# Patient Record
Sex: Female | Born: 1960
Health system: Southern US, Community
[De-identification: ages and names within clinical notes are randomized; demographics above are authoritative.]

## PROBLEM LIST (undated history)

## (undated) DIAGNOSIS — M545 Low back pain, unspecified: Secondary | ICD-10-CM

## (undated) DIAGNOSIS — I10 Essential (primary) hypertension: Secondary | ICD-10-CM

## (undated) DIAGNOSIS — G709 Myoneural disorder, unspecified: Secondary | ICD-10-CM

## (undated) DIAGNOSIS — E669 Obesity, unspecified: Secondary | ICD-10-CM

## (undated) DIAGNOSIS — D649 Anemia, unspecified: Secondary | ICD-10-CM

## (undated) DIAGNOSIS — D219 Benign neoplasm of connective and other soft tissue, unspecified: Secondary | ICD-10-CM

## (undated) DIAGNOSIS — J45909 Unspecified asthma, uncomplicated: Secondary | ICD-10-CM

## (undated) DIAGNOSIS — F419 Anxiety disorder, unspecified: Secondary | ICD-10-CM

## (undated) DIAGNOSIS — G8929 Other chronic pain: Secondary | ICD-10-CM

## (undated) DIAGNOSIS — M1990 Unspecified osteoarthritis, unspecified site: Secondary | ICD-10-CM

## (undated) DIAGNOSIS — E119 Type 2 diabetes mellitus without complications: Secondary | ICD-10-CM

## (undated) HISTORY — DX: Anemia, unspecified: D64.9

## (undated) HISTORY — DX: Low back pain: M54.5

## (undated) HISTORY — DX: Benign neoplasm of connective and other soft tissue, unspecified: D21.9

## (undated) HISTORY — PX: PILONIDAL CYST EXCISION: SHX744

## (undated) HISTORY — PX: WISDOM TOOTH EXTRACTION: SHX21

## (undated) HISTORY — DX: Essential (primary) hypertension: I10

## (undated) HISTORY — DX: Anxiety disorder, unspecified: F41.9

## (undated) HISTORY — DX: Low back pain, unspecified: M54.50

## (undated) HISTORY — DX: Myoneural disorder, unspecified: G70.9

## (undated) HISTORY — DX: Obesity, unspecified: E66.9

## (undated) HISTORY — DX: Unspecified asthma, uncomplicated: J45.909

## (undated) HISTORY — DX: Other chronic pain: G89.29

---

## 1998-10-16 ENCOUNTER — Emergency Department (HOSPITAL_COMMUNITY): Admission: EM | Admit: 1998-10-16 | Discharge: 1998-10-16 | Payer: Self-pay | Admitting: Emergency Medicine

## 2008-07-10 ENCOUNTER — Emergency Department (HOSPITAL_BASED_OUTPATIENT_CLINIC_OR_DEPARTMENT_OTHER): Admission: EM | Admit: 2008-07-10 | Discharge: 2008-07-10 | Payer: Self-pay | Admitting: Emergency Medicine

## 2009-08-20 ENCOUNTER — Ambulatory Visit: Payer: Self-pay | Admitting: Family

## 2009-08-20 DIAGNOSIS — D649 Anemia, unspecified: Secondary | ICD-10-CM | POA: Insufficient documentation

## 2009-08-20 DIAGNOSIS — M65849 Other synovitis and tenosynovitis, unspecified hand: Secondary | ICD-10-CM

## 2009-08-20 DIAGNOSIS — M1710 Unilateral primary osteoarthritis, unspecified knee: Secondary | ICD-10-CM

## 2009-08-20 DIAGNOSIS — N938 Other specified abnormal uterine and vaginal bleeding: Secondary | ICD-10-CM | POA: Insufficient documentation

## 2009-08-20 DIAGNOSIS — M65839 Other synovitis and tenosynovitis, unspecified forearm: Secondary | ICD-10-CM | POA: Insufficient documentation

## 2009-08-20 DIAGNOSIS — N949 Unspecified condition associated with female genital organs and menstrual cycle: Secondary | ICD-10-CM

## 2009-08-20 DIAGNOSIS — E785 Hyperlipidemia, unspecified: Secondary | ICD-10-CM | POA: Insufficient documentation

## 2009-08-20 DIAGNOSIS — IMO0002 Reserved for concepts with insufficient information to code with codable children: Secondary | ICD-10-CM | POA: Insufficient documentation

## 2009-08-20 DIAGNOSIS — J309 Allergic rhinitis, unspecified: Secondary | ICD-10-CM | POA: Insufficient documentation

## 2009-08-20 DIAGNOSIS — J45909 Unspecified asthma, uncomplicated: Secondary | ICD-10-CM | POA: Insufficient documentation

## 2009-08-20 DIAGNOSIS — I10 Essential (primary) hypertension: Secondary | ICD-10-CM | POA: Insufficient documentation

## 2009-08-20 DIAGNOSIS — Z6841 Body Mass Index (BMI) 40.0 and over, adult: Secondary | ICD-10-CM

## 2009-08-27 ENCOUNTER — Ambulatory Visit: Payer: Self-pay | Admitting: Family

## 2009-08-27 ENCOUNTER — Ambulatory Visit (HOSPITAL_BASED_OUTPATIENT_CLINIC_OR_DEPARTMENT_OTHER): Admission: RE | Admit: 2009-08-27 | Discharge: 2009-08-27 | Payer: Self-pay | Admitting: Internal Medicine

## 2009-08-27 ENCOUNTER — Encounter: Payer: Self-pay | Admitting: Internal Medicine

## 2009-08-27 ENCOUNTER — Ambulatory Visit: Payer: Self-pay | Admitting: Diagnostic Radiology

## 2009-08-27 ENCOUNTER — Telehealth: Payer: Self-pay | Admitting: Family

## 2009-08-27 DIAGNOSIS — M79609 Pain in unspecified limb: Secondary | ICD-10-CM | POA: Insufficient documentation

## 2009-08-27 LAB — CONVERTED CEMR LAB
BUN: 13 mg/dL (ref 6–23)
Basophils Absolute: 0 10*3/uL (ref 0.0–0.1)
Basophils Relative: 0 % (ref 0–1)
Calcium: 9 mg/dL (ref 8.4–10.5)
Cholesterol: 210 mg/dL — ABNORMAL HIGH (ref 0–200)
Creatinine, Ser: 0.89 mg/dL (ref 0.40–1.20)
Eosinophils Absolute: 0.1 10*3/uL (ref 0.0–0.7)
Eosinophils Relative: 1 % (ref 0–5)
HCT: 33.1 % — ABNORMAL LOW (ref 36.0–46.0)
HDL: 68 mg/dL (ref >39)
LDL Cholesterol: 123 mg/dL — ABNORMAL HIGH (ref 0–99)
Lymphs Abs: 2.4 10*3/uL (ref 0.7–4.0)
MCV: 68.7 fL — ABNORMAL LOW (ref 78.0–100.0)
Neutrophils Relative %: 47 % (ref 43–77)
Platelets: 284 10*3/uL (ref 150–400)
RDW: 18.4 % — ABNORMAL HIGH (ref 11.5–15.5)
Triglycerides: 96 mg/dL (ref <150)
WBC: 6 10*3/uL (ref 4.0–10.5)

## 2009-08-28 ENCOUNTER — Telehealth: Payer: Self-pay | Admitting: Family

## 2009-08-28 ENCOUNTER — Encounter: Payer: Self-pay | Admitting: Family

## 2009-08-28 DIAGNOSIS — E1149 Type 2 diabetes mellitus with other diabetic neurological complication: Secondary | ICD-10-CM | POA: Insufficient documentation

## 2009-09-22 ENCOUNTER — Telehealth (INDEPENDENT_AMBULATORY_CARE_PROVIDER_SITE_OTHER): Payer: Self-pay | Admitting: *Deleted

## 2012-04-17 ENCOUNTER — Ambulatory Visit (INDEPENDENT_AMBULATORY_CARE_PROVIDER_SITE_OTHER): Payer: Self-pay | Admitting: Internal Medicine

## 2012-04-17 ENCOUNTER — Encounter: Payer: Self-pay | Admitting: Internal Medicine

## 2012-04-17 VITALS — BP 180/80 | HR 96 | Temp 98.8°F | Ht 66.5 in | Wt 276.0 lb

## 2012-04-17 DIAGNOSIS — M25561 Pain in right knee: Secondary | ICD-10-CM | POA: Insufficient documentation

## 2012-04-17 DIAGNOSIS — D649 Anemia, unspecified: Secondary | ICD-10-CM

## 2012-04-17 DIAGNOSIS — M25569 Pain in unspecified knee: Secondary | ICD-10-CM

## 2012-04-17 DIAGNOSIS — M79609 Pain in unspecified limb: Secondary | ICD-10-CM

## 2012-04-17 DIAGNOSIS — R7309 Other abnormal glucose: Secondary | ICD-10-CM

## 2012-04-17 DIAGNOSIS — I10 Essential (primary) hypertension: Secondary | ICD-10-CM

## 2012-04-17 DIAGNOSIS — M79672 Pain in left foot: Secondary | ICD-10-CM

## 2012-04-17 LAB — CBC WITH DIFFERENTIAL/PLATELET
Basophils Absolute: 0 10*3/uL (ref 0.0–0.1)
Basophils Relative: 0 % (ref 0.0–3.0)
Eosinophils Absolute: 0 10*3/uL (ref 0.0–0.7)
Lymphocytes Relative: 40.1 % (ref 12.0–46.0)
MCHC: 31.2 g/dL (ref 30.0–36.0)
Monocytes Relative: 11.6 % (ref 3.0–12.0)
Neutrophils Relative %: 47.7 % (ref 43.0–77.0)
RBC: 4.76 Mil/uL (ref 3.87–5.11)

## 2012-04-17 LAB — BASIC METABOLIC PANEL
BUN: 13 mg/dL (ref 6–23)
CO2: 27 mEq/L (ref 19–32)
Calcium: 9.1 mg/dL (ref 8.4–10.5)
Chloride: 107 mEq/L (ref 96–112)
Creatinine, Ser: 1 mg/dL (ref 0.4–1.2)
Glucose, Bld: 137 mg/dL — ABNORMAL HIGH (ref 70–99)

## 2012-04-17 LAB — LIPID PANEL
Total CHOL/HDL Ratio: 4
Triglycerides: 114 mg/dL (ref 0.0–149.0)

## 2012-04-17 LAB — HEMOGLOBIN A1C: Hgb A1c MFr Bld: 6.6 % — ABNORMAL HIGH (ref 4.6–6.5)

## 2012-04-17 LAB — TSH: TSH: 0.8 u[IU]/mL (ref 0.35–5.50)

## 2012-04-17 MED ORDER — LISINOPRIL 20 MG PO TABS: 20.0000 mg | ORAL_TABLET | Freq: Every day | ORAL | Status: DC

## 2012-04-17 MED ORDER — AMLODIPINE BESYLATE 5 MG PO TABS: 5.0000 mg | ORAL_TABLET | Freq: Every day | ORAL | Status: DC

## 2012-04-17 NOTE — Assessment & Plan Note (Addendum)
Screen for diabetes.  Patient counseled on diet and exercise.

## 2012-04-17 NOTE — Progress Notes (Signed)
Subjective:    Patient ID: Jordan Ramirez, female    DOB: [birth year], 51 y.o.   MRN: [id number]  HPI  51 year old African American female to establish. Patient was last seen by primary care physician while she was living in [place]. She has history of hypertension. She was able to lose approximately 50-60 pounds in the past and blood pressure issues resolved. Over the last 5 years she has moved to [facility] area to care for her elderly parents who have multiple medical issues. Patient has not been taking care of herself and she regained weight.  Patient also reports history of borderline diabetes and past.  She complains of chronic intermittent right knee pain. No previous injury or trauma. Her right knee pain improved with weight loss in the past. She has pain with a long-standing or other weightbearing activity. She has noticed medial left heel pain over last several months.  Review of Systems   Constitutional: Negative for activity change, appetite change. Positive for weight gain Eyes: Negative for visual disturbance.  Respiratory: Negative for cough, chest tightness and shortness of breath.   Cardiovascular: Negative for chest pain.  she seldom exercises Genitourinary: Negative for difficulty urinating.  Neurological: History of tension headaches  Gastrointestinal: Negative for abdominal pain, heartburn melena or hematochezia Psych: Negative for depression or anxiety Endo:  No polyuria or polydypsia     Past Medical History  Diagnosis Date  . Hypertension   . Obesity   . Borderline diabetes mellitus   . Chronic low back pain     History   Social History  . Marital Status: Single    Spouse Name: N/A    Number of Children: N/A  . Years of Education: N/A   Occupational History  . 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 Smoking status: Never Smoker   . Smokeless tobacco: Not on file    . Alcohol Use: No  . Drug Use: No  . Sexually Active: Not on file   Other Topics Concern  . Not on file   Social History Narrative  . No narrative on file    Past Surgical History  Procedure Date  . Wisdom tooth extraction   . Pilonidal cyst excision     Family History  Problem Relation Age of Onset  . Coronary artery disease Mother   . Diabetes type II Mother   . Depression Mother   . Hypertension Mother   . Kidney failure Father     No Known Allergies  Current Outpatient Prescriptions on File Prior to Visit  Medication Sig Dispense Refill  . amLODipine (NORVASC) 5 MG tablet Take 1 tablet (5 mg total) by mouth daily.  30 tablet  3  . lisinopril (PRINIVIL,ZESTRIL) 20 MG tablet Take 1 tablet (20 mg total) by mouth daily.  30 tablet  3    BP 180/80  Pulse 96  Temp 98.8 F (37.1 C) (Oral)  Ht 5' 6.5" (1.689 m)  Wt 276 lb (125.193 kg)  BMI 43.88 kg/m2      Objective:   Physical Exam  Constitutional: She is oriented to person, place, and time.       Pleasant, obese 51 y/o AA female  HENT:  Head: Normocephalic and atraumatic.  Right Ear: External ear normal.  Left Ear: External ear normal.  Mouth/Throat: Oropharynx is clear and moist.  Eyes: Conjunctivae and EOM are normal. Pupils are equal, round, and  reactive to light. No scleral icterus.  Neck: Neck supple.       No carotid bruit acanthosis nigricans   Cardiovascular: Normal rate, regular rhythm, normal heart sounds and intact distal pulses.   Pulmonary/Chest: Effort normal and breath sounds normal. She has no wheezes.  Abdominal: Soft. Bowel sounds are normal. She exhibits no mass. There is no tenderness.  Musculoskeletal: She exhibits no edema.       Mild crepitus with right knee flexion and extension, tenderness of medial aspect of left heel  Lymphadenopathy:    She has no cervical adenopathy.  Neurological: She is alert and oriented to person, place, and time. No cranial nerve deficit.  Skin: Skin  is warm and dry.  Psychiatric: She has a normal mood and affect. Her behavior is normal.          Assessment & Plan:

## 2012-04-17 NOTE — Assessment & Plan Note (Signed)
Heel pain consistent with plantar fasciitis. We reviewed stretching exercises and wearing supportive shoes.

## 2012-04-17 NOTE — Assessment & Plan Note (Addendum)
51 year old [place] American female with severe uncontrolled hypertension. She has associated obesity and history of borderline diabetes. Start lisinopril 20 mg amlodipine 5 mg once daily.  Obtain BMET and FLP.  She understands importance of weight loss.

## 2012-04-17 NOTE — Assessment & Plan Note (Signed)
Pain likely from DJD.  Use ES Tylenol for now.  She has been use aleve for last 3 weeks.

## 2012-04-19 ENCOUNTER — Telehealth: Payer: Self-pay | Admitting: *Deleted

## 2012-04-19 NOTE — Telephone Encounter (Signed)
LMOM with contact name & number for patient call back regarding lab results & further MD instructions/SLS New Rx[s] are pending verification of preferred pharmacy.

## 2012-04-19 NOTE — Telephone Encounter (Signed)
Message copied by Regis Bill on Wed Apr 19, 2012 10:59 AM ------      Message from: Meda Coffee      Created: Tue Apr 18, 2012  1:10 PM       Call pt - blood test shows pt is diabetic.  A1c is 6.6.  I suggest pt start metformin 500 mg one po bid.  # 60  RF x 2.  (pt can start with 1/2 tab bid for 2-3 days to avoid diarrhea side effect).  Plz call in metformin to her pharmacy.  I also suggest pt start cholesterol medication.  LDL should be less than 100.  (Pt's LDL was 118.5 )  Call in pravastatin 40 mg one po qpm  # 30 RF x 2.   I will discuss other lab results at her next followup visit.

## 2012-04-21 ENCOUNTER — Telehealth: Payer: Self-pay | Admitting: Internal Medicine

## 2012-04-21 MED ORDER — METFORMIN HCL 500 MG PO TABS: ORAL_TABLET | ORAL | Status: DC

## 2012-04-21 MED ORDER — PRAVASTATIN SODIUM 40 MG PO TABS: 40.0000 mg | ORAL_TABLET | Freq: Every day | ORAL | Status: DC

## 2012-04-21 NOTE — Telephone Encounter (Signed)
Pt requesting results of labs.

## 2012-04-21 NOTE — Telephone Encounter (Signed)
Pt aware of lab results, scheduled 1 mth f/u and sent rx's electronically to [facility]

## 2012-05-09 ENCOUNTER — Telehealth: Payer: Self-pay | Admitting: *Deleted

## 2012-05-09 NOTE — Progress Notes (Signed)
Left message on machine Call for lab results

## 2012-05-09 NOTE — Telephone Encounter (Signed)
Error

## 2012-05-09 NOTE — Progress Notes (Signed)
Jordan Ramirez had spoken with pt when pt called and asked for lab results

## 2012-05-29 ENCOUNTER — Ambulatory Visit: Payer: Self-pay | Admitting: Internal Medicine

## 2012-05-29 DIAGNOSIS — Z0289 Encounter for other administrative examinations: Secondary | ICD-10-CM

## 2012-07-29 ENCOUNTER — Encounter (HOSPITAL_BASED_OUTPATIENT_CLINIC_OR_DEPARTMENT_OTHER): Payer: Self-pay | Admitting: *Deleted

## 2012-07-29 ENCOUNTER — Emergency Department (HOSPITAL_BASED_OUTPATIENT_CLINIC_OR_DEPARTMENT_OTHER)
Admission: EM | Admit: 2012-07-29 | Discharge: 2012-07-29 | Disposition: A | Discharge status: Home / Self Care | Payer: Self-pay | Attending: Emergency Medicine | Admitting: Emergency Medicine

## 2012-07-29 DIAGNOSIS — S61409A Unspecified open wound of unspecified hand, initial encounter: Secondary | ICD-10-CM | POA: Insufficient documentation

## 2012-07-29 DIAGNOSIS — W268XXA Contact with other sharp object(s), not elsewhere classified, initial encounter: Secondary | ICD-10-CM | POA: Insufficient documentation

## 2012-07-29 DIAGNOSIS — I10 Essential (primary) hypertension: Secondary | ICD-10-CM | POA: Insufficient documentation

## 2012-07-29 DIAGNOSIS — R7309 Other abnormal glucose: Secondary | ICD-10-CM | POA: Insufficient documentation

## 2012-07-29 DIAGNOSIS — S61419A Laceration without foreign body of unspecified hand, initial encounter: Secondary | ICD-10-CM

## 2012-07-29 MED ORDER — LIDOCAINE HCL 2 % IJ SOLN
INTRAMUSCULAR | Status: AC
  Administered 2012-07-29: 14:00:00
  Filled 2012-07-29: qty 20

## 2012-07-29 NOTE — ED Provider Notes (Addendum)
History     CSN: [id number]  Arrival date & time 07/29/12  1235   First MD Initiated Contact with Patient 07/29/12 1254      Chief Complaint  Patient presents with  . Extremity Laceration     HPI Patient has laceration to palm of left hand.  Denies any sensory or motor deficits of the hand or fingers.  Bleeding is controlled.  She cut the hand on a broken glass in while cleaning a glass in her same.  Patient states her tetanus is less than 5 years. Past Medical History  Diagnosis Date  . Hypertension   . Obesity   . Borderline diabetes mellitus   . Chronic low back pain     Past Surgical History  Procedure Date  . Wisdom tooth extraction   . Pilonidal cyst excision     Family History  Problem Relation Age of Onset  . Coronary artery disease Mother   . Diabetes type II Mother   . Depression Mother   . Hypertension Mother   . Kidney failure Father     History  Substance Use Topics  . Smoking status: Never Smoker   . Smokeless tobacco: Not on file  . Alcohol Use: No    OB History    Grav Para Term Preterm Abortions TAB SAB Ect Mult Living                  Review of Systems  All other systems reviewed and are negative.    Allergies  Review of patient's allergies indicates no known allergies.  Home Medications   Current Outpatient Rx  Name Route Sig Dispense Refill  . AMLODIPINE BESYLATE 5 MG PO TABS Oral Take 1 tablet (5 mg total) by mouth daily. 30 tablet 3  . LISINOPRIL 20 MG PO TABS Oral Take 1 tablet (20 mg total) by mouth daily. 30 tablet 3  . METFORMIN HCL 500 MG PO TABS  1/2 tab bid x3 days then 1 tablet po bid 180 tablet 3  . PRAVASTATIN SODIUM 40 MG PO TABS Oral Take 1 tablet (40 mg total) by mouth daily. 90 tablet 3    BP 177/89  Pulse 80  Temp 98.4 F (36.9 C) (Oral)  Resp 20  Ht 5\' 7"  (1.702 m)  Wt 255 lb (115.667 kg)  BMI 39.94 kg/m2  SpO2 99%  Physical Exam  Nursing note and vitals reviewed. Constitutional: She is oriented  to person, place, and time. She appears well-developed. No distress.  HENT:  Head: Normocephalic and atraumatic.  Eyes: Pupils are equal, round, and reactive to light.  Neck: Normal range of motion.  Cardiovascular: Normal rate and intact distal pulses.   Pulmonary/Chest: No respiratory distress.  Abdominal: Normal appearance. She exhibits no distension.  Musculoskeletal:       Left hand: She exhibits laceration (4 cm linear laceration.  No sensation deficit.  No tendon deficit.). normal sensation noted.       Hands:      4 cm linear laceration.  Neurological: She is alert and oriented to person, place, and time. No cranial nerve deficit.  Skin: Skin is warm and dry. No rash noted.  Psychiatric: She has a normal mood and affect. Her behavior is normal.    ED Course  LACERATION REPAIR Date/Time: 07/29/2012 1:24 PM Performed by: Nelia Shi Authorized by: Nelia Shi Consent: Verbal consent obtained. Written consent not obtained. Risks and benefits: risks, benefits and alternatives were discussed Consent given by:  patient Patient understanding: patient states understanding of the procedure being performed Time out: Immediately prior to procedure a "time out" was called to verify the correct patient, procedure, equipment, support staff and site/side marked as required. Body area: trunk Laceration length: 4 cm Foreign bodies: glass Anesthesia: local infiltration Local anesthetic: lidocaine 2% without epinephrine Anesthetic total: 5 ml Irrigation solution: saline Irrigation method: syringe Amount of cleaning: standard Debridement: none Degree of undermining: none Skin closure: 5-0 nylon Number of sutures: 9 Technique: simple Approximation: close Approximation difficulty: simple Dressing: 4x4 sterile gauze, antibiotic ointment and gauze roll Patient tolerance: Patient tolerated the procedure well with no immediate complications.   (including critical care  time)  Labs Reviewed - No data to display No results found.   1. Hand laceration       MDM         Nelia Shi, MD 07/29/12 1327  Nelia Shi, MD 07/29/12 1329

## 2012-07-29 NOTE — ED Notes (Signed)
Pt presents to ED today with lac to right hand from broken drinking glass.

## 2012-08-04 ENCOUNTER — Emergency Department (HOSPITAL_BASED_OUTPATIENT_CLINIC_OR_DEPARTMENT_OTHER)
Admission: EM | Admit: 2012-08-04 | Discharge: 2012-08-04 | Disposition: A | Discharge status: Home / Self Care | Payer: Self-pay | Attending: Emergency Medicine | Admitting: Emergency Medicine

## 2012-08-04 ENCOUNTER — Encounter (HOSPITAL_BASED_OUTPATIENT_CLINIC_OR_DEPARTMENT_OTHER): Payer: Self-pay | Admitting: *Deleted

## 2012-08-04 DIAGNOSIS — Z87891 Personal history of nicotine dependence: Secondary | ICD-10-CM | POA: Insufficient documentation

## 2012-08-04 DIAGNOSIS — I10 Essential (primary) hypertension: Secondary | ICD-10-CM | POA: Insufficient documentation

## 2012-08-04 DIAGNOSIS — E669 Obesity, unspecified: Secondary | ICD-10-CM | POA: Insufficient documentation

## 2012-08-04 DIAGNOSIS — M129 Arthropathy, unspecified: Secondary | ICD-10-CM | POA: Insufficient documentation

## 2012-08-04 DIAGNOSIS — Z4802 Encounter for removal of sutures: Secondary | ICD-10-CM | POA: Insufficient documentation

## 2012-08-04 DIAGNOSIS — G8929 Other chronic pain: Secondary | ICD-10-CM | POA: Insufficient documentation

## 2012-08-04 HISTORY — DX: Unspecified osteoarthritis, unspecified site: M19.90

## 2012-08-04 NOTE — ED Notes (Signed)
Suture removal from left hand from 07/29/12.

## 2012-08-04 NOTE — ED Provider Notes (Signed)
History     CSN: [id number]  Arrival date & time 08/04/12  1207   First MD Initiated Contact with Patient 08/04/12 1218      Chief Complaint  Patient presents with  . Suture / Staple Removal    (Consider location/radiation/quality/duration/timing/severity/associated sxs/prior treatment) HPI Comments: 51 year old female presents the emergency department for removal of sutures from her left hand or placed on October 12 after slicing her hand on the glass nausea/conditions. She had 9 sutures placed, and states she has not had any issues since. Denies discharge, redness or swelling, fever or chills.  Patient is a 51 y.o. female presenting with suture removal. The history is provided by the patient.  Suture / Staple Removal     Past Medical History  Diagnosis Date  . Hypertension   . Obesity   . Borderline diabetes mellitus   . Chronic low back pain   . Arthritis     Past Surgical History  Procedure Date  . Wisdom tooth extraction   . Pilonidal cyst excision     Family History  Problem Relation Age of Onset  . Coronary artery disease Mother   . Diabetes type II Mother   . Depression Mother   . Hypertension Mother   . Kidney failure Father     History  Substance Use Topics  . Smoking status: Former Games developer  . Smokeless tobacco: Not on file  . Alcohol Use: No    OB History    Grav Para Term Preterm Abortions TAB SAB Ect Mult Living                  Review of Systems  Constitutional: Negative for fever and chills.  Musculoskeletal: Negative for arthralgias.  Skin: Negative for color change.  Neurological: Negative for numbness.    Allergies  Review of patient's allergies indicates no known allergies.  Home Medications   Current Outpatient Rx  Name Route Sig Dispense Refill  . AMLODIPINE BESYLATE 5 MG PO TABS Oral Take 1 tablet (5 mg total) by mouth daily. 30 tablet 3  . LISINOPRIL 20 MG PO TABS Oral Take 1 tablet (20 mg total) by mouth daily. 30  tablet 3  . METFORMIN HCL 500 MG PO TABS  1/2 tab bid x3 days then 1 tablet po bid 180 tablet 3  . PRAVASTATIN SODIUM 40 MG PO TABS Oral Take 1 tablet (40 mg total) by mouth daily. 90 tablet 3    BP 169/95  Pulse 91  Temp 98.6 F (37 C) (Oral)  Resp 20  SpO2 99%  LMP 07/05/2012  Physical Exam  Constitutional: She is oriented to person, place, and time. She appears well-developed and well-nourished. No distress.  HENT:  Head: Normocephalic and atraumatic.  Eyes: Conjunctivae normal and EOM are normal.  Neck: Normal range of motion.  Cardiovascular: Normal rate, regular rhythm and normal heart sounds.        Capillary refill < 3 seconds  Pulmonary/Chest: Effort normal and breath sounds normal.  Musculoskeletal: Normal range of motion. She exhibits no edema.  Neurological: She is alert and oriented to person, place, and time. No sensory deficit.  Skin: Skin is warm and dry. Laceration (1 inch healing laceration over thenar eminance of left hand. no surrounding erythema or edema. no discharge. ) noted.  Psychiatric: She has a normal mood and affect. Her behavior is normal.    ED Course  Procedures (including critical care time)  Labs Reviewed - No data to display No results  found.   1. Visit for suture removal       MDM  51 year old female presenting for suture removal. 9 sutures removed without any problem. Steri-Strips placed over the healing laceration. Advised her to watch for any discharge, redness or swelling.        Trevor Mace, PA-C 08/04/12 1248

## 2012-08-04 NOTE — ED Provider Notes (Signed)
Medical screening examination/treatment/procedure(s) were performed by non-physician practitioner and as supervising physician I was immediately available for consultation/collaboration.  John-Adam Bradan Congrove, M.D.      John-Adam Nahjae Hoeg, MD 08/04/12 1654 

## 2012-10-09 NOTE — Telephone Encounter (Signed)
Patient is Dr. Artist Pais patient; taken care of 07.05.13/SLS

## 2012-12-26 ENCOUNTER — Other Ambulatory Visit: Payer: Self-pay | Admitting: Internal Medicine

## 2013-04-22 ENCOUNTER — Other Ambulatory Visit: Payer: Self-pay | Admitting: Internal Medicine

## 2014-07-29 ENCOUNTER — Emergency Department (HOSPITAL_BASED_OUTPATIENT_CLINIC_OR_DEPARTMENT_OTHER)
Admission: EM | Admit: 2014-07-29 | Discharge: 2014-07-29 | Disposition: A | Discharge status: Home / Self Care | Payer: No Typology Code available for payment source | Attending: Emergency Medicine | Admitting: Emergency Medicine

## 2014-07-29 ENCOUNTER — Encounter (HOSPITAL_BASED_OUTPATIENT_CLINIC_OR_DEPARTMENT_OTHER): Payer: Self-pay | Admitting: Emergency Medicine

## 2014-07-29 ENCOUNTER — Emergency Department (HOSPITAL_BASED_OUTPATIENT_CLINIC_OR_DEPARTMENT_OTHER): Payer: No Typology Code available for payment source

## 2014-07-29 DIAGNOSIS — S8991XA Unspecified injury of right lower leg, initial encounter: Secondary | ICD-10-CM | POA: Diagnosis not present

## 2014-07-29 DIAGNOSIS — S46811A Strain of other muscles, fascia and tendons at shoulder and upper arm level, right arm, initial encounter: Secondary | ICD-10-CM | POA: Diagnosis not present

## 2014-07-29 DIAGNOSIS — M1990 Unspecified osteoarthritis, unspecified site: Secondary | ICD-10-CM | POA: Insufficient documentation

## 2014-07-29 DIAGNOSIS — E669 Obesity, unspecified: Secondary | ICD-10-CM | POA: Diagnosis not present

## 2014-07-29 DIAGNOSIS — S199XXA Unspecified injury of neck, initial encounter: Secondary | ICD-10-CM | POA: Insufficient documentation

## 2014-07-29 DIAGNOSIS — M25561 Pain in right knee: Secondary | ICD-10-CM

## 2014-07-29 DIAGNOSIS — M25562 Pain in left knee: Secondary | ICD-10-CM

## 2014-07-29 DIAGNOSIS — S4991XA Unspecified injury of right shoulder and upper arm, initial encounter: Secondary | ICD-10-CM | POA: Diagnosis present

## 2014-07-29 DIAGNOSIS — Y9389 Activity, other specified: Secondary | ICD-10-CM | POA: Insufficient documentation

## 2014-07-29 DIAGNOSIS — Z87891 Personal history of nicotine dependence: Secondary | ICD-10-CM | POA: Insufficient documentation

## 2014-07-29 DIAGNOSIS — S299XXA Unspecified injury of thorax, initial encounter: Secondary | ICD-10-CM | POA: Diagnosis not present

## 2014-07-29 DIAGNOSIS — S8992XA Unspecified injury of left lower leg, initial encounter: Secondary | ICD-10-CM | POA: Diagnosis not present

## 2014-07-29 DIAGNOSIS — I10 Essential (primary) hypertension: Secondary | ICD-10-CM | POA: Insufficient documentation

## 2014-07-29 DIAGNOSIS — G8929 Other chronic pain: Secondary | ICD-10-CM | POA: Diagnosis not present

## 2014-07-29 DIAGNOSIS — E119 Type 2 diabetes mellitus without complications: Secondary | ICD-10-CM | POA: Insufficient documentation

## 2014-07-29 DIAGNOSIS — Y92410 Unspecified street and highway as the place of occurrence of the external cause: Secondary | ICD-10-CM | POA: Diagnosis not present

## 2014-07-29 HISTORY — DX: Type 2 diabetes mellitus without complications: E11.9

## 2014-07-29 MED ORDER — CYCLOBENZAPRINE HCL 10 MG PO TABS: 10.0000 mg | ORAL_TABLET | Freq: Three times a day (TID) | ORAL | Status: DC | PRN

## 2014-07-29 MED ORDER — IBUPROFEN 800 MG PO TABS: 800.0000 mg | ORAL_TABLET | Freq: Three times a day (TID) | ORAL | Status: DC
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
## 2014-07-29 NOTE — ED Provider Notes (Signed)
Medical screening examination/treatment/procedure(s) were performed by non-physician practitioner and as supervising physician I was immediately available for consultation/collaboration.   Ernestina Patches, MD 07/29/14 1536

## 2014-08-15 ENCOUNTER — Encounter: Payer: Self-pay | Admitting: Family Medicine

## 2014-08-15 NOTE — Progress Notes (Signed)
Error   This encounter was created in error - please disregard. 

## 2014-08-26 ENCOUNTER — Encounter: Payer: Self-pay | Admitting: Family Medicine

## 2014-08-26 ENCOUNTER — Ambulatory Visit (INDEPENDENT_AMBULATORY_CARE_PROVIDER_SITE_OTHER): Payer: Self-pay | Admitting: Family Medicine

## 2014-08-26 VITALS — BP 168/92 | HR 88 | Temp 98.3°F | Ht 67.0 in | Wt 270.8 lb

## 2014-08-26 DIAGNOSIS — M545 Low back pain: Secondary | ICD-10-CM

## 2014-08-26 DIAGNOSIS — M25561 Pain in right knee: Secondary | ICD-10-CM

## 2014-08-26 DIAGNOSIS — I10 Essential (primary) hypertension: Secondary | ICD-10-CM

## 2014-08-26 DIAGNOSIS — G8929 Other chronic pain: Secondary | ICD-10-CM

## 2014-08-26 DIAGNOSIS — E119 Type 2 diabetes mellitus without complications: Secondary | ICD-10-CM

## 2014-08-26 DIAGNOSIS — E785 Hyperlipidemia, unspecified: Secondary | ICD-10-CM

## 2014-08-26 MED ORDER — LISINOPRIL 20 MG PO TABS: 20.0000 mg | ORAL_TABLET | Freq: Every day | ORAL | Status: DC

## 2014-08-26 MED ORDER — METFORMIN HCL 500 MG PO TABS: ORAL_TABLET | ORAL | Status: DC

## 2014-08-26 MED ORDER — PRAVASTATIN SODIUM 40 MG PO TABS: 40.0000 mg | ORAL_TABLET | Freq: Every day | ORAL | Status: DC

## 2014-08-26 NOTE — Progress Notes (Signed)
Pre visit review using our clinic review tool, if applicable. No additional management support is needed unless otherwise documented below in the visit note. 
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
## 2014-08-26 NOTE — Patient Instructions (Signed)
-  We placed a referral for you as discussed to the orthopedic office. It usually takes about 1-2 weeks to process and schedule this referral. If you have not heard from [place] regarding this appointment in 2 weeks please contact our office.  -please restart all of your medications and take every day  BEFORE YOU LEAVE: -schedule a follow up appointment with Dr. Shawna Orleans in the next 3 months

## 2014-08-27 ENCOUNTER — Telehealth: Payer: Self-pay | Admitting: Internal Medicine

## 2014-08-27 NOTE — Telephone Encounter (Signed)
emmi emailed °

## 2014-09-11 ENCOUNTER — Emergency Department (HOSPITAL_BASED_OUTPATIENT_CLINIC_OR_DEPARTMENT_OTHER)
Admission: EM | Admit: 2014-09-11 | Discharge: 2014-09-11 | Disposition: A | Discharge status: Home / Self Care | Payer: No Typology Code available for payment source | Attending: Emergency Medicine | Admitting: Emergency Medicine

## 2014-09-11 ENCOUNTER — Encounter (HOSPITAL_BASED_OUTPATIENT_CLINIC_OR_DEPARTMENT_OTHER): Payer: Self-pay

## 2014-09-11 DIAGNOSIS — G8929 Other chronic pain: Secondary | ICD-10-CM | POA: Insufficient documentation

## 2014-09-11 DIAGNOSIS — Z79899 Other long term (current) drug therapy: Secondary | ICD-10-CM | POA: Insufficient documentation

## 2014-09-11 DIAGNOSIS — I10 Essential (primary) hypertension: Secondary | ICD-10-CM | POA: Insufficient documentation

## 2014-09-11 DIAGNOSIS — M170 Bilateral primary osteoarthritis of knee: Secondary | ICD-10-CM

## 2014-09-11 DIAGNOSIS — M25562 Pain in left knee: Secondary | ICD-10-CM | POA: Insufficient documentation

## 2014-09-11 DIAGNOSIS — M1711 Unilateral primary osteoarthritis, right knee: Secondary | ICD-10-CM | POA: Insufficient documentation

## 2014-09-11 DIAGNOSIS — Z87891 Personal history of nicotine dependence: Secondary | ICD-10-CM | POA: Insufficient documentation

## 2014-09-11 DIAGNOSIS — E669 Obesity, unspecified: Secondary | ICD-10-CM | POA: Insufficient documentation

## 2014-09-11 DIAGNOSIS — M1712 Unilateral primary osteoarthritis, left knee: Secondary | ICD-10-CM | POA: Insufficient documentation

## 2014-09-11 DIAGNOSIS — E119 Type 2 diabetes mellitus without complications: Secondary | ICD-10-CM | POA: Insufficient documentation

## 2014-09-11 MED ORDER — HYDROCODONE-ACETAMINOPHEN 5-325 MG PO TABS: 1.0000 | ORAL_TABLET | ORAL | Status: DC | PRN

## 2014-09-11 MED ORDER — NAPROXEN 500 MG PO TABS: 500.0000 mg | ORAL_TABLET | Freq: Two times a day (BID) | ORAL | Status: DC

## 2014-09-11 NOTE — ED Notes (Signed)
MD at bedside. 
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
## 2014-09-11 NOTE — Discharge Instructions (Signed)
Arthritis, Nonspecific °Arthritis is inflammation of a joint. This usually means pain, redness, warmth or swelling are present. One or more joints may be involved. There are a number of types of arthritis. Your caregiver may not be able to tell what type of arthritis you have right away. °CAUSES  °The most common cause of arthritis is the wear and tear on the joint (osteoarthritis). This causes damage to the cartilage, which can break down over time. The knees, hips, back and neck are most often affected by this type of arthritis. °Other types of arthritis and common causes of joint pain include: °· Sprains and other injuries near the joint. Sometimes minor sprains and injuries cause pain and swelling that develop hours later. °· Rheumatoid arthritis. This affects hands, feet and knees. It usually affects both sides of your body at the same time. It is often associated with chronic ailments, fever, weight loss and general weakness. °· Crystal arthritis. Gout and pseudo gout can cause occasional acute severe pain, redness and swelling in the foot, ankle, or knee. °· Infectious arthritis. Bacteria can get into a joint through a break in overlying skin. This can cause infection of the joint. Bacteria and viruses can also spread through the blood and affect your joints. °· Drug, infectious and allergy reactions. Sometimes joints can become mildly painful and slightly swollen with these types of illnesses. °SYMPTOMS  °· Pain is the main symptom. °· Your joint or joints can also be red, swollen and warm or hot to the touch. °· You may have a fever with certain types of arthritis, or even feel overall ill. °· The joint with arthritis will hurt with movement. Stiffness is present with some types of arthritis. °DIAGNOSIS  °Your caregiver will suspect arthritis based on your description of your symptoms and on your exam. Testing may be needed to find the type of arthritis: °· Blood and sometimes urine tests. °· X-ray tests  and sometimes CT or MRI scans. °· Removal of fluid from the joint (arthrocentesis) is done to check for bacteria, crystals or other causes. Your caregiver (or a specialist) will numb the area over the joint with a local anesthetic, and use a needle to remove joint fluid for examination. This procedure is only minimally uncomfortable. °· Even with these tests, your caregiver may not be able to tell what kind of arthritis you have. Consultation with a specialist (rheumatologist) may be helpful. °TREATMENT  °Your caregiver will discuss with you treatment specific to your type of arthritis. If the specific type cannot be determined, then the following general recommendations may apply. °Treatment of severe joint pain includes: °· Rest. °· Elevation. °· Anti-inflammatory medication (for example, ibuprofen) may be prescribed. Avoiding activities that cause increased pain. °· Only take over-the-counter or prescription medicines for pain and discomfort as recommended by your caregiver. °· Cold packs over an inflamed joint may be used for 10 to 15 minutes every hour. Hot packs sometimes feel better, but do not use overnight. Do not use hot packs if you are diabetic without your caregiver's permission. °· A cortisone shot into arthritic joints may help reduce pain and swelling. °· Any acute arthritis that gets worse over the next 1 to 2 days needs to be looked at to be sure there is no joint infection. °Long-term arthritis treatment involves modifying activities and lifestyle to reduce joint stress jarring. This can include weight loss. Also, exercise is needed to nourish the joint cartilage and remove waste. This helps keep the muscles   around the joint strong. °HOME CARE INSTRUCTIONS  °· Do not take aspirin to relieve pain if gout is suspected. This elevates uric acid levels. °· Only take over-the-counter or prescription medicines for pain, discomfort or fever as directed by your caregiver. °· Rest the joint as much as  possible. °· If your joint is swollen, keep it elevated. °· Use crutches if the painful joint is in your leg. °· Drinking plenty of fluids may help for certain types of arthritis. °· Follow your caregiver's dietary instructions. °· Try low-impact exercise such as: °¨ Swimming. °¨ Water aerobics. °¨ Biking. °¨ Walking. °· Morning stiffness is often relieved by a warm shower. °· Put your joints through regular range-of-motion. °SEEK MEDICAL CARE IF:  °· You do not feel better in 24 hours or are getting worse. °· You have side effects to medications, or are not getting better with treatment. °SEEK IMMEDIATE MEDICAL CARE IF:  °· You have a fever. °· You develop severe joint pain, swelling or redness. °· Many joints are involved and become painful and swollen. °· There is severe back pain and/or leg weakness. °· You have loss of bowel or bladder control. °Document Released: 11/11/2004 Document Revised: 12/27/2011 Document Reviewed: 11/27/2008 °ExitCare® Patient Information ©2015 [employer]. This information is not intended to replace advice given to you by your health care provider. Make sure you discuss any questions you have with your health care provider. ° °

## 2014-09-11 NOTE — ED Notes (Signed)
Pt reports continued bilateral knee pain since October.
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
## 2014-09-30 ENCOUNTER — Encounter: Payer: Self-pay | Admitting: Internal Medicine

## 2014-09-30 ENCOUNTER — Ambulatory Visit (INDEPENDENT_AMBULATORY_CARE_PROVIDER_SITE_OTHER): Payer: Self-pay | Admitting: Internal Medicine

## 2014-09-30 VITALS — BP 154/102 | HR 84 | Temp 99.1°F | Ht 67.0 in | Wt 273.0 lb

## 2014-09-30 DIAGNOSIS — E119 Type 2 diabetes mellitus without complications: Secondary | ICD-10-CM

## 2014-09-30 DIAGNOSIS — M1712 Unilateral primary osteoarthritis, left knee: Secondary | ICD-10-CM

## 2014-09-30 DIAGNOSIS — I10 Essential (primary) hypertension: Secondary | ICD-10-CM

## 2014-09-30 DIAGNOSIS — E785 Hyperlipidemia, unspecified: Secondary | ICD-10-CM

## 2014-09-30 DIAGNOSIS — Z0000 Encounter for general adult medical examination without abnormal findings: Secondary | ICD-10-CM

## 2014-09-30 DIAGNOSIS — E1169 Type 2 diabetes mellitus with other specified complication: Secondary | ICD-10-CM

## 2014-09-30 DIAGNOSIS — M25562 Pain in left knee: Secondary | ICD-10-CM

## 2014-09-30 DIAGNOSIS — M25561 Pain in right knee: Secondary | ICD-10-CM

## 2014-09-30 DIAGNOSIS — E669 Obesity, unspecified: Secondary | ICD-10-CM

## 2014-09-30 MED ORDER — NAPROXEN 500 MG PO TABS: 500.0000 mg | ORAL_TABLET | Freq: Two times a day (BID) | ORAL | Status: DC

## 2014-09-30 MED ORDER — LISINOPRIL 20 MG PO TABS: 20.0000 mg | ORAL_TABLET | Freq: Every day | ORAL | Status: DC

## 2014-09-30 MED ORDER — PRAVASTATIN SODIUM 40 MG PO TABS: 40.0000 mg | ORAL_TABLET | Freq: Every day | ORAL | Status: DC

## 2014-09-30 MED ORDER — HYDROCODONE-ACETAMINOPHEN 5-325 MG PO TABS: 1.0000 | ORAL_TABLET | ORAL | Status: DC | PRN

## 2014-09-30 MED ORDER — AMLODIPINE BESYLATE 5 MG PO TABS: 5.0000 mg | ORAL_TABLET | Freq: Every day | ORAL | Status: DC

## 2014-09-30 MED ORDER — METFORMIN HCL ER 500 MG PO TB24: 500.0000 mg | ORAL_TABLET | Freq: Two times a day (BID) | ORAL | Status: DC

## 2014-09-30 NOTE — Progress Notes (Signed)
Pre visit review using our clinic review tool, if applicable. No additional management support is needed unless otherwise documented below in the visit note. 
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
## 2014-09-30 NOTE — Assessment & Plan Note (Addendum)
Patient having difficulty tolerating metformin 500 mg twice daily secondary to diarrhea. Switch to metformin ER 500 mg twice daily.  Monitor A1c.

## 2014-09-30 NOTE — Assessment & Plan Note (Signed)
Patient's blood pressure is suboptimally controlled. Continue same dose of lisinopril. Restart amlodipine 5 mg. Monitor electrolytes and kidney function. BP: (!) 154/102 mmHg
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
## 2014-10-01 ENCOUNTER — Telehealth: Payer: Self-pay | Admitting: Internal Medicine

## 2014-10-01 LAB — HEMOGLOBIN A1C: Hgb A1c MFr Bld: 6.9 % — ABNORMAL HIGH (ref 4.6–6.5)

## 2014-10-01 LAB — HEPATIC FUNCTION PANEL
ALBUMIN: 3.8 g/dL (ref 3.5–5.2)
ALT: 15 U/L (ref 0–35)
AST: 19 U/L (ref 0–37)
Alkaline Phosphatase: 71 U/L (ref 39–117)
BILIRUBIN TOTAL: 0.4 mg/dL (ref 0.2–1.2)
Bilirubin, Direct: 0 mg/dL (ref 0.0–0.3)
TOTAL PROTEIN: 7.4 g/dL (ref 6.0–8.3)

## 2014-10-01 LAB — TSH: TSH: 1.17 u[IU]/mL (ref 0.35–4.50)

## 2014-10-01 LAB — LIPID PANEL
CHOLESTEROL: 160 mg/dL (ref 0–200)
HDL: 55.5 mg/dL (ref >39.00)
LDL Cholesterol: 85 mg/dL (ref 0–99)
NonHDL: 104.5
TRIGLYCERIDES: 99 mg/dL (ref 0.0–149.0)
Total CHOL/HDL Ratio: 3
VLDL: 19.8 mg/dL (ref 0.0–40.0)

## 2014-10-01 LAB — BASIC METABOLIC PANEL
BUN: 16 mg/dL (ref 6–23)
CALCIUM: 9.1 mg/dL (ref 8.4–10.5)
CO2: 25 meq/L (ref 19–32)
CREATININE: 0.8 mg/dL (ref 0.4–1.2)
Chloride: 107 mEq/L (ref 96–112)
GFR: 90.64 mL/min (ref >60.00)
Glucose, Bld: 103 mg/dL — ABNORMAL HIGH (ref 70–99)
Potassium: 3.7 mEq/L (ref 3.5–5.1)
Sodium: 138 mEq/L (ref 135–145)

## 2014-10-01 LAB — HIV ANTIBODY (ROUTINE TESTING W REFLEX): HIV: NONREACTIVE

## 2014-10-01 NOTE — Telephone Encounter (Signed)
emmi emailed °

## 2014-10-07 ENCOUNTER — Telehealth: Payer: Self-pay | Admitting: Internal Medicine

## 2014-10-07 NOTE — Telephone Encounter (Signed)
Pt would like blood work results °

## 2014-10-07 NOTE — Telephone Encounter (Signed)
I released blood test results to MyChart

## 2014-10-08 ENCOUNTER — Telehealth: Payer: Self-pay | Admitting: Internal Medicine

## 2014-10-08 NOTE — Telephone Encounter (Signed)
Pt will check her mychart account for labs.  However, she is requesting an antibiotic.  No fever, productive cough yellow phelgm,  She has tried delsym, alka seltzer, Corcidine hbp otc, vitamin c, echinacea supplement.  Nothing has helped.  Please advise

## 2014-10-08 NOTE — Telephone Encounter (Signed)
Pt called said she has a very bad cold and is requesting an antibiotic and cough syrup. Jenny Reichmann she also said she is waiting on a call about her lab work..    Pharmacy [facility]

## 2014-10-09 NOTE — Telephone Encounter (Signed)
I am sorry but that was not discussed at our last OV.  I suggest she come in for OV within another provider

## 2014-10-15 NOTE — Telephone Encounter (Signed)
Left message for pt to call back  °

## 2014-10-15 NOTE — Telephone Encounter (Signed)
Pt is feeling some better.  She will wait till next week and see how she feels since Dr Shawna Orleans is not here this week.  If no better, she will call back.  Nothing further is needed

## 2014-10-25 ENCOUNTER — Ambulatory Visit: Payer: Self-pay | Admitting: Family Medicine

## 2014-11-05 ENCOUNTER — Ambulatory Visit: Payer: Self-pay | Admitting: Family Medicine

## 2014-11-13 ENCOUNTER — Ambulatory Visit: Payer: Self-pay | Admitting: Family Medicine

## 2015-03-05 ENCOUNTER — Telehealth: Payer: Self-pay

## 2015-03-05 DIAGNOSIS — Z1239 Encounter for other screening for malignant neoplasm of breast: Secondary | ICD-10-CM

## 2015-03-05 NOTE — Telephone Encounter (Signed)
Mammogram ordered. Pt will schedule

## 2015-10-01 ENCOUNTER — Other Ambulatory Visit: Payer: Self-pay | Admitting: Internal Medicine

## 2015-10-02 ENCOUNTER — Other Ambulatory Visit: Payer: Self-pay | Admitting: Family Medicine

## 2015-10-02 MED ORDER — METFORMIN HCL ER 500 MG PO TB24: 500.0000 mg | ORAL_TABLET | Freq: Two times a day (BID) | ORAL | Status: DC

## 2016-01-10 ENCOUNTER — Other Ambulatory Visit: Payer: Self-pay | Admitting: Internal Medicine

## 2016-04-27 ENCOUNTER — Other Ambulatory Visit: Payer: Self-pay | Admitting: Internal Medicine

## 2016-05-10 ENCOUNTER — Other Ambulatory Visit: Payer: Self-pay | Admitting: Internal Medicine

## 2016-05-11 ENCOUNTER — Other Ambulatory Visit: Payer: Self-pay

## 2016-05-11 MED ORDER — LISINOPRIL 20 MG PO TABS: 20.0000 mg | ORAL_TABLET | Freq: Every day | ORAL | 0 refills | Status: DC

## 2016-05-11 MED ORDER — METFORMIN HCL ER 500 MG PO TB24: 500.0000 mg | ORAL_TABLET | Freq: Two times a day (BID) | ORAL | 0 refills | Status: DC

## 2016-05-11 NOTE — Telephone Encounter (Signed)
Patient has not been since since 09/30/14. Patient has an appt with you on Friday for Knee Pain. No appt has been scheduled to establish with new PCP. Patient needs refills on Norvasc, Metformin, and Lisinopril. Ok to refill these medications?

## 2016-05-11 NOTE — Telephone Encounter (Signed)
Ok to refill for one month  

## 2016-05-14 ENCOUNTER — Encounter: Payer: Self-pay | Admitting: Adult Health

## 2016-05-14 ENCOUNTER — Ambulatory Visit (INDEPENDENT_AMBULATORY_CARE_PROVIDER_SITE_OTHER): Payer: Self-pay | Admitting: Adult Health

## 2016-05-14 VITALS — BP 138/80 | Temp 98.2°F | Ht 67.0 in | Wt 273.8 lb

## 2016-05-14 DIAGNOSIS — G8929 Other chronic pain: Secondary | ICD-10-CM

## 2016-05-14 DIAGNOSIS — E119 Type 2 diabetes mellitus without complications: Secondary | ICD-10-CM

## 2016-05-14 DIAGNOSIS — M25569 Pain in unspecified knee: Secondary | ICD-10-CM

## 2016-05-14 DIAGNOSIS — E1169 Type 2 diabetes mellitus with other specified complication: Secondary | ICD-10-CM

## 2016-05-14 DIAGNOSIS — E669 Obesity, unspecified: Secondary | ICD-10-CM

## 2016-05-14 LAB — POCT GLYCOSYLATED HEMOGLOBIN (HGB A1C): Hemoglobin A1C: 6.7

## 2016-05-14 MED ORDER — OXYCODONE-ACETAMINOPHEN 10-325 MG PO TABS: 1.0000 | ORAL_TABLET | Freq: Four times a day (QID) | ORAL | 0 refills | Status: DC | PRN

## 2016-05-14 MED ORDER — MELOXICAM 15 MG PO TABS: 15.0000 mg | ORAL_TABLET | Freq: Every day | ORAL | 3 refills | Status: DC

## 2016-05-14 NOTE — Progress Notes (Signed)
Subjective:    Patient ID: Jordan Ramirez, female    DOB: 12/22/1960, 55 y.o.   MRN: [id number]  HPI  55 year old female who presents to the office today for chronic pain in bilateral knees, more so in the right knee. She reports worsening pain since being seen by a trainer and was working out on a stationary bike. The pain is worse with weight bearing and certain movements. Today she has no pain.   She would like to have further evaluation done.   She would also like to know what her A1c is  LEFT KNEE - COMPLETE 4+ VIEW  COMPARISON:  None.  FINDINGS: Small suprapatellar joint effusion. Tricompartmental osteophytosis. Severe patellofemoral joint involvement. Moderate to severe medial compartment involvement. Patella intact. No acute fracture or dislocation identified.  LEFT KNEE - COMPLETE 4+ VIEW  COMPARISON:  None.  FINDINGS: Small suprapatellar joint effusion. Tricompartmental osteophytosis. Severe patellofemoral joint involvement. Moderate to severe medial compartment involvement. Patella intact. No acute fracture or dislocation identified.  Review of Systems  Constitutional: Negative.   Respiratory: Negative.   Cardiovascular: Negative.   Gastrointestinal: Negative.   Genitourinary: Negative.   Musculoskeletal: Negative.   Neurological: Negative.   All other systems reviewed and are negative.  Past Medical History:  Diagnosis Date  . Arthritis   . Chronic low back pain   . Diabetes mellitus without complication ([place])   . Hypertension   . Obesity     Social History   Social History  . Marital status: Single    Spouse name: N/A  . Number of children: N/A  . Years of education: N/A   Occupational History  . 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 Smoking status: Former Research scientist (life sciences)  . Smokeless tobacco: Not on file  . Alcohol use No  . Drug use: No  . Sexual activity: Not  on file   Other Topics Concern  . Not on file   Social History Narrative  . No narrative on file    Past Surgical History:  Procedure Laterality Date  . PILONIDAL CYST EXCISION    . WISDOM TOOTH EXTRACTION      Family History  Problem Relation Age of Onset  . Coronary artery disease Mother   . Diabetes type II Mother   . Depression Mother   . Hypertension Mother   . Kidney failure Father     No Known Allergies  Current Outpatient Prescriptions on File Prior to Visit  Medication Sig Dispense Refill  . amLODipine (NORVASC) 5 MG tablet TAKE ONE TABLET BY MOUTH ONE TIME DAILY 30 tablet 0  . lisinopril (PRINIVIL,ZESTRIL) 20 MG tablet Take 1 tablet (20 mg total) by mouth daily. **EXTABLISH WITH NEW PCP FOR FURTHER REFILLS** 30 tablet 0  . metFORMIN (GLUCOPHAGE-XR) 500 MG 24 hr tablet Take 1 tablet (500 mg total) by mouth 2 (two) times daily with a meal. **MUST ESTABLISH WITH NEW PCP FOR FURTHER REFILLS** 60 tablet 0   No current facility-administered medications on file prior to visit.     BP 138/80   Temp 98.2 F (36.8 C) (Oral)   Ht 5\' 7"  (1.702 m)   Wt 273 lb 12.8 oz (124.2 kg)   BMI 42.88 kg/m       Objective:   Physical Exam  Constitutional: She is oriented to person, place, and time. She appears well-developed and well-nourished. No distress.  Cardiovascular: Normal  rate, regular rhythm, normal heart sounds and intact distal pulses.  Exam reveals no gallop and no friction rub.   No murmur heard. Pulmonary/Chest: Effort normal and breath sounds normal. No respiratory distress. She has no wheezes. She has no rales. She exhibits no tenderness.  Musculoskeletal: Normal range of motion. She exhibits no edema or tenderness.  Neurological: She is alert and oriented to person, place, and time.  Skin: Skin is warm and dry. No rash noted. She is not diaphoretic. No erythema. No pallor.  Psychiatric: She has a normal mood and affect. Her behavior is normal. Judgment and  thought content normal.  Nursing note and vitals reviewed.     Assessment & Plan:  1. Knee pain, chronic, unspecified laterality - AMB referral to orthopedics - meloxicam (MOBIC) 15 MG tablet; Take 1 tablet (15 mg total) by mouth daily.  Dispense: 30 tablet; Refill: 3 - oxyCODONE-acetaminophen (PERCOCET) 10-325 MG tablet; Take 1 tablet by mouth every 6 (six) hours as needed for pain.  Dispense: 30 tablet; Refill: 0 - Continue with low contact exercises as tolerated.  - Follow up as needed  2. Diabetes mellitus type 2 in obese ([facility]) - POC HgB A1c- 6.7     Dorothyann Peng, NP

## 2016-05-14 NOTE — Patient Instructions (Addendum)
It was grear seeing you today   Your A1c today is 6.7  I have sent in a prescription for Mobic 15mg  daily  Use the Percocet for breakthrough pain   Someone from orthopedics will call you to schedule an appointment.   Follow up with me this fall for your physical exam

## 2016-06-10 ENCOUNTER — Other Ambulatory Visit: Payer: Self-pay | Admitting: Adult Health

## 2016-06-10 NOTE — Telephone Encounter (Signed)
Rx refill sent to pharmacy. 

## 2016-10-20 ENCOUNTER — Encounter: Payer: Self-pay | Admitting: Adult Health

## 2016-10-20 ENCOUNTER — Telehealth: Payer: Self-pay | Admitting: Internal Medicine

## 2016-10-20 ENCOUNTER — Ambulatory Visit (INDEPENDENT_AMBULATORY_CARE_PROVIDER_SITE_OTHER): Payer: BLUE CROSS/BLUE SHIELD | Admitting: Adult Health

## 2016-10-20 VITALS — BP 172/82 | Ht 67.0 in | Wt 281.1 lb

## 2016-10-20 DIAGNOSIS — M25561 Pain in right knee: Secondary | ICD-10-CM | POA: Diagnosis not present

## 2016-10-20 DIAGNOSIS — I10 Essential (primary) hypertension: Secondary | ICD-10-CM

## 2016-10-20 MED ORDER — AMLODIPINE BESYLATE 5 MG PO TABS: 5.0000 mg | ORAL_TABLET | Freq: Every day | ORAL | 0 refills | Status: DC

## 2016-10-20 MED ORDER — LISINOPRIL 20 MG PO TABS: 20.0000 mg | ORAL_TABLET | Freq: Every day | ORAL | 0 refills | Status: DC

## 2016-10-20 NOTE — Telephone Encounter (Signed)
Error

## 2016-10-20 NOTE — Telephone Encounter (Signed)
° °  Pt saw Tommi Rumps today and asked for a note for her personal trainer. [place]  She said she discussed this info with him today at her appt.  Pt is also asking for a doctor note to give to the city stating she and her Mom have knee issues and are not able to take  the garbage cans to the street. She is asking if the notes can be emailed to her at the below  email address    [email or web address]

## 2016-10-20 NOTE — Progress Notes (Signed)
Subjective:    Patient ID: Jordan Ramirez, female    DOB: 26-Sep-1961, 56 y.o.   MRN: [id number]  HPI  56 year old female who  has a past medical history of Arthritis; Chronic low back pain; Diabetes mellitus without complication ([place]); Hypertension; and Obesity. She presents to the office today after being seen in [facility] on 10/03/2016 s/p fall in bathtub injuring her right knee. X rays of lumbar spine, right knee and hip/pelvis were negative for fracture. She continues to have pain and swelling. Pain is located on the medial aspect of right knee and does not radiate. She feels as though it is swollen. Pain is with certain movements and walking.   She has not been using any over the counter medications.    She also needs a refill on her antihypertensive medications as she has been out.     Review of Systems  Constitutional: Positive for activity change.  Respiratory: Negative.   Cardiovascular: Negative.   Musculoskeletal: Positive for arthralgias, gait problem and joint swelling.  All other systems reviewed and are negative.  Past Medical History:  Diagnosis Date  . Arthritis   . Chronic low back pain   . Diabetes mellitus without complication ([place])   . Hypertension   . Obesity     Social History   Social History  . Marital status: Single    Spouse name: N/A  . Number of children: N/A  . Years of education: N/A   Occupational History  . 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 Smoking status: Former Research scientist (life sciences)  . Smokeless tobacco: Not on file  . Alcohol use No  . Drug use: No  . Sexual activity: Not on file   Other Topics Concern  . Not on file   Social History Narrative  . No narrative on file    Past Surgical History:  Procedure Laterality Date  . PILONIDAL CYST EXCISION    . WISDOM TOOTH EXTRACTION      Family History  Problem Relation Age of Onset  . Coronary artery disease  Mother   . Diabetes type II Mother   . Depression Mother   . Hypertension Mother   . Kidney failure Father     No Known Allergies  Current Outpatient Prescriptions on File Prior to Visit  Medication Sig Dispense Refill  . meloxicam (MOBIC) 15 MG tablet Take 1 tablet (15 mg total) by mouth daily. 30 tablet 3  . metFORMIN (GLUCOPHAGE-XR) 500 MG 24 hr tablet Take 1 tablet (500 mg total) by mouth 2 (two) times daily with a meal. **MUST ESTABLISH WITH NEW PCP FOR FURTHER REFILLS** 60 tablet 0  . oxyCODONE-acetaminophen (PERCOCET) 10-325 MG tablet Take 1 tablet by mouth every 6 (six) hours as needed for pain. 30 tablet 0   No current facility-administered medications on file prior to visit.     BP (!) 172/82   Ht 5\' 7"  (1.702 m)   Wt 281 lb 1.6 oz (127.5 kg)   BMI 44.03 kg/m       Objective:   Physical Exam  Constitutional: She is oriented to person, place, and time. She appears well-developed and well-nourished. No distress.  Cardiovascular: Normal rate, regular rhythm, normal heart sounds and intact distal pulses.  Exam reveals no friction rub.   No murmur heard. Pulmonary/Chest: Effort normal and breath sounds normal. No respiratory distress. She has no wheezes. She has  no rales. She exhibits no tenderness.  Musculoskeletal:       Right knee: She exhibits decreased range of motion and swelling. She exhibits no effusion, no deformity, no erythema, no bony tenderness, normal meniscus and no MCL laxity. Tenderness found. Medial joint line and MCL tenderness noted. No LCL and no patellar tendon tenderness noted.  Pain to medial aspect of right knee with knee to chest movement. No pain with straight leg raise, or internal/external rotation.  - Lachman test - negative   Neurological: She is alert and oriented to person, place, and time.  Skin: Skin is warm and dry. No rash noted. She is not diaphoretic. No erythema. No pallor.  Psychiatric: She has a normal mood and affect. Her behavior  is normal. Judgment normal.  Nursing note and vitals reviewed.     Assessment & Plan:  1. Acute pain of right knee - Advised Motrin, rest and ice  - Ambulatory referral to Sports Medicine  2. Essential hypertension - lisinopril (PRINIVIL,ZESTRIL) 20 MG tablet; Take 1 tablet (20 mg total) by mouth daily.  Dispense: 90 tablet; Refill: 0 - amLODipine (NORVASC) 5 MG tablet; Take 1 tablet (5 mg total) by mouth daily.  Dispense: 90 tablet; Refill: 0 - Follow up for establish Care visit and CPE - Monitor blood pressure at home - Return precautions given  Dorothyann Peng, NP

## 2016-10-21 ENCOUNTER — Encounter: Payer: Self-pay | Admitting: Adult Health

## 2016-10-21 NOTE — Telephone Encounter (Signed)
I contacted patient She states that she would be fine without the letter for the city, but her mother, Jordan Ramirez needs one - she states that she is 56 years old and cannot take the trash to the road.

## 2016-10-21 NOTE — Telephone Encounter (Signed)
I have her note for the personal trainer. I can send it or she can pick it up. I am unable to e-mail it.   The letter to the city, I am going to have her wait until evaluated further because that note will need to have a duration of time associated with it.

## 2016-10-21 NOTE — Telephone Encounter (Signed)
Jordan Ramirez pt returned your call °

## 2016-10-21 NOTE — Telephone Encounter (Signed)
Please advise 

## 2016-10-21 NOTE — Telephone Encounter (Signed)
I left a message for patient stating that letter for personal trainer was ready to be picked up, or I could send it in the mail if patient would like - will await return phone call.   I also stated that letter for the city would need to be further evaluated & if she had any questions, she could give [place] a call. Thanks!

## 2016-10-26 DIAGNOSIS — N859 Noninflammatory disorder of uterus, unspecified: Secondary | ICD-10-CM | POA: Diagnosis not present

## 2016-10-26 DIAGNOSIS — N921 Excessive and frequent menstruation with irregular cycle: Secondary | ICD-10-CM | POA: Diagnosis not present

## 2016-10-26 DIAGNOSIS — D251 Intramural leiomyoma of uterus: Secondary | ICD-10-CM | POA: Diagnosis not present

## 2016-10-26 DIAGNOSIS — Z3202 Encounter for pregnancy test, result negative: Secondary | ICD-10-CM | POA: Diagnosis not present

## 2016-10-26 DIAGNOSIS — N951 Menopausal and female climacteric states: Secondary | ICD-10-CM | POA: Diagnosis not present

## 2016-10-27 ENCOUNTER — Encounter: Payer: Self-pay | Admitting: Sports Medicine

## 2016-10-27 ENCOUNTER — Ambulatory Visit (INDEPENDENT_AMBULATORY_CARE_PROVIDER_SITE_OTHER): Payer: BLUE CROSS/BLUE SHIELD | Admitting: Sports Medicine

## 2016-10-27 VITALS — BP 138/98 | Ht 67.0 in | Wt 281.0 lb

## 2016-10-27 DIAGNOSIS — M170 Bilateral primary osteoarthritis of knee: Secondary | ICD-10-CM

## 2016-10-28 NOTE — Progress Notes (Signed)
   Subjective:    Patient ID: Jordan Ramirez, female    DOB: 1960/11/25, 56 y.o.   MRN: [id number]  HPI chief complaint: Bilateral knee pain  56 year old female comes in today complaining of bilateral knee pain. She has a known history of osteoarthritis in both knees. She recently suffered a fall which aggravated her condition. Over the years she has been treated with meloxicam, physical therapy, and aquatic therapy. Both the aquatic therapy and the physical therapy were helpful. The meloxicam is only partially beneficial. She has been working with a Physiological scientist who has her performing chair squats and a number of open chain quadriceps and hamstring strengthening exercises. She is avoiding most closed chain exercises. Her knee pain is diffuse in both knees. She gets intermittent swelling in the right knee. She denies any prior surgery. In addition to working with her personal trainer she is also  wearing a compression sleeve which she finds helpful. She understands that her weight is playing a big role in her knee pain. In fact, she has had success losing weight in the past and is currently once again trying to lose weight.  Past medical history reviewed Medications reviewed Allergies reviewed    Review of Systems As above     Objective:   Physical Exam  Obese. Acute distress. Awake alert and oriented 3. Vital signs reviewed  Examination of both knees shows range of motion from 0-120. No effusion. Mild tenderness to palpation along the medial joint lines but not markedly. No tenderness along the lateral joint lines. 3+ patellofemoral crepitus. Good joint stability. Neurovascularly intact distally.  X-rays of both knees including AP, lateral, and sunrise views shows advanced degenerative changes in both the medial and patellofemoral joints. Nothing acute.      Assessment & Plan:  Bilateral knee pain secondary to end-stage DJD Obesity  Patient understands that definitive treatment  is a total knee arthroplasty and she also understands the role that weight is playing in her knee pain. She is actively trying to lose weight and she understands that she will need to get her BMI below 40 before arthroplasty is even considered. Since she has had good success with physical therapy in the past, she would like to try some more PT. I will refer her to [facility] PT. We talked about changing her meloxicam but she is doesn't want to do this. I recommended that she add Tylenol arthritis as needed. I've encouraged her to continue working with her personal trainer and she will follow-up with me in 4 weeks.

## 2016-11-22 ENCOUNTER — Other Ambulatory Visit: Payer: Self-pay | Admitting: Adult Health

## 2016-11-22 DIAGNOSIS — G8929 Other chronic pain: Secondary | ICD-10-CM

## 2016-11-22 DIAGNOSIS — M25569 Pain in unspecified knee: Principal | ICD-10-CM

## 2016-11-23 NOTE — Telephone Encounter (Signed)
Ok to refill for 90 days + 1 

## 2016-11-23 NOTE — Telephone Encounter (Signed)
Okay to refill? 

## 2016-11-25 ENCOUNTER — Ambulatory Visit: Payer: BLUE CROSS/BLUE SHIELD | Admitting: Sports Medicine

## 2016-12-02 ENCOUNTER — Encounter: Payer: Self-pay | Admitting: Sports Medicine

## 2016-12-02 ENCOUNTER — Ambulatory Visit (INDEPENDENT_AMBULATORY_CARE_PROVIDER_SITE_OTHER): Payer: BLUE CROSS/BLUE SHIELD | Admitting: Sports Medicine

## 2016-12-02 VITALS — BP 160/95 | Ht 67.0 in | Wt 250.0 lb

## 2016-12-02 DIAGNOSIS — M1711 Unilateral primary osteoarthritis, right knee: Secondary | ICD-10-CM | POA: Diagnosis not present

## 2016-12-02 MED ORDER — METHYLPREDNISOLONE ACETATE 40 MG/ML IJ SUSP
40.0000 mg | Freq: Once | INTRAMUSCULAR | Status: AC
  Administered 2016-12-02: 40 mg via INTRA_ARTICULAR

## 2016-12-02 NOTE — Progress Notes (Signed)
   Subjective:    Patient ID: Jordan Ramirez, female    DOB: Jun 29, 1961, 56 y.o.   MRN: [id number]  HPI   Patient comes in today to discuss treatment of her right knee osteoarthritis. She has a documented history of end-stage DJD. She is trying desperately to lose weight. She understands that definitive treatment is a total knee arthroplasty. Her meloxicam does help but she is wanting to be more aggressive with treatment.    Review of Systems As above    Objective:   Physical Exam  Well-developed, well-nourished. No acute distress. Obese.  Right knee: Range of motion 0-120. No effusion. Mild tenderness to palpation along the medial and lateral joint lines. Negative McMurray's. Neurovascularly intact distally. Walking with a limp.      Assessment & Plan:   Right knee pain secondary to end-stage DJD  Definitive treatment is a total knee arthroplasty and even though her BMI is over 40 she appears to have a fairly normal appearing leg and knee. Nonetheless, she is not ready to pursue surgery. I recommended that we try a cortisone injection. This is done using an anterior medial approach. She tolerates this without difficulty. Hopefully this will allow her to continue her efforts to lose weight. If she finds benefit from today's injection that I can repeat it several months down the road. Follow-up as needed.  Consent obtained and verified. Time-out conducted. Noted no overlying erythema, induration, or other signs of local infection. Skin prepped in a sterile fashion. Topical analgesic spray: Ethyl chloride. Joint: right knee Needle: 25g 1.5 inch Completed without difficulty. Meds: 3cc 1% xylocaine, 1cc (40mg ) depomedrol  Advised to call if fevers/chills, erythema, induration, drainage, or persistent bleeding.

## 2016-12-06 DIAGNOSIS — M1711 Unilateral primary osteoarthritis, right knee: Secondary | ICD-10-CM | POA: Diagnosis not present

## 2016-12-06 DIAGNOSIS — M1712 Unilateral primary osteoarthritis, left knee: Secondary | ICD-10-CM | POA: Diagnosis not present

## 2016-12-08 DIAGNOSIS — M1712 Unilateral primary osteoarthritis, left knee: Secondary | ICD-10-CM | POA: Diagnosis not present

## 2016-12-08 DIAGNOSIS — M1711 Unilateral primary osteoarthritis, right knee: Secondary | ICD-10-CM | POA: Diagnosis not present

## 2016-12-15 DIAGNOSIS — M1712 Unilateral primary osteoarthritis, left knee: Secondary | ICD-10-CM | POA: Diagnosis not present

## 2016-12-15 DIAGNOSIS — M1711 Unilateral primary osteoarthritis, right knee: Secondary | ICD-10-CM | POA: Diagnosis not present

## 2016-12-17 DIAGNOSIS — M1711 Unilateral primary osteoarthritis, right knee: Secondary | ICD-10-CM | POA: Diagnosis not present

## 2016-12-17 DIAGNOSIS — M1712 Unilateral primary osteoarthritis, left knee: Secondary | ICD-10-CM | POA: Diagnosis not present

## 2016-12-22 DIAGNOSIS — M1711 Unilateral primary osteoarthritis, right knee: Secondary | ICD-10-CM | POA: Diagnosis not present

## 2016-12-22 DIAGNOSIS — M1712 Unilateral primary osteoarthritis, left knee: Secondary | ICD-10-CM | POA: Diagnosis not present

## 2016-12-29 DIAGNOSIS — M1711 Unilateral primary osteoarthritis, right knee: Secondary | ICD-10-CM | POA: Diagnosis not present

## 2016-12-29 DIAGNOSIS — M1712 Unilateral primary osteoarthritis, left knee: Secondary | ICD-10-CM | POA: Diagnosis not present

## 2017-01-01 DIAGNOSIS — M1711 Unilateral primary osteoarthritis, right knee: Secondary | ICD-10-CM | POA: Diagnosis not present

## 2017-01-01 DIAGNOSIS — M1712 Unilateral primary osteoarthritis, left knee: Secondary | ICD-10-CM | POA: Diagnosis not present

## 2017-01-03 DIAGNOSIS — M1711 Unilateral primary osteoarthritis, right knee: Secondary | ICD-10-CM | POA: Diagnosis not present

## 2017-01-03 DIAGNOSIS — M1712 Unilateral primary osteoarthritis, left knee: Secondary | ICD-10-CM | POA: Diagnosis not present

## 2017-01-15 DIAGNOSIS — M1711 Unilateral primary osteoarthritis, right knee: Secondary | ICD-10-CM | POA: Diagnosis not present

## 2017-01-15 DIAGNOSIS — M1712 Unilateral primary osteoarthritis, left knee: Secondary | ICD-10-CM | POA: Diagnosis not present

## 2017-01-18 DIAGNOSIS — M1712 Unilateral primary osteoarthritis, left knee: Secondary | ICD-10-CM | POA: Diagnosis not present

## 2017-01-18 DIAGNOSIS — M1711 Unilateral primary osteoarthritis, right knee: Secondary | ICD-10-CM | POA: Diagnosis not present

## 2017-01-20 DIAGNOSIS — M1711 Unilateral primary osteoarthritis, right knee: Secondary | ICD-10-CM | POA: Diagnosis not present

## 2017-01-20 DIAGNOSIS — M1712 Unilateral primary osteoarthritis, left knee: Secondary | ICD-10-CM | POA: Diagnosis not present

## 2017-01-21 ENCOUNTER — Other Ambulatory Visit: Payer: Self-pay | Admitting: Adult Health

## 2017-01-21 DIAGNOSIS — I10 Essential (primary) hypertension: Secondary | ICD-10-CM

## 2017-01-25 DIAGNOSIS — M1712 Unilateral primary osteoarthritis, left knee: Secondary | ICD-10-CM | POA: Diagnosis not present

## 2017-01-25 DIAGNOSIS — M1711 Unilateral primary osteoarthritis, right knee: Secondary | ICD-10-CM | POA: Diagnosis not present

## 2017-01-31 DIAGNOSIS — M1711 Unilateral primary osteoarthritis, right knee: Secondary | ICD-10-CM | POA: Diagnosis not present

## 2017-01-31 DIAGNOSIS — M1712 Unilateral primary osteoarthritis, left knee: Secondary | ICD-10-CM | POA: Diagnosis not present

## 2017-02-16 DIAGNOSIS — M1712 Unilateral primary osteoarthritis, left knee: Secondary | ICD-10-CM | POA: Diagnosis not present

## 2017-02-16 DIAGNOSIS — M1711 Unilateral primary osteoarthritis, right knee: Secondary | ICD-10-CM | POA: Diagnosis not present

## 2017-03-08 DIAGNOSIS — M1711 Unilateral primary osteoarthritis, right knee: Secondary | ICD-10-CM | POA: Diagnosis not present

## 2017-03-08 DIAGNOSIS — M1712 Unilateral primary osteoarthritis, left knee: Secondary | ICD-10-CM | POA: Diagnosis not present

## 2017-03-17 DIAGNOSIS — M1712 Unilateral primary osteoarthritis, left knee: Secondary | ICD-10-CM | POA: Diagnosis not present

## 2017-03-17 DIAGNOSIS — M1711 Unilateral primary osteoarthritis, right knee: Secondary | ICD-10-CM | POA: Diagnosis not present

## 2017-03-23 DIAGNOSIS — M1712 Unilateral primary osteoarthritis, left knee: Secondary | ICD-10-CM | POA: Diagnosis not present

## 2017-03-23 DIAGNOSIS — M1711 Unilateral primary osteoarthritis, right knee: Secondary | ICD-10-CM | POA: Diagnosis not present

## 2017-03-30 DIAGNOSIS — M1711 Unilateral primary osteoarthritis, right knee: Secondary | ICD-10-CM | POA: Diagnosis not present

## 2017-03-30 DIAGNOSIS — M1712 Unilateral primary osteoarthritis, left knee: Secondary | ICD-10-CM | POA: Diagnosis not present

## 2017-04-18 ENCOUNTER — Encounter: Payer: Self-pay | Admitting: Family Medicine

## 2017-04-18 ENCOUNTER — Ambulatory Visit (INDEPENDENT_AMBULATORY_CARE_PROVIDER_SITE_OTHER): Payer: BLUE CROSS/BLUE SHIELD | Admitting: Family Medicine

## 2017-04-18 VITALS — BP 130/90 | HR 80 | Resp 12 | Ht 67.0 in | Wt 272.4 lb

## 2017-04-18 DIAGNOSIS — R600 Localized edema: Secondary | ICD-10-CM

## 2017-04-18 DIAGNOSIS — M25572 Pain in left ankle and joints of left foot: Secondary | ICD-10-CM

## 2017-04-18 MED ORDER — MELOXICAM 15 MG PO TABS: 15.0000 mg | ORAL_TABLET | Freq: Every day | ORAL | 0 refills | Status: DC

## 2017-04-18 NOTE — Progress Notes (Signed)
HPI:   ACUTE VISIT:  Chief Complaint  Patient presents with  . Ankle Pain    [name] is a 56 y.o. female, who is here today complaining of 2 months of intermittent left ankle shooting pain.  She completed knee PT and she thinks exercises she was doing for her knee may have caused or aggravate ankle pain. Pain is on posterior and medial aspect of ankle, she is concerned about a "bump" on posterior aspect.  Ankle Pain   The incident occurred more than 1 week ago. Incident location: no injury. There was no injury mechanism. The pain is present in the left ankle. The pain is at a severity of 4/10. The pain is moderate. The pain has been intermittent since onset. Pertinent negatives include no inability to bear weight, loss of motion, loss of sensation, muscle weakness, numbness or tingling. She reports no foreign bodies present. Exacerbated by: walking. She has tried rest for the symptoms. The treatment provided moderate relief.   She has not tried OTC analgesics.  Also c/o bilateral ankle edema, no erythema. She has not noted decreased in urinary output or gross hematuria. HTN currently on Amlodipine 5 mg and Lisinopril 20 mg daily.   Review of Systems  Constitutional: Negative for appetite change, chills and fever.  HENT: Negative for facial swelling and mouth sores.   Respiratory: Negative for chest tightness and shortness of breath.   Cardiovascular: Positive for leg swelling. Negative for palpitations.  Genitourinary: Negative for decreased urine volume and hematuria.  Musculoskeletal: Positive for arthralgias and joint swelling. Negative for back pain and gait problem.  Skin: Negative for pallor and rash.  Neurological: Negative for tingling, weakness and numbness.      Current Outpatient Prescriptions on File Prior to Visit  Medication Sig Dispense Refill  . amLODipine (NORVASC) 5 MG tablet TAKE 1 TABLET (5 MG TOTAL) BY MOUTH DAILY. 90 tablet 0  .  lisinopril (PRINIVIL,ZESTRIL) 20 MG tablet Take 1 tablet (20 mg total) by mouth daily. 90 tablet 0  . metFORMIN (GLUCOPHAGE-XR) 500 MG 24 hr tablet Take 1 tablet (500 mg total) by mouth 2 (two) times daily with a meal. **MUST ESTABLISH WITH NEW PCP FOR FURTHER REFILLS** 60 tablet 0   No current facility-administered medications on file prior to visit.      Past Medical History:  Diagnosis Date  . Arthritis   . Chronic low back pain   . Diabetes mellitus without complication ([place])   . Hypertension   . Obesity    No Known Allergies  Social History   Social History  . Marital status: Single    Spouse name: N/A  . Number of children: N/A  . Years of education: N/A   Occupational History  . 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 Smoking status: Never Smoker  . Smokeless tobacco: Never Used  . Alcohol use No  . Drug use: No  . Sexual activity: Not Asked   Other Topics Concern  . None   Social History Narrative  . None    Vitals:   04/18/17 0948  BP: 130/90  Pulse: 80  Resp: 12   Body mass index is 42.66 kg/m.   Physical Exam  Nursing note and vitals reviewed. Constitutional: She is oriented to person, place, and time. She appears well-developed. She does not appear ill. No distress.  HENT:  Head: Atraumatic.  Mouth/Throat:  Oropharynx is clear and moist. Mucous membranes are dry.  Eyes: Conjunctivae are normal.  Cardiovascular: Normal rate and regular rhythm.   Pulses:      Dorsalis pedis pulses are 2+ on the right side, and 2+ on the left side.  Varicose veins mild, bilateral.  Respiratory: Effort normal and breath sounds normal. No respiratory distress.  GI: Soft. She exhibits no mass. There is no tenderness.  Musculoskeletal: She exhibits edema (Trace pitting edema ,LE bilateral.). She exhibits no tenderness.       Left ankle: She exhibits swelling. She exhibits normal range of  motion, no ecchymosis and no deformity.  No pain with palpation of ankle ligaments and no pain with ROM. Thompson test negative. No limitation ROM. Achilles tendon: Right more prominent that left. Normal gait.   Neurological: She is alert and oriented to person, place, and time. She has normal strength. Coordination normal.  Skin: Skin is warm. No rash noted. No erythema.  Psychiatric: Her speech is normal. Her mood appears anxious.  Well groomed, good eye contact.     ASSESSMENT AND PLAN:  [name] was seen today for ankle pain.  Diagnoses and all orders for this visit:  Acute left ankle pain  Possible causes discussed. Because no Hx of direct injury and otherwise negative examination today I do not think imaging is necessary today. She has taken Mobic in the past, some side effects discussed. ROM exercises may help. Wt loss recommended. She can follow with podiatrists or with Dr Micheline Chapman as needed.   -     meloxicam (MOBIC) 15 MG tablet; Take 1 tablet (15 mg total) by mouth daily.  Bilateral lower extremity edema  ? Vein disease. Other possible causes: Obesity and med side effects.  LE elevation or compression stocking may help. Low salt diet. Skin care. F/U as needed.   -[name] was advised to seek immediate medical attention if symptoms sudden worsening or to follow if symptoms persist.       Kysa Calais G. [place], MD  [facility]. [facility] office.

## 2017-04-18 NOTE — Patient Instructions (Signed)
A few things to remember from today's visit:   Acute left ankle pain - Plan: meloxicam (MOBIC) 15 MG tablet  Bilateral lower extremity edema  Monitor blood pressure at home. Follow with Dr Micheline Chapman if needed or with podiatrists. Range of motion exercises may help as well as wt loss. Compression stocking for legs.   Please be sure medication list is accurate. If a new problem present, please set up appointment sooner than planned today.

## 2017-05-02 NOTE — Progress Notes (Deleted)
HPI:   [name] is a 56 y.o. female, who is here today to establish care, I saw her for acute visit on 04/18/17. .  Former PCP: Dr Jordan Ramirez Last preventive routine visit: 2-3 years ago. She follows with gyn regularly.   Chronic medical problems: Chronic pain (generalized OA), HTN, DM II  Diabetes Mellitus II:    Currently on Metformin XL 500 mg 2 tabs daily. . Last eye exam: *** Checking BS's : *** Hypoglycemia:  *** is tolerating medications well. *** denies abdominal pain, nausea, vomiting, polydipsia, polyuria, or polyphagia. ***numbness, tingling, or burning.     Lab Results  Component Value Date   CREATININE 0.8 09/30/2014   BUN 16 09/30/2014   NA 138 09/30/2014   K 3.7 09/30/2014   CL 107 09/30/2014   CO2 25 09/30/2014    Lab Results  Component Value Date   HGBA1C 6.7 05/14/2016    HTN:  Currently on Lisinopril 20 mg and Amlodipine 5 mg daily. Home BP readings: *** Denies severe/frequent headache, visual changes, chest pain, dyspnea, palpitation, claudication, focal weakness, or worsening edema.    Concerns today: ***     Review of Systems    Current Outpatient Prescriptions on File Prior to Visit  Medication Sig Dispense Refill  . amLODipine (NORVASC) 5 MG tablet TAKE 1 TABLET (5 MG TOTAL) BY MOUTH DAILY. 90 tablet 0  . lisinopril (PRINIVIL,ZESTRIL) 20 MG tablet Take 1 tablet (20 mg total) by mouth daily. 90 tablet 0  . meloxicam (MOBIC) 15 MG tablet Take 1 tablet (15 mg total) by mouth daily. 30 tablet 0  . metFORMIN (GLUCOPHAGE-XR) 500 MG 24 hr tablet Take 1 tablet (500 mg total) by mouth 2 (two) times daily with a meal. **MUST ESTABLISH WITH NEW PCP FOR FURTHER REFILLS** 60 tablet 0   No current facility-administered medications on file prior to visit.      Past Medical History:  Diagnosis Date  . Arthritis   . Chronic low back pain   . Diabetes mellitus without complication ([place])   . Hypertension   . Obesity    No  Known Allergies  Family History  Problem Relation Age of Onset  . Coronary artery disease Mother   . Diabetes type II Mother   . Depression Mother   . Hypertension Mother   . Kidney failure Father     Social History   Social History  . Marital status: Single    Spouse name: N/A  . Number of children: N/A  . Years of education: N/A   Occupational History  . 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 Smoking status: Never Smoker  . Smokeless tobacco: Never Used  . Alcohol use No  . Drug use: No  . Sexual activity: Not on file   Other Topics Concern  . Not on file   Social History Narrative  . No narrative on file    There were no vitals filed for this visit.  There is no height or weight on file to calculate BMI.      Physical Exam    ASSESSMENT AND PLAN:     There are no diagnoses linked to this encounter.              Jordan Ramirez G. Martinique, MD  [facility]. [place] office.

## 2017-05-03 ENCOUNTER — Ambulatory Visit: Payer: BLUE CROSS/BLUE SHIELD | Admitting: Family Medicine

## 2017-05-03 DIAGNOSIS — Z0289 Encounter for other administrative examinations: Secondary | ICD-10-CM

## 2017-06-12 NOTE — Progress Notes (Signed)
HPI:   [name] is a 56 y.o. female, who is here today to follow on some chronic medical problems. She is former Dr Jordan Ramirez pt. I saw her recently for acute visit, 04/18/17.  She has Hx of DM II,HTN,HLD among some and has not followed in over a year.   Diabetes Mellitus II:   Dx 04/2012 Currently on Metformin XR 500 mg 2 tabs daily. Last eye exam: "Many years", 2007. Checking BS's : Not checking. Hypoglycemia:Denies.  She is tolerating medications well. She denies abdominal pain, nausea, vomiting, polydipsia, polyuria, or polyphagia. "Some" numbness left foot.   Lab Results  Component Value Date   HGBA1C 6.7 05/14/2016    Hypertension:   Dx 1997. Currently on Lisinopril 20 mg daily and Amlodipine 5 mg daily..   She doesn't check her BP periodically.  She is taking medications as instructed, no side effects reported.  She has not noted unusual headache, visual changes, exertional chest pain, dyspnea,  focal weakness, or edema.   Lab Results  Component Value Date   CREATININE 0.8 09/30/2014   BUN 16 09/30/2014   NA 138 09/30/2014   K 3.7 09/30/2014   CL 107 09/30/2014   CO2 25 09/30/2014     Hyperlipidemia:  Currently on non pharmacologic treatment. Following a low fat diet: Not consistently.   Lab Results  Component Value Date   CHOL 160 09/30/2014   HDL 55.50 09/30/2014   LDLCALC 85 09/30/2014   LDLDIRECT 118.5 04/17/2012   TRIG 99.0 09/30/2014   CHOLHDL 3 09/30/2014   "Emotional eater", she has not been consistent with a healthy diet and does not exercise regularly. She is snacking on Oreo cookies, one package in 2-3 days.  She completed PT a month ago for knee OA. She has not been consistent with regular exercise.  Caregiver of her mother, who is 51 years old.  Hx of iron deficiency anemia: She is still having menses, irregular for the past 6 months. Moderately heavy vaginal bleeding in 04/2017, also in 02/2017 In 12/2016 she  followed with gyn. Reporting lab work-up done and negative.    Review of Systems  Constitutional: Positive for fatigue. Negative for activity change, appetite change, fever and unexpected weight change.  HENT: Negative for mouth sores, nosebleeds, sore throat and trouble swallowing.   Eyes: Negative for redness and visual disturbance.  Respiratory: Negative for cough, shortness of breath and wheezing.   Cardiovascular: Negative for chest pain, palpitations and leg swelling.  Gastrointestinal: Negative for abdominal pain, nausea and vomiting.       Negative for changes in bowel habits.  Endocrine: Negative for cold intolerance, heat intolerance, polydipsia, polyphagia and polyuria.  Genitourinary: Positive for menstrual problem. Negative for decreased urine volume, dysuria and hematuria.  Musculoskeletal: Positive for arthralgias. Negative for gait problem and myalgias.  Skin: Negative for rash and wound.  Neurological: Positive for numbness. Negative for syncope, weakness and headaches.  Psychiatric/Behavioral: Negative for confusion. The patient is nervous/anxious.       Current Outpatient Prescriptions on File Prior to Visit  Medication Sig Dispense Refill  . meloxicam (MOBIC) 15 MG tablet Take 1 tablet (15 mg total) by mouth daily. 30 tablet 0   No current facility-administered medications on file prior to visit.      Past Medical History:  Diagnosis Date  . Arthritis   . Chronic low back pain   . Diabetes mellitus without complication ([place])   . Hypertension   . Obesity  No Known Allergies  Social History   Social History  . Marital status: Single    Spouse name: N/A  . Number of children: N/A  . Years of education: N/A   Occupational History  . 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 Smoking status: Never Smoker  . Smokeless tobacco: Never Used  . Alcohol use No  . Drug use: No    . Sexual activity: Not Asked   Other Topics Concern  . None   Social History Narrative  . None    Vitals:   06/13/17 1115 06/13/17 1153  BP: 132/80   Pulse: (!) 107 96  Resp: 12   SpO2: 98%    Body mass index is 43.54 kg/m.   Physical Exam  Nursing note and vitals reviewed. Constitutional: She is oriented to person, place, and time. She appears well-developed. No distress.  HENT:  Head: Normocephalic and atraumatic.  Mouth/Throat: Oropharynx is clear and moist and mucous membranes are normal.  Eyes: Pupils are equal, round, and reactive to light. Conjunctivae and EOM are normal.  Neck: No tracheal deviation present. No thyroid mass and no thyromegaly present.  Cardiovascular: Normal rate and regular rhythm.   No murmur heard. Pulses:      Dorsalis pedis pulses are 2+ on the right side, and 2+ on the left side.  Respiratory: Effort normal and breath sounds normal. No respiratory distress.  GI: Soft. She exhibits no mass. There is no hepatomegaly. There is no tenderness.  Musculoskeletal: She exhibits no edema.  Lymphadenopathy:    She has no cervical adenopathy.  Neurological: She is alert and oriented to person, place, and time. She has normal strength. Coordination and gait normal.  Skin: Skin is warm. No erythema.  Psychiatric: Her mood appears anxious.  Well groomed, good eye contact.   Diabetic Foot Exam - Simple   Simple Foot Form Diabetic Foot exam was performed with the following findings:  Yes 06/13/2017 11:57 AM  Visual Inspection No deformities, no ulcerations, no other skin breakdown bilaterally:  Yes Sensation Testing Intact to touch and monofilament testing bilaterally:  Yes Pulse Check Posterior Tibialis and Dorsalis pulse intact bilaterally:  Yes Comments     ASSESSMENT AND PLAN:   [name] was seen today for establish care.  Diagnoses and all orders for this visit:  Lab Results  Component Value Date   HGBA1C 6.8 (H) 06/13/2017      Chemistry      Component Value Date/Time   NA 142 06/13/2017 1214   K 4.5 06/13/2017 1214   CL 108 06/13/2017 1214   CO2 30 06/13/2017 1214   BUN 14 06/13/2017 1214   CREATININE 0.88 06/13/2017 1214      Component Value Date/Time   CALCIUM 9.4 06/13/2017 1214   ALKPHOS 88 06/13/2017 1214   AST 15 06/13/2017 1214   ALT 15 06/13/2017 1214   BILITOT 0.3 06/13/2017 1214     Lab Results  Component Value Date   MICROALBUR 5.7 (H) 06/13/2017   Lab Results  Component Value Date   WBC 4.3 06/13/2017   HGB 13.0 06/13/2017   HCT 40.2 06/13/2017   MCV 82.6 06/13/2017   PLT 196.0 06/13/2017   Lab Results  Component Value Date   CHOL 203 (H) 06/13/2017   HDL 53.90 06/13/2017   LDLCALC 127 (H) 06/13/2017   LDLDIRECT 118.5 04/17/2012   TRIG 110.0 06/13/2017  CHOLHDL 4 06/13/2017    Diabetes mellitus type 2 with neurological manifestations ([place])  HgA1C pending. No changes in current management, will adjust Metformin accordingly. We discussed other medications in case needed: Victoza or Trulicity,which may also help with wt loss. Regular exercise and healthy diet with avoidance of added sugar food intake is an important part of treatment and recommended. Annual eye exam, periodic dental and foot care recommended. F/U in 5-6 months   -     Comprehensive metabolic panel -     Hemoglobin A1c -     Fructosamine -     Microalbumin / creatinine urine ratio -     metFORMIN (GLUCOPHAGE-XR) 500 MG 24 hr tablet; Take 1 tablet (500 mg total) by mouth 2 (two) times daily with a meal. -     Blood Glucose Monitoring Suppl (ONETOUCH VERIO) w/Device KIT; 1 kit by Does not apply route once. Use as directed. -     glucose blood (ONETOUCH VERIO) test strip; Use to test blood sugars once daily. -     ONE TOUCH LANCETS MISC; Use to test blood sugars once daily.  Essential hypertension  Adequately controlled. No changes in current management. DASH diet recommended. Eye exam recommended  annually. F/U in 4-5 months, before if needed.  -     Comprehensive metabolic panel -     lisinopril (PRINIVIL,ZESTRIL) 20 MG tablet; Take 1 tablet (20 mg total) by mouth daily. -     amLODipine (NORVASC) 5 MG tablet; Take 1 tablet (5 mg total) by mouth daily.  Other iron deficiency anemia  Continue following with her gyn for DUB. Further recommendations will be given according to lab results.  -     CBC  Hyperlipidemia, unspecified hyperlipidemia type  She is not on statin. Low fat diet recommended for now. Further recommendations will be given according to lab results.  The 10-year ASCVD risk score Mikey Bussing DC Brooke Bonito., et al., 2013) is: 14.7%   Values used to calculate the score:     Age: 56 years     Sex: Female     Is Non-Hispanic African American: Yes     Diabetic: Yes     Tobacco smoker: No     Systolic Blood Pressure: 008 mmHg     Is BP treated: Yes     HDL Cholesterol: 53.9 mg/dL     Total Cholesterol: 203 mg/dL   -     Comprehensive metabolic panel -     Lipid panel  Class 3 severe obesity due to excess calories with serious comorbidity and body mass index (BMI) of 45.0 to 49.9 in adult [facility])  We discussed benefits of wt loss as well as adverse effects of obesity. Consistency with healthy diet and physical activity recommended. Daily brisk walking for 15-30 min as tolerated.      -[name] was advised to return sooner than planned today if new concerns arise.       Byrd Rushlow G. Martinique, MD  [facility]. [place] office.

## 2017-06-13 ENCOUNTER — Ambulatory Visit (INDEPENDENT_AMBULATORY_CARE_PROVIDER_SITE_OTHER): Payer: BLUE CROSS/BLUE SHIELD | Admitting: Family Medicine

## 2017-06-13 ENCOUNTER — Encounter: Payer: Self-pay | Admitting: Family Medicine

## 2017-06-13 VITALS — BP 132/80 | HR 96 | Resp 12 | Ht 67.0 in | Wt 278.0 lb

## 2017-06-13 DIAGNOSIS — E1149 Type 2 diabetes mellitus with other diabetic neurological complication: Secondary | ICD-10-CM | POA: Diagnosis not present

## 2017-06-13 DIAGNOSIS — D508 Other iron deficiency anemias: Secondary | ICD-10-CM

## 2017-06-13 DIAGNOSIS — I10 Essential (primary) hypertension: Secondary | ICD-10-CM | POA: Diagnosis not present

## 2017-06-13 DIAGNOSIS — E785 Hyperlipidemia, unspecified: Secondary | ICD-10-CM

## 2017-06-13 DIAGNOSIS — Z6841 Body Mass Index (BMI) 40.0 and over, adult: Secondary | ICD-10-CM | POA: Diagnosis not present

## 2017-06-13 LAB — COMPREHENSIVE METABOLIC PANEL
ALBUMIN: 3.8 g/dL (ref 3.5–5.2)
ALT: 15 U/L (ref 0–35)
AST: 15 U/L (ref 0–37)
Alkaline Phosphatase: 88 U/L (ref 39–117)
BILIRUBIN TOTAL: 0.3 mg/dL (ref 0.2–1.2)
BUN: 14 mg/dL (ref 6–23)
CALCIUM: 9.4 mg/dL (ref 8.4–10.5)
CO2: 30 mEq/L (ref 19–32)
CREATININE: 0.88 mg/dL (ref 0.40–1.20)
Chloride: 108 mEq/L (ref 96–112)
GFR: 85.06 mL/min (ref >60.00)
Glucose, Bld: 111 mg/dL — ABNORMAL HIGH (ref 70–99)
Potassium: 4.5 mEq/L (ref 3.5–5.1)
SODIUM: 142 meq/L (ref 135–145)
TOTAL PROTEIN: 7.1 g/dL (ref 6.0–8.3)

## 2017-06-13 LAB — LIPID PANEL
CHOLESTEROL: 203 mg/dL — AB (ref 0–200)
HDL: 53.9 mg/dL (ref >39.00)
LDL Cholesterol: 127 mg/dL — ABNORMAL HIGH (ref 0–99)
NonHDL: 148.89
TRIGLYCERIDES: 110 mg/dL (ref 0.0–149.0)
Total CHOL/HDL Ratio: 4
VLDL: 22 mg/dL (ref 0.0–40.0)

## 2017-06-13 LAB — CBC
HEMATOCRIT: 40.2 % (ref 36.0–46.0)
HEMOGLOBIN: 13 g/dL (ref 12.0–15.0)
MCHC: 32.4 g/dL (ref 30.0–36.0)
MCV: 82.6 fl (ref 78.0–100.0)
Platelets: 196 10*3/uL (ref 150.0–400.0)
RBC: 4.87 Mil/uL (ref 3.87–5.11)
RDW: 17 % — ABNORMAL HIGH (ref 11.5–15.5)
WBC: 4.3 10*3/uL (ref 4.0–10.5)

## 2017-06-13 LAB — HEMOGLOBIN A1C: Hgb A1c MFr Bld: 6.8 % — ABNORMAL HIGH (ref 4.6–6.5)

## 2017-06-13 LAB — MICROALBUMIN / CREATININE URINE RATIO
Creatinine,U: 342.7 mg/dL
MICROALB UR: 5.7 mg/dL — AB (ref 0.0–1.9)
Microalb Creat Ratio: 1.7 mg/g (ref 0.0–30.0)

## 2017-06-13 MED ORDER — ONETOUCH LANCETS MISC: 5 refills | Status: DC

## 2017-06-13 MED ORDER — AMLODIPINE BESYLATE 5 MG PO TABS: 5.0000 mg | ORAL_TABLET | Freq: Every day | ORAL | 2 refills | Status: DC

## 2017-06-13 MED ORDER — ONETOUCH VERIO W/DEVICE KIT: 1.0000 | PACK | Freq: Once | 0 refills | Status: AC

## 2017-06-13 MED ORDER — GLUCOSE BLOOD VI STRP: ORAL_STRIP | 5 refills | Status: DC

## 2017-06-13 MED ORDER — LISINOPRIL 20 MG PO TABS: 20.0000 mg | ORAL_TABLET | Freq: Every day | ORAL | 2 refills | Status: DC

## 2017-06-13 MED ORDER — METFORMIN HCL ER 500 MG PO TB24: 500.0000 mg | ORAL_TABLET | Freq: Two times a day (BID) | ORAL | 2 refills | Status: DC

## 2017-06-13 NOTE — Patient Instructions (Signed)
A few things to remember from today's visit:   Diabetes mellitus type 2 with neurological manifestations ([place]) - Plan: Comprehensive metabolic panel, Hemoglobin A1c, Fructosamine, Microalbumin / creatinine urine ratio  Essential hypertension - Plan: Comprehensive metabolic panel  Other iron deficiency anemia  Hyperlipidemia, unspecified hyperlipidemia type - Plan: Comprehensive metabolic panel, Lipid panel  HgA1C goal < 7.0. Avoid sugar added food:regular soft drinks, energy drinks, and sports drinks. candy. cakes. cookies. pies and cobblers. sweet rolls, pastries, and donuts. fruit drinks, such as fruitades and fruit punch. dairy desserts, such as ice cream  Mediterranean diet has showed benefits for sugar control.  How much and what type of carbohydrate foods are important for managing diabetes. The balance between how much insulin is in your body and the carbohydrate you eat makes a difference in your blood glucose levels.  Fasting blood sugar ideally 130 or less, 2 hours after meals less than 180.   Regular exercise also will help with controlling disease, daily brisk walking as tolerated for 15-30 min definitively will help.  Consider pneumonia shot, you can have it with the flu shot either September or October.  Avoid skipping meals, blood sugar might drop and cause serious problems. Remember checking feet periodically, good dental hygiene, and annual eye exam.     Please be sure medication list is accurate. If a new problem present, please set up appointment sooner than planned today.

## 2017-06-15 LAB — FRUCTOSAMINE: Fructosamine: 251 umol/L (ref 190–270)

## 2017-06-16 ENCOUNTER — Encounter: Payer: Self-pay | Admitting: Family Medicine

## 2017-07-07 ENCOUNTER — Encounter: Payer: Self-pay | Admitting: Family Medicine

## 2017-07-29 ENCOUNTER — Other Ambulatory Visit: Payer: Self-pay

## 2017-07-29 DIAGNOSIS — I10 Essential (primary) hypertension: Secondary | ICD-10-CM

## 2017-07-29 DIAGNOSIS — M25572 Pain in left ankle and joints of left foot: Secondary | ICD-10-CM

## 2017-07-29 DIAGNOSIS — E1149 Type 2 diabetes mellitus with other diabetic neurological complication: Secondary | ICD-10-CM

## 2017-07-29 MED ORDER — AMLODIPINE BESYLATE 5 MG PO TABS: 5.0000 mg | ORAL_TABLET | Freq: Every day | ORAL | 2 refills | Status: DC

## 2017-07-29 MED ORDER — METFORMIN HCL ER 500 MG PO TB24: 500.0000 mg | ORAL_TABLET | Freq: Two times a day (BID) | ORAL | 2 refills | Status: DC

## 2017-07-29 MED ORDER — LISINOPRIL 20 MG PO TABS: 20.0000 mg | ORAL_TABLET | Freq: Every day | ORAL | 2 refills | Status: DC

## 2017-08-03 ENCOUNTER — Other Ambulatory Visit: Payer: Self-pay

## 2017-08-03 DIAGNOSIS — M25572 Pain in left ankle and joints of left foot: Secondary | ICD-10-CM

## 2017-08-03 MED ORDER — MELOXICAM 15 MG PO TABS: 15.0000 mg | ORAL_TABLET | Freq: Every day | ORAL | 0 refills | Status: DC

## 2017-08-10 ENCOUNTER — Other Ambulatory Visit: Payer: Self-pay | Admitting: Family Medicine

## 2017-08-10 DIAGNOSIS — Z1231 Encounter for screening mammogram for malignant neoplasm of breast: Secondary | ICD-10-CM

## 2017-09-01 ENCOUNTER — Ambulatory Visit: Payer: BLUE CROSS/BLUE SHIELD

## 2017-09-29 ENCOUNTER — Ambulatory Visit
Admission: RE | Admit: 2017-09-29 | Discharge: 2017-09-29 | Disposition: A | Discharge status: Home / Self Care | Payer: BLUE CROSS/BLUE SHIELD | Source: Ambulatory Visit | Attending: Family Medicine | Admitting: Family Medicine

## 2017-09-29 DIAGNOSIS — Z1231 Encounter for screening mammogram for malignant neoplasm of breast: Secondary | ICD-10-CM

## 2017-10-03 ENCOUNTER — Ambulatory Visit: Payer: BLUE CROSS/BLUE SHIELD | Admitting: Family Medicine

## 2017-11-29 ENCOUNTER — Ambulatory Visit: Payer: BLUE CROSS/BLUE SHIELD | Admitting: Family Medicine

## 2017-12-15 NOTE — Progress Notes (Deleted)
HPI:   [name] is a 57 y.o. female, who is here today for *** months follow up.   *** was last seen on ***  Since *** last OV she has ***  Diabetes Mellitus II:    Currently on Metformin XR 500 mg bid. Problem has been stable. Checking BS's : *** Hypoglycemia:  *** is tolerating medications well. *** denies abdominal pain, nausea, vomiting, polydipsia, polyuria, or polyphagia. ***numbness, tingling, or burning.     Lab Results  Component Value Date   HGBA1C 6.8 (H) 06/13/2017   Lab Results  Component Value Date   MICROALBUR 5.7 (H) 06/13/2017   Hypertension:    Currently on Amlodipine 5 mg daily and Lisinopril 20 mg.    ***taking medications as instructed, no side effects reported.  ***has not noted unusual headache, visual changes, exertional chest pain, dyspnea,  focal weakness, or edema.   Lab Results  Component Value Date   CREATININE 0.88 06/13/2017   BUN 14 06/13/2017   NA 142 06/13/2017   K 4.5 06/13/2017   CL 108 06/13/2017   CO2 30 06/13/2017     {4+ HPI elements (or status of 3 or more chronic diseases)} ***   Hyperlipidemia:  Currently on non pharmacologic treatment. Statin med was recommended *** Following a low fat diet: ***.  *** has not noted side effects with medication.  Lab Results  Component Value Date   CHOL 203 (H) 06/13/2017   HDL 53.90 06/13/2017   LDLCALC 127 (H) 06/13/2017   LDLDIRECT 118.5 04/17/2012   TRIG 110.0 06/13/2017   CHOLHDL 4 06/13/2017        Review of Systems  [review of 2 to 9 systems] ***  Current Outpatient Medications on File Prior to Visit  Medication Sig Dispense Refill  . amLODipine (NORVASC) 5 MG tablet Take 1 tablet (5 mg total) by mouth daily. 90 tablet 2  . glucose blood (ONETOUCH VERIO) test strip Use to test blood sugars once daily. 100 each 5  . lisinopril (PRINIVIL,ZESTRIL) 20 MG tablet Take 1 tablet (20 mg total) by mouth daily. 90 tablet 2  . meloxicam  (MOBIC) 15 MG tablet Take 1 tablet (15 mg total) by mouth daily. 90 tablet 0  . metFORMIN (GLUCOPHAGE-XR) 500 MG 24 hr tablet Take 1 tablet (500 mg total) by mouth 2 (two) times daily with a meal. 180 tablet 2  . ONE TOUCH LANCETS MISC Use to test blood sugars once daily. 100 each 5   No current facility-administered medications on file prior to visit.      Past Medical History:  Diagnosis Date  . Arthritis   . Chronic low back pain   . Diabetes mellitus without complication ([place])   . Hypertension   . Obesity    No Known Allergies  Social History   Socioeconomic History  . Marital status: Single    Spouse name: Not on file  . Number of children: Not on file  . Years of education: Not on file  . Highest education level: Not on file  Social Needs  . Financial resource strain: Not on file  . Food insecurity - worry: Not on file  . Food insecurity - inability: Not on file  . Transportation needs - medical: Not on file  . Transportation needs - non-medical: Not on file  Occupational History  . 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 Smoking  status: Never Smoker  . Smokeless tobacco: Never Used  Substance and Sexual Activity  . Alcohol use: No  . Drug use: No  . Sexual activity: Not on file  Other Topics Concern  . Not on file  Social History Narrative  . Not on file    There were no vitals filed for this visit. There is no height or weight on file to calculate BMI.      Physical Exam  {[12+ exam elements]} ***  ASSESSMENT AND PLAN:   [name] was seen today for *** months follow-up.  No orders of the defined types were placed in this encounter.   No problem-specific Assessment & Plan notes found for this encounter.           -[name] was advised to return sooner than planned today if new concerns arise.       Betty G. Martinique, MD  [facility]. [place] office.

## 2017-12-16 ENCOUNTER — Ambulatory Visit: Payer: BLUE CROSS/BLUE SHIELD | Admitting: Family Medicine

## 2017-12-16 DIAGNOSIS — Z0289 Encounter for other administrative examinations: Secondary | ICD-10-CM

## 2018-01-09 NOTE — Progress Notes (Deleted)
HPI:   Ms.Ravin Whistler is a 57 y.o. female, who is here today for *** months follow up.   She was last seen on 06/13/17.    Diabetes Mellitus II:   Dx in 04/2012. Currently on Metformin XR 500 mg bid.  Checking BS's : *** Hypoglycemia: ***  *** is tolerating medications well. *** denies abdominal pain, nausea, vomiting, polydipsia, polyuria, or polyphagia. ***numbness, tingling, or burning.     Lab Results  Component Value Date   HGBA1C 6.8 (H) 06/13/2017   Lab Results  Component Value Date   MICROALBUR 5.7 (H) 06/13/2017   Hypertension:   Dx in 1997. Currently on Amlodipine 5 mg daily and Lisinopril 20 mg.    ***taking medications as instructed, no side effects reported.  ***has not noted unusual headache, visual changes, exertional chest pain, dyspnea,  focal weakness, or edema.   Lab Results  Component Value Date   CREATININE 0.88 06/13/2017   BUN 14 06/13/2017   NA 142 06/13/2017   K 4.5 06/13/2017   CL 108 06/13/2017   CO2 30 06/13/2017     {4+ HPI elements (or status of 3 or more chronic diseases)} ***   Hyperlipidemia:  Currently on non pharmacologic treatment. Statin med was recommended *** Following a low fat diet: ***.  *** has not noted side effects with medication.  Lab Results  Component Value Date   CHOL 203 (H) 06/13/2017   HDL 53.90 06/13/2017   LDLCALC 127 (H) 06/13/2017   LDLDIRECT 118.5 04/17/2012   TRIG 110.0 06/13/2017   CHOLHDL 4 06/13/2017        Review of Systems  [review of 2 to 9 systems] ***  Current Outpatient Medications on File Prior to Visit  Medication Sig Dispense Refill  . amLODipine (NORVASC) 5 MG tablet Take 1 tablet (5 mg total) by mouth daily. 90 tablet 2  . glucose blood (ONETOUCH VERIO) test strip Use to test blood sugars once daily. 100 each 5  . lisinopril (PRINIVIL,ZESTRIL) 20 MG tablet Take 1 tablet (20 mg total) by mouth daily. 90 tablet 2  . meloxicam (MOBIC) 15 MG tablet Take  1 tablet (15 mg total) by mouth daily. 90 tablet 0  . metFORMIN (GLUCOPHAGE-XR) 500 MG 24 hr tablet Take 1 tablet (500 mg total) by mouth 2 (two) times daily with a meal. 180 tablet 2  . ONE TOUCH LANCETS MISC Use to test blood sugars once daily. 100 each 5   No current facility-administered medications on file prior to visit.      Past Medical History:  Diagnosis Date  . Arthritis   . Chronic low back pain   . Diabetes mellitus without complication ([place])   . Hypertension   . Obesity    No Known Allergies  Social History   Socioeconomic History  . Marital status: Single    Spouse name: Not on file  . Number of children: Not on file  . Years of education: Not on file  . Highest education level: Not on file  Occupational History  . 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 Financial resource strain: Not on file  . Food insecurity:    Worry: Not on file    Inability: Not on file  . Transportation needs:    Medical: Not on file    Non-medical: Not on file  Tobacco Use  . Smoking status: Never Smoker  .  Smokeless tobacco: Never Used  Substance and Sexual Activity  . Alcohol use: No  . Drug use: No  . Sexual activity: Not on file  Lifestyle  . Physical activity:    Days per week: Not on file    Minutes per session: Not on file  . Stress: Not on file  Relationships  . Social connections:    Talks on phone: Not on file    Gets together: Not on file    Attends religious service: Not on file    Active member of club or organization: Not on file    Attends meetings of clubs or organizations: Not on file    Relationship status: Not on file  Other Topics Concern  . Not on file  Social History Narrative  . Not on file    There were no vitals filed for this visit. There is no height or weight on file to calculate BMI.      Physical Exam  {[12+ exam elements]} ***  ASSESSMENT AND  PLAN:   Ms. Shayann Garbutt was seen today for *** months follow-up.  No orders of the defined types were placed in this encounter.   No problem-specific Assessment & Plan notes found for this encounter.           -Ms. Elke Holtry was advised to return sooner than planned today if new concerns arise.       Mariyam Remington G. Martinique, MD  [facility]. [place] office.

## 2018-01-10 ENCOUNTER — Ambulatory Visit: Payer: BLUE CROSS/BLUE SHIELD | Admitting: Family Medicine

## 2018-01-15 NOTE — Progress Notes (Signed)
HPI:   [name] is a 57 y.o. female, who is here today for follow up.   She was last seen on 06/13/17.  Diabetes Mellitus II:   Dx in 04/2012. Currently on Metformin XR 500 mg bid.  Checking BS's : 100-120's (FG and at night). Hypoglycemia: No  She is tolerating medications well. No abdominal pain, nausea, vomiting, polydipsia, polyuria, or polyphagia.  Occasional numbness and tingling sensation left foot, stable for years.  2-3 weeks of occasional tingling sensation right hand.  She has no noted focal weakness or skin rash.     Lab Results  Component Value Date   HGBA1C 6.8 (H) 06/13/2017   Lab Results  Component Value Date   MICROALBUR 5.7 (H) 06/13/2017   Hypertension:   Dx in 1997. Currently on Amlodipine 5 mg daily and Lisinopril 20 mg.    She taking medications as instructed, no side effects reported.  She has not noted unusual headache, visual changes, exertional chest pain, dyspnea, or edema.   Lab Results  Component Value Date   CREATININE 0.88 06/13/2017   BUN 14 06/13/2017   NA 142 06/13/2017   K 4.5 06/13/2017   CL 108 06/13/2017   CO2 30 06/13/2017    Hyperlipidemia:  Currently on non pharmacologic treatment. Statin med was recommended but she was not interested in taking a new med. Following a low fat diet: Not consistently..  Lab Results  Component Value Date   CHOL 203 (H) 06/13/2017   HDL 53.90 06/13/2017   LDLCALC 127 (H) 06/13/2017   LDLDIRECT 118.5 04/17/2012   TRIG 110.0 06/13/2017   CHOLHDL 4 06/13/2017   Right heel pain for a month. No Hx of trauma. Soreness, worse when she first stands up, 3-4/10. She has not try OTC medication. Alleviated by rest.   Also concerned about a small nodular lesion in the right hand.  She is now reporting pain and no significant changes since she first noted, a while ago.   Review of Systems  Constitutional: Negative for activity change, appetite change, fatigue and  fever.  HENT: Negative for mouth sores, nosebleeds and sore throat.   Eyes: Negative for redness and visual disturbance.  Respiratory: Negative for cough, shortness of breath and wheezing.   Cardiovascular: Negative for chest pain, palpitations and leg swelling.  Gastrointestinal: Negative for abdominal pain, nausea and vomiting.       Negative for changes in bowel habits.  Endocrine: Negative for polydipsia, polyphagia and polyuria.  Genitourinary: Negative for decreased urine volume, dysuria and hematuria.  Musculoskeletal: Positive for myalgias. Negative for gait problem.  Skin: Negative for rash and wound.  Neurological: Negative for syncope, weakness and headaches.  Psychiatric/Behavioral: Negative for confusion. The patient is nervous/anxious.       Current Outpatient Medications on File Prior to Visit  Medication Sig Dispense Refill  . amLODipine (NORVASC) 5 MG tablet Take 1 tablet (5 mg total) by mouth daily. 90 tablet 2  . glucose blood (ONETOUCH VERIO) test strip Use to test blood sugars once daily. 100 each 5  . lisinopril (PRINIVIL,ZESTRIL) 20 MG tablet Take 1 tablet (20 mg total) by mouth daily. 90 tablet 2  . meloxicam (MOBIC) 15 MG tablet Take 1 tablet (15 mg total) by mouth daily. 90 tablet 0  . metFORMIN (GLUCOPHAGE-XR) 500 MG 24 hr tablet Take 1 tablet (500 mg total) by mouth 2 (two) times daily with a meal. 180 tablet 2  . ONE TOUCH LANCETS MISC Use  to test blood sugars once daily. 100 each 5   No current facility-administered medications on file prior to visit.      Past Medical History:  Diagnosis Date  . Arthritis   . Chronic low back pain   . Diabetes mellitus without complication ([place])   . Hypertension   . Obesity    No Known Allergies  Social History   Socioeconomic History  . Marital status: Single    Spouse name: Not on file  . Number of children: Not on file  . Years of education: Not on file  . Highest education level: Not on file    Occupational History  . 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 Financial resource strain: Not on file  . Food insecurity:    Worry: Not on file    Inability: Not on file  . Transportation needs:    Medical: Not on file    Non-medical: Not on file  Tobacco Use  . Smoking status: Never Smoker  . Smokeless tobacco: Never Used  Substance and Sexual Activity  . Alcohol use: No  . Drug use: No  . Sexual activity: Not on file  Lifestyle  . Physical activity:    Days per week: Not on file    Minutes per session: Not on file  . Stress: Not on file  Relationships  . Social connections:    Talks on phone: Not on file    Gets together: Not on file    Attends religious service: Not on file    Active member of club or organization: Not on file    Attends meetings of clubs or organizations: Not on file    Relationship status: Not on file  Other Topics Concern  . Not on file  Social History Narrative  . Not on file    Vitals:   01/16/18 1551  BP: 124/82  Pulse: 85  Temp: 98.3 F (36.8 C)   Body mass index is 44.42 kg/m.  Wt Readings from Last 3 Encounters:  01/16/18 283 lb 9.6 oz (128.6 kg)  06/13/17 278 lb (126.1 kg)  04/18/17 272 lb 6 oz (123.5 kg)     Physical Exam  Nursing note and vitals reviewed. Constitutional: She is oriented to person, place, and time. She appears well-developed. No distress.  HENT:  Head: Normocephalic and atraumatic.  Mouth/Throat: Oropharynx is clear and moist and mucous membranes are normal.  Eyes: Pupils are equal, round, and reactive to light. Conjunctivae are normal.  Cardiovascular: Normal rate and regular rhythm.  No murmur heard. Pulses:      Dorsalis pedis pulses are 2+ on the right side, and 2+ on the left side.  Respiratory: Effort normal and breath sounds normal. No respiratory distress.  GI: Soft. She exhibits no mass. There is no hepatomegaly.  There is no tenderness.  Musculoskeletal: She exhibits no edema.  2 cm nodular,mobile lesion dorsum of right wrist. No tender.   Right foot:Tenderness upon palpation of heel at the medial insertion of plantar fascia into calcaneous. No pain with palpation along planta fascia towards forefoot.  Dorsal flexion of first MTP does not elicits pain.  No edema or erythema appreciated on area.   Lymphadenopathy:    She has no cervical adenopathy.  Neurological: She is alert and oriented to person, place, and time. She has normal strength. Gait normal.  Skin: Skin is warm. No rash noted. No erythema.  Psychiatric: She has a normal mood and affect.  Well groomed, good eye contact.     ASSESSMENT AND PLAN:   Ms. Fatemah Pourciau was seen today for 6-7 months follow-up.  Orders Placed This Encounter  Procedures  . Basic metabolic panel  . Microalbumin / creatinine urine ratio  . Hemoglobin A1c  . HIV antibody (with reflex)  . Ambulatory referral to Gastroenterology    Lab Results  Component Value Date   MICROALBUR 3.5 (H) 01/16/2018   Lab Results  Component Value Date   HGBA1C 6.9 (H) 01/16/2018   Lab Results  Component Value Date   CREATININE 0.93 01/16/2018   BUN 15 01/16/2018   NA 141 01/16/2018   K 4.1 01/16/2018   CL 106 01/16/2018   CO2 28 01/16/2018    Diabetes mellitus type 2 with neurological manifestations ([place]) HgA1C is pending. No changes in current management, which will be adjusted depending of A1c. Regular exercise and healthy diet with avoidance of added sugar food intake is an important part of treatment and recommended. Annual eye exam, periodic dental and foot care are recommended. F/U in 5-6 months   Essential hypertension Adequately controlled. No changes in current management. DASH-Low-salt diet. Eye exam at least once per year. F/U in 6 months, before if needed.   Hyperlipemia She is now interested in taking startin/cholesterol medication.   So fr now she will try nonpharmacologic treatment for now. We will plan on checking cholesterol next visit.   Class 3 severe obesity due to excess calories with serious comorbidity and body mass index (BMI) of 45.0 to 49.9 in adult [facility]) About 5 pounds weight gain since 05/2017.   We discussed benefits of wt loss as well as adverse effects of obesity. Consistency with healthy diet and physical activity are recommended.     Colon cancer screening  - Ambulatory referral to Gastroenterology  Ganglion cyst of dorsum of right wrist  Small,asymptomatic. Reassured. She prefers to hold on ortho evaluation. She will continue monitoring.  Plantar fasciitis  Educated about Dx and treatment options. Stretching exercises,night splint, and comfortable shoes recommended. If problem is persistent podiatrist evaluation recommended.  Encounter for screening for HIV  - HIV antibody (with reflex)     -Ms. Rula Keniston was advised to return sooner than planned today if new concerns arise.       Betty G. Martinique, MD  [facility]. [place] office.

## 2018-01-16 ENCOUNTER — Ambulatory Visit: Payer: BLUE CROSS/BLUE SHIELD | Admitting: Family Medicine

## 2018-01-16 ENCOUNTER — Encounter: Payer: Self-pay | Admitting: Family Medicine

## 2018-01-16 VITALS — BP 124/82 | HR 85 | Temp 98.3°F | Resp 12 | Ht 67.0 in | Wt 283.6 lb

## 2018-01-16 DIAGNOSIS — Z6841 Body Mass Index (BMI) 40.0 and over, adult: Secondary | ICD-10-CM

## 2018-01-16 DIAGNOSIS — M722 Plantar fascial fibromatosis: Secondary | ICD-10-CM

## 2018-01-16 DIAGNOSIS — I10 Essential (primary) hypertension: Secondary | ICD-10-CM | POA: Diagnosis not present

## 2018-01-16 DIAGNOSIS — Z1211 Encounter for screening for malignant neoplasm of colon: Secondary | ICD-10-CM

## 2018-01-16 DIAGNOSIS — E785 Hyperlipidemia, unspecified: Secondary | ICD-10-CM

## 2018-01-16 DIAGNOSIS — E66813 Obesity, class 3: Secondary | ICD-10-CM

## 2018-01-16 DIAGNOSIS — Z114 Encounter for screening for human immunodeficiency virus [HIV]: Secondary | ICD-10-CM

## 2018-01-16 DIAGNOSIS — E1149 Type 2 diabetes mellitus with other diabetic neurological complication: Secondary | ICD-10-CM

## 2018-01-16 DIAGNOSIS — M67431 Ganglion, right wrist: Secondary | ICD-10-CM

## 2018-01-16 NOTE — Assessment & Plan Note (Signed)
HgA1C is pending. No changes in current management, which will be adjusted depending of A1c. Regular exercise and healthy diet with avoidance of added sugar food intake is an important part of treatment and recommended. Annual eye exam, periodic dental and foot care are recommended. F/U in 5-6 months

## 2018-01-16 NOTE — Assessment & Plan Note (Signed)
Adequately controlled. No changes in current management. DASH-Low-salt diet. Eye exam at least once per year. F/U in 6 months, before if needed.

## 2018-01-16 NOTE — Patient Instructions (Addendum)
A few things to remember from today's visit:   Diabetes mellitus type 2 with neurological manifestations ([place])  Essential hypertension  Hyperlipidemia, unspecified hyperlipidemia type   Plantar Fasciitis Plantar fasciitis is a painful foot condition that affects the heel. It occurs when the band of tissue that connects the toes to the heel bone (plantar fascia) becomes irritated. This can happen after exercising too much or doing other repetitive activities (overuse injury). The pain from plantar fasciitis can range from mild irritation to severe pain that makes it difficult for you to walk or move. The pain is usually worse in the morning or after you have been sitting or lying down for a while. What are the causes? This condition may be caused by:  Standing for long periods of time.  Wearing shoes that do not fit.  Doing high-impact activities, including running, aerobics, and ballet.  Being overweight.  Having an abnormal way of walking (gait).  Having tight calf muscles.  Having high arches in your feet.  Starting a new athletic activity.  What are the signs or symptoms? The main symptom of this condition is heel pain. Other symptoms include:  Pain that gets worse after activity or exercise.  Pain that is worse in the morning or after resting.  Pain that goes away after you walk for a few minutes.  How is this diagnosed? This condition may be diagnosed based on your signs and symptoms. Your health care provider will also do a physical exam to check for:  A tender area on the bottom of your foot.  A high arch in your foot.  Pain when you move your foot.  Difficulty moving your foot.  You may also need to have imaging studies to confirm the diagnosis. These can include:  X-rays.  Ultrasound.  MRI.  How is this treated? Treatment for plantar fasciitis depends on the severity of the condition. Your treatment may include:  Rest, ice, and over-the-counter  pain medicines to manage your pain.  Exercises to stretch your calves and your plantar fascia.  A splint that holds your foot in a stretched, upward position while you sleep (night splint).  Physical therapy to relieve symptoms and prevent problems in the future.  Cortisone injections to relieve severe pain.  Extracorporeal shock wave therapy (ESWT) to stimulate damaged plantar fascia with electrical impulses. It is often used as a last resort before surgery.  Surgery, if other treatments have not worked after 12 months.  Follow these instructions at home:  Take medicines only as directed by your health care provider.  Avoid activities that cause pain.  Roll the bottom of your foot over a bag of ice or a bottle of cold water. Do this for 20 minutes, 3-4 times a day.  Perform simple stretches as directed by your health care provider.  Try wearing athletic shoes with air-sole or gel-sole cushions or soft shoe inserts.  Wear a night splint while sleeping, if directed by your health care provider.  Keep all follow-up appointments with your health care provider. How is this prevented?  Do not perform exercises or activities that cause heel pain.  Consider finding low-impact activities if you continue to have problems.  Lose weight if you need to. The best way to prevent plantar fasciitis is to avoid the activities that aggravate your plantar fascia. Contact a health care provider if:  Your symptoms do not go away after treatment with home care measures.  Your pain gets worse.  Your pain affects  your ability to move or do your daily activities. This information is not intended to replace advice given to you by your health care provider. Make sure you discuss any questions you have with your health care provider. Document Released: 06/29/2001 Document Revised: 03/08/2016 Document Reviewed: 08/14/2014 Elsevier Interactive Patient Education  2018 [employer].  Please be sure  medication list is accurate. If a new problem present, please set up appointment sooner than planned today.

## 2018-01-16 NOTE — Assessment & Plan Note (Signed)
She is now interested in taking startin/cholesterol medication.  So fr now she will try nonpharmacologic treatment for now. We will plan on checking cholesterol next visit.

## 2018-01-16 NOTE — Assessment & Plan Note (Signed)
About 5 pounds weight gain since 05/2017.   We discussed benefits of wt loss as well as adverse effects of obesity. Consistency with healthy diet and physical activity are recommended.

## 2018-01-17 LAB — BASIC METABOLIC PANEL
BUN: 15 mg/dL (ref 6–23)
CALCIUM: 9.6 mg/dL (ref 8.4–10.5)
CO2: 28 meq/L (ref 19–32)
CREATININE: 0.93 mg/dL (ref 0.40–1.20)
Chloride: 106 mEq/L (ref 96–112)
GFR: 79.64 mL/min (ref >60.00)
GLUCOSE: 84 mg/dL (ref 70–99)
Potassium: 4.1 mEq/L (ref 3.5–5.1)
SODIUM: 141 meq/L (ref 135–145)

## 2018-01-17 LAB — HEMOGLOBIN A1C: HEMOGLOBIN A1C: 6.9 % — AB (ref 4.6–6.5)

## 2018-01-17 LAB — MICROALBUMIN / CREATININE URINE RATIO
Creatinine,U: 331.7 mg/dL
Microalb Creat Ratio: 1.1 mg/g (ref 0.0–30.0)
Microalb, Ur: 3.5 mg/dL — ABNORMAL HIGH (ref 0.0–1.9)

## 2018-01-17 LAB — HIV ANTIBODY (ROUTINE TESTING W REFLEX): HIV: NONREACTIVE

## 2018-01-21 ENCOUNTER — Encounter: Payer: Self-pay | Admitting: Family Medicine

## 2018-02-13 ENCOUNTER — Other Ambulatory Visit: Payer: Self-pay | Admitting: Adult Health

## 2018-02-13 DIAGNOSIS — M25569 Pain in unspecified knee: Principal | ICD-10-CM

## 2018-02-13 DIAGNOSIS — G8929 Other chronic pain: Secondary | ICD-10-CM

## 2018-02-15 ENCOUNTER — Encounter: Payer: Self-pay | Admitting: Family Medicine

## 2018-05-05 ENCOUNTER — Encounter: Payer: Self-pay | Admitting: *Deleted

## 2018-05-05 ENCOUNTER — Encounter: Payer: Self-pay | Admitting: Family Medicine

## 2018-05-05 ENCOUNTER — Ambulatory Visit: Payer: Self-pay | Admitting: *Deleted

## 2018-05-05 ENCOUNTER — Ambulatory Visit: Payer: BLUE CROSS/BLUE SHIELD | Admitting: Family Medicine

## 2018-05-05 ENCOUNTER — Telehealth: Payer: Self-pay | Admitting: *Deleted

## 2018-05-05 VITALS — BP 140/90 | HR 83 | Temp 98.5°F | Resp 12 | Ht 67.0 in | Wt 292.2 lb

## 2018-05-05 DIAGNOSIS — R600 Localized edema: Secondary | ICD-10-CM

## 2018-05-05 DIAGNOSIS — Z6841 Body Mass Index (BMI) 40.0 and over, adult: Secondary | ICD-10-CM

## 2018-05-05 DIAGNOSIS — I10 Essential (primary) hypertension: Secondary | ICD-10-CM | POA: Diagnosis not present

## 2018-05-05 MED ORDER — FUROSEMIDE 20 MG PO TABS: 20.0000 mg | ORAL_TABLET | Freq: Every day | ORAL | 2 refills | Status: DC | PRN

## 2018-05-05 NOTE — Patient Instructions (Addendum)
A few things to remember from today's visit:   Essential hypertension  Bilateral lower extremity edema - Plan: furosemide (LASIX) 20 MG tablet  Fluid pil daily for a week then as needed.   Vein disease is a condition that can affect the veins in the legs. It can cause leg pain, varicose veins, swollen legs, or open sores. Varicose veins are swollen and twisted veins. Things that may help: leg exercises (ankle flexion, walking),compression stocking, OTC horse chestnut seed extract 300 mg twice daily, for itchy skin cortisone and moisturizers.  Compression stockings- [facility] in [place]  Please be sure medication list is accurate. If a new problem present, please set up appointment sooner than planned today.

## 2018-05-05 NOTE — Telephone Encounter (Signed)
   Reason for Disposition . [1] MODERATE leg swelling (e.g., swelling extends up to knees) AND [2] new onset or worsening  Answer Assessment - Initial Assessment Questions 1. ONSET: "When did the swelling start?" (e.g., minutes, hours, days)     2 weeks 2. LOCATION: "What part of the leg is swollen?"  "Are both legs swollen or just one leg?"     Shaft of legs ankles and feet- knees- bilateral- left leg is worse 3. SEVERITY: "How bad is the swelling?" (e.g., localized; mild, moderate, severe)  - Localized - small area of swelling localized to one leg  - MILD pedal edema - swelling limited to foot and ankle, pitting edema < 1/4 inch (6 mm) deep, rest and elevation eliminate most or all swelling  - MODERATE edema - swelling of lower leg to knee, pitting edema > 1/4 inch (6 mm) deep, rest and elevation only partially reduce swelling  - SEVERE edema - swelling extends above knee, facial or hand swelling present      moderate 4. REDNESS: "Does the swelling look red or infected?"     no 5. PAIN: "Is the swelling painful to touch?" If so, ask: "How painful is it?"   (Scale 1-10; mild, moderate or severe)     Pain on left foot( 3-4)- numbness on left foot due to tightness 6. FEVER: "Do you have a fever?" If so, ask: "What is it, how was it measured, and when did it start?"      no 7. CAUSE: "What do you think is causing the leg swelling?"     New job, stress 8. MEDICAL HISTORY: "Do you have a history of heart failure, kidney disease, liver failure, or cancer?"     no 9. RECURRENT SYMPTOM: "Have you had leg swelling before?" If so, ask: "When was the last time?" "What happened that time?"     Legs have swelled in the heat- medication and rest helped 10. OTHER SYMPTOMS: "Do you have any other symptoms?" (e.g., chest pain, difficulty breathing)       no 11. PREGNANCY: "Is there any chance you are pregnant?" "When was your last menstrual period?"       LMP- June -  Not sexually active  Protocols  used: LEG SWELLING AND EDEMA-A-AH

## 2018-05-05 NOTE — Telephone Encounter (Signed)
Copied from [place] [id number] [id number]. Topic: General - Other >> May 05, 2018 12:56 PM Keene Breath wrote: Reason for CRM: Patient called to request a return to work note from her earlier office visit with doctor.  She stated that she forgot to get the written note.  Patient would like for it to be sent to My Chart or she can pick it up.  Please advise.  CB# [phone]

## 2018-05-05 NOTE — Telephone Encounter (Signed)
Patient informed that work note is completed and can be printed through my chart. Patient verbalized understanding.

## 2018-05-05 NOTE — Progress Notes (Signed)
ACUTE VISIT   HPI:  Chief Complaint  Patient presents with  . Foot Swelling    bilaterl foot, ankle and leg swelling for about 3 weeks off and on  . Numbness    in left foot    [name] is a 57 y.o. female with history of DM 2 with neuropathic complications, hypertension, and chronic knee pain, who is here today complaining of about 3 weeks of bilateral lower extremity edema.  Is exacerbated by prolonged standing, worse at the end of the day. Alleviated by LE elevation, resolved in the morning when she first gets up.  Negative for decreased urine output, gross hematuria, forming the urine, orthopnea, PND, lower extremity erythema or pain. Denies associated chest pain, palpitations, dyspnea, or diaphoresis.  She has not tried OTC medications but she has tried to increase water intake and using detox tea.   She states that she had similar problem last year around summer.  She has compression stockings but have not worn them.  Mentions occasional  left foot numbness (around ankle and some on dorsum), negative for focal weakness. She has had some back pain, no radiated, no associated saddle anesthesia or urine/bowel incontinence.  She takes meloxicam daily as needed, she states that she does not take it very often.  Negative for fever, chills, changes in appetite. She has not had a long travel recently, she is not on hormonal therapy, she has not had surgery.   She is concerned about gaining weight. She acknowledges that she has not been consistent with a healthy diet, she likes sugary drinks during summer. She has not been exercising regularly. She wonders if she is retaining fluids.   Hypertension: Today BP mildly elevated. She does not check BP regularly at home. Currently she is on amlodipine 5 mg daily and lisinopril She has skipped medication some days but has been more consistent for the past couple days.  Lab Results  Component Value Date   CREATININE 0.93 01/16/2018   BUN 15 01/16/2018   NA 141 01/16/2018   K 4.1 01/16/2018   CL 106 01/16/2018   CO2 28 01/16/2018     Review of Systems  Constitutional: Positive for unexpected weight change. Negative for activity change, appetite change, fatigue and fever.  HENT: Negative for mouth sores, nosebleeds and trouble swallowing.   Eyes: Negative for redness and visual disturbance.  Respiratory: Negative for cough, shortness of breath and wheezing.   Cardiovascular: Positive for leg swelling. Negative for chest pain and palpitations.  Gastrointestinal: Negative for abdominal pain, nausea and vomiting.       Negative for changes in bowel habits.  Genitourinary: Negative for decreased urine volume, dysuria and hematuria.  Skin: Negative for pallor and rash.  Neurological: Positive for numbness. Negative for syncope, weakness and headaches.      Current Outpatient Medications on File Prior to Visit  Medication Sig Dispense Refill  . amLODipine (NORVASC) 5 MG tablet Take 1 tablet (5 mg total) by mouth daily. 90 tablet 2  . glucose blood (ONETOUCH VERIO) test strip Use to test blood sugars once daily. 100 each 5  . lisinopril (PRINIVIL,ZESTRIL) 20 MG tablet Take 1 tablet (20 mg total) by mouth daily. 90 tablet 2  . meloxicam (MOBIC) 15 MG tablet TAKE 1 TABLET BY MOUTH EVERY DAY 90 tablet 0  . metFORMIN (GLUCOPHAGE-XR) 500 MG 24 hr tablet Take 1 tablet (500 mg total) by mouth 2 (two) times daily with a meal. 180 tablet  2  . ONE TOUCH LANCETS MISC Use to test blood sugars once daily. 100 each 5   No current facility-administered medications on file prior to visit.      Past Medical History:  Diagnosis Date  . Arthritis   . Chronic low back pain   . Diabetes mellitus without complication ([place])   . Hypertension   . Obesity    No Known Allergies  Social History   Socioeconomic History  . Marital status: Single    Spouse name: Not on file  . Number of children: Not on  file  . Years of education: Not on file  . Highest education level: Not on file  Occupational History  . 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 Financial resource strain: Not on file  . Food insecurity:    Worry: Not on file    Inability: Not on file  . Transportation needs:    Medical: Not on file    Non-medical: Not on file  Tobacco Use  . Smoking status: Never Smoker  . Smokeless tobacco: Never Used  Substance and Sexual Activity  . Alcohol use: No  . Drug use: No  . Sexual activity: Not on file  Lifestyle  . Physical activity:    Days per week: Not on file    Minutes per session: Not on file  . Stress: Not on file  Relationships  . Social connections:    Talks on phone: Not on file    Gets together: Not on file    Attends religious service: Not on file    Active member of club or organization: Not on file    Attends meetings of clubs or organizations: Not on file    Relationship status: Not on file  Other Topics Concern  . Not on file  Social History Narrative  . Not on file    Vitals:   05/05/18 1054  BP: 140/90  Pulse: 83  Resp: 12  Temp: 98.5 F (36.9 C)  SpO2: 96%   Body mass index is 45.77 kg/m.   Wt Readings from Last 3 Encounters:  05/05/18 292 lb 4 oz (132.6 kg)  01/16/18 283 lb 9.6 oz (128.6 kg)  06/13/17 278 lb (126.1 kg)     Physical Exam  Nursing note and vitals reviewed. Constitutional: She is oriented to person, place, and time. She appears well-developed. No distress.  HENT:  Head: Normocephalic and atraumatic.  Mouth/Throat: Oropharynx is clear and moist and mucous membranes are normal.  Eyes: Pupils are equal, round, and reactive to light. Conjunctivae are normal.  Cardiovascular: Normal rate and regular rhythm.  No murmur heard. Pulses:      Dorsalis pedis pulses are 2+ on the right side, and 2+ on the left side.  Bevelyn Buckles' sign negative,  bilaterally.  Respiratory: Effort normal and breath sounds normal. No respiratory distress.  GI: Soft. She exhibits no mass. There is no hepatomegaly. There is no tenderness.  Musculoskeletal: She exhibits edema (LE and peri ankle edema,2+ pitting,bilateral). She exhibits no tenderness.  Lymphadenopathy:    She has no cervical adenopathy.  Neurological: She is alert and oriented to person, place, and time. She has normal strength. Gait normal.  Skin: Skin is warm. No rash noted. No erythema.  Psychiatric: She has a normal mood and affect.  Well groomed, good eye contact.     ASSESSMENT AND PLAN:   [name] was seen today  for foot swelling and numbness.  Diagnoses and all orders for this visit:  Bilateral lower extremity edema  We discussed possible etiologies. The likelihood of this being a serious process like DVT is low, so at this time I do not think imaging or lab work are needed. Most likely related to vein disease and aggravated by medication, including amlodipine and meloxicam.  Recommend wearing compression stockings and/or lower extremity elevation above waist level. After discussion of pharmacologic treatment options as well as some side effects, she would like to take diuretic as needed. She will take furosemide 20 mg daily for 7 days then she will continue daily as needed. Clearly instructed about warning signs.  -     furosemide (LASIX) 20 MG tablet; Take 1 tablet (20 mg total) by mouth daily as needed.  Essential hypertension  For now no changes in current management. Recommend checking BP at home. Low-salt diet. We discussed some side effects of amlodipine. Keep next follow-up appointment.  Class 3 severe obesity due to excess calories with serious comorbidity and body mass index (BMI) of 45.0 to 49.9 in adult [facility])  We discussed benefits of wt loss as well as adverse effects of obesity. I do not think her weight gain is related to "fluid retention." She  acknowledges she has not been good with diet and planning on doing better.  Consistency with healthy diet and physical activity recommended.       Return if symptoms worsen or fail to improve, for Keep next appt in 07/2018.     Jordan Cossey G. [place], MD  [facility]. [place] office.

## 2018-05-08 ENCOUNTER — Emergency Department (HOSPITAL_BASED_OUTPATIENT_CLINIC_OR_DEPARTMENT_OTHER)
Admission: EM | Admit: 2018-05-08 | Discharge: 2018-05-08 | Disposition: A | Discharge status: Home / Self Care | Payer: BLUE CROSS/BLUE SHIELD | Attending: Emergency Medicine | Admitting: Emergency Medicine

## 2018-05-08 ENCOUNTER — Other Ambulatory Visit: Payer: Self-pay

## 2018-05-08 ENCOUNTER — Encounter (HOSPITAL_BASED_OUTPATIENT_CLINIC_OR_DEPARTMENT_OTHER): Payer: Self-pay

## 2018-05-08 ENCOUNTER — Emergency Department (HOSPITAL_BASED_OUTPATIENT_CLINIC_OR_DEPARTMENT_OTHER): Payer: BLUE CROSS/BLUE SHIELD

## 2018-05-08 DIAGNOSIS — I10 Essential (primary) hypertension: Secondary | ICD-10-CM | POA: Diagnosis not present

## 2018-05-08 DIAGNOSIS — E119 Type 2 diabetes mellitus without complications: Secondary | ICD-10-CM | POA: Diagnosis not present

## 2018-05-08 DIAGNOSIS — R1032 Left lower quadrant pain: Secondary | ICD-10-CM | POA: Diagnosis not present

## 2018-05-08 DIAGNOSIS — D259 Leiomyoma of uterus, unspecified: Secondary | ICD-10-CM | POA: Insufficient documentation

## 2018-05-08 DIAGNOSIS — J45909 Unspecified asthma, uncomplicated: Secondary | ICD-10-CM | POA: Diagnosis not present

## 2018-05-08 LAB — CBC WITH DIFFERENTIAL/PLATELET
BASOS ABS: 0 10*3/uL (ref 0.0–0.1)
Basophils Relative: 0 %
EOS ABS: 0.1 10*3/uL (ref 0.0–0.7)
Eosinophils Relative: 1 %
HCT: 42.2 % (ref 36.0–46.0)
HEMOGLOBIN: 14.3 g/dL (ref 12.0–15.0)
LYMPHS ABS: 2.1 10*3/uL (ref 0.7–4.0)
LYMPHS PCT: 35 %
MCH: 30 pg (ref 26.0–34.0)
MCHC: 33.9 g/dL (ref 30.0–36.0)
MCV: 88.5 fL (ref 78.0–100.0)
Monocytes Absolute: 0.8 10*3/uL (ref 0.1–1.0)
Monocytes Relative: 13 %
NEUTROS PCT: 51 %
Neutro Abs: 3 10*3/uL (ref 1.7–7.7)
Platelets: 181 10*3/uL (ref 150–400)
RBC: 4.77 MIL/uL (ref 3.87–5.11)
RDW: 13.9 % (ref 11.5–15.5)
WBC: 5.9 10*3/uL (ref 4.0–10.5)

## 2018-05-08 LAB — COMPREHENSIVE METABOLIC PANEL
ALT: 25 U/L (ref 0–44)
ANION GAP: 6 (ref 5–15)
AST: 24 U/L (ref 15–41)
Albumin: 3.5 g/dL (ref 3.5–5.0)
Alkaline Phosphatase: 79 U/L (ref 38–126)
BUN: 18 mg/dL (ref 6–20)
CALCIUM: 8.9 mg/dL (ref 8.9–10.3)
CHLORIDE: 105 mmol/L (ref 98–111)
CO2: 29 mmol/L (ref 22–32)
Creatinine, Ser: 1.05 mg/dL — ABNORMAL HIGH (ref 0.44–1.00)
GFR, EST NON AFRICAN AMERICAN: 57 mL/min — AB (ref >60)
Glucose, Bld: 137 mg/dL — ABNORMAL HIGH (ref 70–99)
Potassium: 3.9 mmol/L (ref 3.5–5.1)
SODIUM: 140 mmol/L (ref 135–145)
Total Bilirubin: 0.5 mg/dL (ref 0.3–1.2)
Total Protein: 7.2 g/dL (ref 6.5–8.1)

## 2018-05-08 LAB — PREGNANCY, URINE: PREG TEST UR: NEGATIVE

## 2018-05-08 LAB — URINALYSIS, ROUTINE W REFLEX MICROSCOPIC
BILIRUBIN URINE: NEGATIVE
Glucose, UA: NEGATIVE mg/dL
HGB URINE DIPSTICK: NEGATIVE
Ketones, ur: NEGATIVE mg/dL
Leukocytes, UA: NEGATIVE
Nitrite: NEGATIVE
PROTEIN: 30 mg/dL — AB
Specific Gravity, Urine: >1.03 — ABNORMAL HIGH (ref 1.005–1.030)
pH: 5.5 (ref 5.0–8.0)

## 2018-05-08 LAB — URINALYSIS, MICROSCOPIC (REFLEX)

## 2018-05-08 LAB — LIPASE, BLOOD: LIPASE: 38 U/L (ref 11–51)

## 2018-05-08 MED ORDER — KETOROLAC TROMETHAMINE 30 MG/ML IJ SOLN
30.0000 mg | Freq: Once | INTRAMUSCULAR | Status: AC
  Administered 2018-05-08: 30 mg via INTRAVENOUS
  Filled 2018-05-08: qty 1

## 2018-05-08 MED ORDER — IBUPROFEN 800 MG PO TABS: 800.0000 mg | ORAL_TABLET | Freq: Three times a day (TID) | ORAL | 0 refills | Status: DC | PRN

## 2018-05-08 NOTE — ED Triage Notes (Signed)
C/o LLQ pain x today-denies n/v/d-NAD-steady gait

## 2018-05-08 NOTE — ED Notes (Signed)
Attempted to discharge pt but she said the pain is back and is hurting down her left leg now.  Wants to speak to edp again.  Dr. Laverta Baltimore notified.

## 2018-05-08 NOTE — Discharge Instructions (Signed)

## 2018-05-08 NOTE — ED Provider Notes (Signed)
Emergency Department Provider Note   I have reviewed the triage vital signs and the nursing notes.   HISTORY  Chief Complaint Abdominal Pain   HPI Jordan Ramirez is a 57 y.o. female with PMH of DM and HTN since to the emergency department for evaluation of left lower quadrant abdominal pain which began today.  The patient has had prior pain in the past related to menstrual cycle onset.  She states sometimes is on the right and other times on the left.  She denies any radiation of symptoms.  The pain is moderately severe and constant.  No sudden, severe worsening pain symptoms.  Denies any vaginal discharge.  No UTI symptoms.  No upper abdominal pain, nausea, vomiting.    Past Medical History:  Diagnosis Date  . Arthritis   . Chronic low back pain   . Diabetes mellitus without complication ([place])   . Hypertension   . Obesity     Patient Active Problem List   Diagnosis Date Noted  . Ganglion cyst of dorsum of right wrist 01/16/2018  . Bilateral knee pain 04/17/2012  . Pain of left heel 04/17/2012  . Diabetes mellitus type 2 with neurological manifestations ([place]) 08/28/2009  . THUMB PAIN, LEFT 08/27/2009  . Hyperlipemia 08/20/2009  . Class 3 severe obesity due to excess calories with serious comorbidity and body mass index (BMI) of 45.0 to 49.9 in adult ([place]) 08/20/2009  . ANEMIA-NOS 08/20/2009  . Essential hypertension 08/20/2009  . ALLERGIC RHINITIS 08/20/2009  . ASTHMA 08/20/2009  . MENORRHAGIA, PERIMENOPAUSAL 08/20/2009  . DEGENERATIVE JOINT DISEASE, KNEE 08/20/2009  . TENDINITIS, LEFT THUMB 08/20/2009    Past Surgical History:  Procedure Laterality Date  . PILONIDAL CYST EXCISION    . WISDOM TOOTH EXTRACTION      Allergies Patient has no known allergies.  Family History  Problem Relation Age of Onset  . Coronary artery disease Mother   . Diabetes type II Mother   . Depression Mother   . Hypertension Mother   . Kidney failure Father     Social  History Social History   Tobacco Use  . Smoking status: Never Smoker  . Smokeless tobacco: Never Used  Substance Use Topics  . Alcohol use: No  . Drug use: No    Review of Systems  Constitutional: No fever/chills Eyes: No visual changes. ENT: No sore throat. Cardiovascular: Denies chest pain. Respiratory: Denies shortness of breath. Gastrointestinal: Positive LLQ abdominal pain.  No nausea, no vomiting.  No diarrhea.  No constipation. Genitourinary: Negative for dysuria. Musculoskeletal: Negative for back pain. Skin: Negative for rash. Neurological: Negative for headaches, focal weakness or numbness.  10-point ROS otherwise negative.  ____________________________________________   PHYSICAL EXAM:  VITAL SIGNS: ED Triage Vitals  Enc Vitals Group     BP 05/08/18 1642 (!) 161/95     Pulse Rate 05/08/18 1642 (!) 104     Resp 05/08/18 1642 18     Temp 05/08/18 1642 98.7 F (37.1 C)     Temp Source 05/08/18 1642 Oral     SpO2 05/08/18 1642 98 %     Weight 05/08/18 1640 291 lb (132 kg)     Height 05/08/18 1640 5\' 7"  (1.702 m)     Pain Score 05/08/18 1641 4   Constitutional: Alert and oriented. Well appearing and in no acute distress. Eyes: Conjunctivae are normal Head: Atraumatic. Nose: No congestion/rhinnorhea. Mouth/Throat: Mucous membranes are moist.  Oropharynx non-erythematous. Neck: No stridor.  Cardiovascular: Normal rate, regular rhythm.  Good peripheral circulation. Grossly normal heart sounds.   Respiratory: Normal respiratory effort.  No retractions. Lungs CTAB. Gastrointestinal: Soft with very mild LLQ abdominal pain. No distention.  Musculoskeletal: No lower extremity tenderness nor edema. No gross deformities of extremities. Neurologic:  Normal speech and language. No gross focal neurologic deficits are appreciated.  Skin:  Skin is warm, dry and intact. No rash noted.   ____________________________________________   LABS (all labs ordered are listed,  but only abnormal results are displayed)  Labs Reviewed  URINALYSIS, ROUTINE W REFLEX MICROSCOPIC - Abnormal; Notable for the following components:      Result Value   Specific Gravity, Urine >1.030 (*)    Protein, ur 30 (*)    All other components within normal limits  COMPREHENSIVE METABOLIC PANEL - Abnormal; Notable for the following components:   Glucose, Bld 137 (*)    Creatinine, Ser 1.05 (*)    GFR calc non Af Amer 57 (*)    All other components within normal limits  URINALYSIS, MICROSCOPIC (REFLEX) - Abnormal; Notable for the following components:   Bacteria, UA RARE (*)    All other components within normal limits  LIPASE, BLOOD  CBC WITH DIFFERENTIAL/PLATELET  PREGNANCY, URINE   ____________________________________________  RADIOLOGY  Ct Renal Stone Study  Result Date: 05/08/2018 CLINICAL DATA:  Left lower quadrant pain radiating into the groin. EXAM: CT ABDOMEN AND PELVIS WITHOUT CONTRAST TECHNIQUE: Multidetector CT imaging of the abdomen and pelvis was performed following the standard protocol without IV contrast. COMPARISON:  None. FINDINGS: Lower chest: No acute abnormality. Subsegmental atelectasis versus scar in the medial right lower lobe. Hepatobiliary: No focal liver abnormality is seen. No gallstones, gallbladder wall thickening, or biliary dilatation. Pancreas: Unremarkable. No pancreatic ductal dilatation or surrounding inflammatory changes. Spleen: Normal in size without focal abnormality. Adrenals/Urinary Tract: Adrenal glands are unremarkable. Kidneys are normal, without renal calculi, focal lesion, or hydronephrosis. Bladder is unremarkable. Stomach/Bowel: Stomach is within normal limits. Appendix appears normal. No evidence of bowel wall thickening, distention, or inflammatory changes. Vascular/Lymphatic: No significant vascular findings are present. No enlarged abdominal or pelvic lymph nodes. Reproductive: Fibroid uterus.  The ovaries are unremarkable. Other: No  abdominal wall hernia or abnormality. No abdominopelvic ascites. No pneumoperitoneum. Musculoskeletal: No acute or significant osseous findings. Advanced lower lumbar facet arthropathy. IMPRESSION: 1.  No acute intra-abdominal process.  No renal or ureteral calculi. 2. Fibroid uterus. Consider non-emergent pelvic ultrasound for better evaluation. Electronically Signed   By: Titus Dubin M.D.   On: 05/08/2018 19:39    ____________________________________________   PROCEDURES  Procedure(s) performed:   Procedures  None ____________________________________________   INITIAL IMPRESSION / ASSESSMENT AND PLAN / ED COURSE  Pertinent labs & imaging results that were available during my care of the patient were reviewed by me and considered in my medical decision making (see chart for details).  Patient presents to the emergency department for evaluation of left lower quadrant abdominal pain.  This pain is familiar to her and occurs with menstrual cycles.  She is perimenopausal at this time.  No active vaginal bleeding.  No significant tenderness on exam.  Plan for screening labs, UA, Toradol for pain, reassess.  My suspicion for ovarian torsion is extremely low.   Labs reviewed with no acute findings.  No UTI.  Patient continuing to have some left lower quadrant and left flank pain.  CT renal obtained to rule out kidney stone.  This was reviewed which showed no stone but does show uterine fibroids.  Radiology recommends nonemergent outpatient ultrasound.  Advised the patient call her OB/GYN tomorrow to schedule an outpatient appointment.  Patient's pain is well controlled.  Will discharge home with Motrin and plan for warm compress for comfort.   At this time, I do not feel there is any life-threatening condition present. I have reviewed and discussed all results (EKG, imaging, lab, urine as appropriate), exam findings with patient. I have reviewed nursing notes and appropriate previous records.   I feel the patient is safe to be discharged home without further emergent workup. Discussed usual and customary return precautions. Patient and family (if present) verbalize understanding and are comfortable with this plan.  Patient will follow-up with their primary care provider. If they do not have a primary care provider, information for follow-up has been provided to them. All questions have been answered.  ____________________________________________  FINAL CLINICAL IMPRESSION(S) / ED DIAGNOSES  Final diagnoses:  Left lower quadrant pain     MEDICATIONS GIVEN DURING THIS VISIT:  Medications  ketorolac (TORADOL) 30 MG/ML injection 30 mg (30 mg Intravenous Given 05/08/18 1728)     NEW OUTPATIENT MEDICATIONS STARTED DURING THIS VISIT:  Discharge Medication List as of 05/08/2018  6:07 PM    START taking these medications   Details  ibuprofen (ADVIL,MOTRIN) 800 MG tablet Take 1 tablet (800 mg total) by mouth every 8 (eight) hours as needed., Starting Mon 05/08/2018, Normal        Note:  This document was prepared using Dragon voice recognition software and may include unintentional dictation errors.  Nanda Quinton, MD Emergency Medicine    Long, [facility], MD 05/08/18 2101

## 2018-05-10 ENCOUNTER — Ambulatory Visit: Payer: BLUE CROSS/BLUE SHIELD | Admitting: Adult Health

## 2018-05-10 ENCOUNTER — Ambulatory Visit (INDEPENDENT_AMBULATORY_CARE_PROVIDER_SITE_OTHER): Payer: BLUE CROSS/BLUE SHIELD

## 2018-05-10 ENCOUNTER — Encounter: Payer: Self-pay | Admitting: Adult Health

## 2018-05-10 ENCOUNTER — Encounter: Payer: Self-pay | Admitting: Family Medicine

## 2018-05-10 VITALS — BP 170/100 | Temp 99.2°F | Wt 292.0 lb

## 2018-05-10 DIAGNOSIS — M5442 Lumbago with sciatica, left side: Secondary | ICD-10-CM

## 2018-05-10 DIAGNOSIS — M79605 Pain in left leg: Secondary | ICD-10-CM

## 2018-05-10 DIAGNOSIS — M4807 Spinal stenosis, lumbosacral region: Secondary | ICD-10-CM | POA: Diagnosis not present

## 2018-05-10 MED ORDER — METHYLPREDNISOLONE 4 MG PO TBPK: ORAL_TABLET | ORAL | 0 refills | Status: DC

## 2018-05-10 MED ORDER — CYCLOBENZAPRINE HCL 10 MG PO TABS: 10.0000 mg | ORAL_TABLET | Freq: Every day | ORAL | 0 refills | Status: DC

## 2018-05-10 NOTE — Progress Notes (Signed)
Subjective:    Patient ID: Jordan Ramirez, female    DOB: [birth year], 57 y.o.   MRN: [id number]  HPI  57 year old female who  has a past medical history of Arthritis, Chronic low back pain, Diabetes mellitus without complication ([place]), Hypertension, and Obesity.  Presents to the office today for follow up after being seen in the ER two days ago left lower quadrant pain.  Abs did not show any acute finding, she had no UTI.  CT renal was obtained to rule out kidney stone but did show uterine fibroids.  Radiology recommended nonemergent outpatient ultrasound and she was advised to follow with her OB/GYN.  Today in the office she reports that the pain is not in her abdomen but rather her left groin.  She reports that the pain starts in her low back/buttocks and radiates into her left groin and down her leg.  This pain is felt to be "cramping and dull" and can be " shooting" at times.  Pain is constant but worse sitting for extended periods of time but also has pain when ambulating.  He denies any loss of bowel or bladder, has no nausea vomiting or diarrhea.  Per patient "I have had these uterine fibroids my entire life and I know what the pain feels like, this is not it".  She has been using prescribed medications without much relief   Review of Systems See HPI   Past Medical History:  Diagnosis Date  . Arthritis   . Chronic low back pain   . Diabetes mellitus without complication ([place])   . Hypertension   . Obesity     Social History   Socioeconomic History  . Marital status: Single    Spouse name: Not on file  . Number of children: Not on file  . Years of education: Not on file  . Highest education level: Not on file  Occupational History  . 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 Financial resource strain: Not on file  . Food insecurity:    Worry: Not on file    Inability: Not on file  .  Transportation needs:    Medical: Not on file    Non-medical: Not on file  Tobacco Use  . Smoking status: Never Smoker  . Smokeless tobacco: Never Used  Substance and Sexual Activity  . Alcohol use: No  . Drug use: No  . Sexual activity: Not on file  Lifestyle  . Physical activity:    Days per week: Not on file    Minutes per session: Not on file  . Stress: Not on file  Relationships  . Social connections:    Talks on phone: Not on file    Gets together: Not on file    Attends religious service: Not on file    Active member of club or organization: Not on file    Attends meetings of clubs or organizations: Not on file    Relationship status: Not on file  . Intimate partner violence:    Fear of current or ex partner: Not on file    Emotionally abused: Not on file    Physically abused: Not on file    Forced sexual activity: Not on file  Other Topics Concern  . Not on file  Social History Narrative  . Not on file    Past Surgical History:  Procedure Laterality Date  . PILONIDAL CYST EXCISION    .  WISDOM TOOTH EXTRACTION      Family History  Problem Relation Age of Onset  . Coronary artery disease Mother   . Diabetes type II Mother   . Depression Mother   . Hypertension Mother   . Kidney failure Father     No Known Allergies  Current Outpatient Medications on File Prior to Visit  Medication Sig Dispense Refill  . amLODipine (NORVASC) 5 MG tablet Take 1 tablet (5 mg total) by mouth daily. 90 tablet 2  . Cholecalciferol (VITAMIN D PO) Take 500 Units by mouth daily.    . furosemide (LASIX) 20 MG tablet Take 1 tablet (20 mg total) by mouth daily as needed. 30 tablet 2  . glucose blood (ONETOUCH VERIO) test strip Use to test blood sugars once daily. 100 each 5  . ibuprofen (ADVIL,MOTRIN) 800 MG tablet Take 1 tablet (800 mg total) by mouth every 8 (eight) hours as needed. 21 tablet 0  . lisinopril (PRINIVIL,ZESTRIL) 20 MG tablet Take 1 tablet (20 mg total) by mouth  daily. 90 tablet 2  . meloxicam (MOBIC) 15 MG tablet TAKE 1 TABLET BY MOUTH EVERY DAY 90 tablet 0  . metFORMIN (GLUCOPHAGE-XR) 500 MG 24 hr tablet Take 1 tablet (500 mg total) by mouth 2 (two) times daily with a meal. 180 tablet 2  . ONE TOUCH LANCETS MISC Use to test blood sugars once daily. 100 each 5   No current facility-administered medications on file prior to visit.     BP (!) 170/100 (BP Location: Left Wrist)   Temp 99.2 F (37.3 C) (Oral)   Wt 292 lb (132.5 kg) Comment: Weight taken from OV on 05/05/18  BMI 45.73 kg/m       Objective:   Physical Exam  Constitutional: She is oriented to person, place, and time. She appears well-developed and well-nourished. No distress.  Appears in discomfort    Eyes: Pupils are equal, round, and reactive to light. EOM are normal.  Cardiovascular: Normal rate, regular rhythm, normal heart sounds and intact distal pulses.  Pulmonary/Chest: Effort normal and breath sounds normal.  Musculoskeletal: Normal range of motion. She exhibits no edema, tenderness or deformity.  Neurological: She is alert and oriented to person, place, and time. She displays normal reflexes. No cranial nerve deficit or sensory deficit. She exhibits normal muscle tone. Coordination normal.  Skin: Skin is warm and dry. Capillary refill takes less than 2 seconds. She is not diaphoretic.  Psychiatric: She has a normal mood and affect. Her behavior is normal. Judgment and thought content normal.  Nursing note and vitals reviewed.     Assessment & Plan:  Exam appears to beMore related to sciatica along the L5 and S1/S2 regions.  I do not believe this is related to her uterine fibroids.  Will get lumbar spine x-ray and prescribe a Medrol Dosepak with muscle relaxers.  I imagine her increase in blood pressure is due to pain.  I am not going to change her blood pressure medications at this time -Advised follow-up with PCP if no improvement in the next 2 or 3 days  Dorothyann Peng, NP

## 2018-05-13 ENCOUNTER — Other Ambulatory Visit: Payer: Self-pay | Admitting: Family Medicine

## 2018-05-13 DIAGNOSIS — G8929 Other chronic pain: Secondary | ICD-10-CM

## 2018-05-13 DIAGNOSIS — M25569 Pain in unspecified knee: Principal | ICD-10-CM

## 2018-05-15 ENCOUNTER — Encounter (HOSPITAL_COMMUNITY): Payer: Self-pay | Admitting: Family Medicine

## 2018-05-15 ENCOUNTER — Emergency Department (HOSPITAL_COMMUNITY): Payer: BLUE CROSS/BLUE SHIELD

## 2018-05-15 ENCOUNTER — Emergency Department (HOSPITAL_COMMUNITY)
Admission: EM | Admit: 2018-05-15 | Discharge: 2018-05-15 | Disposition: A | Discharge status: Home / Self Care | Payer: BLUE CROSS/BLUE SHIELD | Attending: Emergency Medicine | Admitting: Emergency Medicine

## 2018-05-15 ENCOUNTER — Ambulatory Visit: Payer: BLUE CROSS/BLUE SHIELD | Admitting: Family Medicine

## 2018-05-15 DIAGNOSIS — M79605 Pain in left leg: Secondary | ICD-10-CM | POA: Diagnosis not present

## 2018-05-15 DIAGNOSIS — N8500 Endometrial hyperplasia, unspecified: Secondary | ICD-10-CM | POA: Diagnosis not present

## 2018-05-15 DIAGNOSIS — D219 Benign neoplasm of connective and other soft tissue, unspecified: Secondary | ICD-10-CM | POA: Diagnosis not present

## 2018-05-15 DIAGNOSIS — N390 Urinary tract infection, site not specified: Secondary | ICD-10-CM | POA: Diagnosis not present

## 2018-05-15 DIAGNOSIS — R9389 Abnormal findings on diagnostic imaging of other specified body structures: Secondary | ICD-10-CM

## 2018-05-15 DIAGNOSIS — E1149 Type 2 diabetes mellitus with other diabetic neurological complication: Secondary | ICD-10-CM | POA: Diagnosis not present

## 2018-05-15 DIAGNOSIS — M25552 Pain in left hip: Secondary | ICD-10-CM | POA: Diagnosis not present

## 2018-05-15 DIAGNOSIS — D259 Leiomyoma of uterus, unspecified: Secondary | ICD-10-CM | POA: Insufficient documentation

## 2018-05-15 DIAGNOSIS — I10 Essential (primary) hypertension: Secondary | ICD-10-CM | POA: Insufficient documentation

## 2018-05-15 DIAGNOSIS — S8992XA Unspecified injury of left lower leg, initial encounter: Secondary | ICD-10-CM | POA: Diagnosis not present

## 2018-05-15 LAB — URINALYSIS, ROUTINE W REFLEX MICROSCOPIC
Bilirubin Urine: NEGATIVE
Glucose, UA: NEGATIVE mg/dL
Hgb urine dipstick: NEGATIVE
KETONES UR: NEGATIVE mg/dL
Nitrite: NEGATIVE
PH: 5 (ref 5.0–8.0)
PROTEIN: NEGATIVE mg/dL
Specific Gravity, Urine: 1.02 (ref 1.005–1.030)

## 2018-05-15 MED ORDER — DIAZEPAM 5 MG PO TABS
5.0000 mg | ORAL_TABLET | Freq: Once | ORAL | Status: AC
  Administered 2018-05-15: 5 mg via ORAL
  Filled 2018-05-15: qty 1

## 2018-05-15 MED ORDER — METHOCARBAMOL 500 MG PO TABS: 500.0000 mg | ORAL_TABLET | Freq: Three times a day (TID) | ORAL | 0 refills | Status: DC | PRN

## 2018-05-15 MED ORDER — CEPHALEXIN 500 MG PO CAPS
500.0000 mg | ORAL_CAPSULE | Freq: Once | ORAL | Status: AC
  Administered 2018-05-15: 500 mg via ORAL
  Filled 2018-05-15: qty 1

## 2018-05-15 MED ORDER — TRAMADOL HCL 50 MG PO TABS: 50.0000 mg | ORAL_TABLET | Freq: Four times a day (QID) | ORAL | 0 refills | Status: DC | PRN

## 2018-05-15 MED ORDER — CEPHALEXIN 500 MG PO CAPS: 500.0000 mg | ORAL_CAPSULE | Freq: Three times a day (TID) | ORAL | 0 refills | Status: AC

## 2018-05-15 MED ORDER — KETOROLAC TROMETHAMINE 30 MG/ML IJ SOLN
30.0000 mg | Freq: Once | INTRAMUSCULAR | Status: AC
  Administered 2018-05-15: 30 mg via INTRAMUSCULAR
  Filled 2018-05-15: qty 1

## 2018-05-15 NOTE — ED Provider Notes (Signed)
  Physical Exam  BP (!) 172/95 (BP Location: Right Arm)   Pulse 69   Temp 98.9 F (37.2 C) (Oral)   Resp 18   Ht 5\' 7"  (1.702 m)   Wt 132.5 kg (292 lb)   SpO2 99% Comment: Room Air   BMI 45.73 kg/m   Physical Exam  ED Course/Procedures     Procedures  MDM  Patient care assumed at 4 pm. Patient was seen recently and diagnosed with sciatica but has more LLQ pain. Recent CT showed fibroids. Sign out pending UA, pelvic US.   5:17 PM Pelvic US showed large fibroids, borderline thickened endometrium. UA + UTI. Dr. Laverta Baltimore called in Keflex, tramadol, robaxin. Patient has OB follow up. Will have her follow up with spine as well.       Drenda Freeze, MD 05/15/18 [phone]

## 2018-05-15 NOTE — ED Triage Notes (Signed)
Patient reports she was seen at her PCP's office on Wednesday for same symptoms. Prescribed Prednisone and reports she has been taking medication.

## 2018-05-15 NOTE — Discharge Instructions (Signed)
You have fibroids and thickened lining of your uterus. That can cause some pain. Please call your GYN doctor for appointment.   You have UTI so please take keflex as prescribed.   Dr. Laverta Baltimore called in robaxin and ultram for pain   See spine doctor for follow up of your sciatica   See your primary care doctor  Return to ER if you have worse abdominal pain, back pain, leg pain, trouble urinating

## 2018-05-15 NOTE — ED Triage Notes (Signed)
Patient is from home and transported via [facility]. Patient is complaining of left hip pain that radiates to her leg. Patient was seen at [facility] a week ago, and diagnosed with sciatica pain. Patient was suppose to be seen PCP today for the pain but she reported to EMS she didn't think she could make it.

## 2018-05-15 NOTE — ED Provider Notes (Signed)
Emergency Department Provider Note   I have reviewed the triage vital signs and the nursing notes.   HISTORY  Chief Complaint Hip Pain   HPI Jordan Ramirez is a 57 y.o. female with PMH of HTN, DM, and chronic lower back pain since to the emergency department by EMS for evaluation of left buttock pain.  She denies significant radiation of pain into the leg.  Denies any weakness or numbness in the lower extremities.  No lower back pain.  She was evaluated in the emergency department on 7/22.  CT renal at that time showed possible fibroids but no other acute findings.  Patient denies any active vaginal bleeding.  No discharge.  She was scheduled to see her primary care physician today but because of pain was unable to make it.  She states initially the steroids given at the emergency department improved her symptoms but yesterday pain seemed to begin to worsen. Pain worse with movement.   Past Medical History:  Diagnosis Date  . Arthritis   . Chronic low back pain   . Diabetes mellitus without complication ([place])   . Hypertension   . Obesity     Patient Active Problem List   Diagnosis Date Noted  . Ganglion cyst of dorsum of right wrist 01/16/2018  . Bilateral knee pain 04/17/2012  . Pain of left heel 04/17/2012  . Diabetes mellitus type 2 with neurological manifestations ([place]) 08/28/2009  . THUMB PAIN, LEFT 08/27/2009  . Hyperlipemia 08/20/2009  . Class 3 severe obesity due to excess calories with serious comorbidity and body mass index (BMI) of 45.0 to 49.9 in adult ([place]) 08/20/2009  . ANEMIA-NOS 08/20/2009  . Essential hypertension 08/20/2009  . ALLERGIC RHINITIS 08/20/2009  . ASTHMA 08/20/2009  . MENORRHAGIA, PERIMENOPAUSAL 08/20/2009  . DEGENERATIVE JOINT DISEASE, KNEE 08/20/2009  . TENDINITIS, LEFT THUMB 08/20/2009    Past Surgical History:  Procedure Laterality Date  . PILONIDAL CYST EXCISION    . WISDOM TOOTH EXTRACTION      Allergies Patient has no known  allergies.  Family History  Problem Relation Age of Onset  . Coronary artery disease Mother   . Diabetes type II Mother   . Depression Mother   . Hypertension Mother   . Kidney failure Father     Social History Social History   Tobacco Use  . Smoking status: Never Smoker  . Smokeless tobacco: Never Used  Substance Use Topics  . Alcohol use: No  . Drug use: No    Review of Systems  Constitutional: No fever/chills Eyes: No visual changes. ENT: No sore throat. Cardiovascular: Denies chest pain. Respiratory: Denies shortness of breath. Gastrointestinal: No abdominal pain. No nausea, no vomiting. No diarrhea. No constipation. Genitourinary: Negative for dysuria. Musculoskeletal: Negative for back pain. Positive left buttock pain.  Skin: Negative for rash. Neurological: Negative for headaches, focal weakness or numbness.  10-point ROS otherwise negative.  ____________________________________________   PHYSICAL EXAM:  VITAL SIGNS: ED Triage Vitals  Enc Vitals Group     BP 05/15/18 1400 (!) 172/95     Pulse Rate 05/15/18 1400 69     Resp 05/15/18 1400 18     Temp 05/15/18 1400 98.9 F (37.2 C)     Temp Source 05/15/18 1400 Oral     SpO2 05/15/18 1400 98 %     Weight 05/15/18 1356 292 lb (132.5 kg)     Height 05/15/18 1356 5\' 7"  (1.702 m)     Pain Score 05/15/18 1354 2  Constitutional: Alert and oriented. Patient appears uncomfortable with frequent shifting in the bed.  Eyes: Conjunctivae are normal.  Head: Atraumatic. Nose: No congestion/rhinnorhea. Mouth/Throat: Mucous membranes are moist.  Neck: No stridor.  Cardiovascular: Normal rate, regular rhythm. Good peripheral circulation. Grossly normal heart sounds.   Respiratory: Normal respiratory effort.  No retractions. Lungs CTAB. Gastrointestinal: Soft and nontender. No distention.  Musculoskeletal: No lower extremity tenderness nor edema. No gross deformities of extremities. Neurologic:  Normal speech and  language. No gross focal neurologic deficits are appreciated. Normal patellar reflexes bilaterally.   Skin:  Skin is warm, dry and intact. No rash noted.   ____________________________________________   LABS (all labs ordered are listed, but only abnormal results are displayed)  Labs Reviewed  URINALYSIS, ROUTINE W REFLEX MICROSCOPIC - Abnormal; Notable for the following components:      Result Value   Leukocytes, UA TRACE (*)    Bacteria, UA MANY (*)    All other components within normal limits   ____________________________________________  RADIOLOGY  US Transvaginal Non-ob  Result Date: 05/15/2018 CLINICAL DATA:  Left lower quadrant pain for 1 week. EXAM: TRANSABDOMINAL AND TRANSVAGINAL ULTRASOUND OF PELVIS DOPPLER ULTRASOUND OF OVARIES TECHNIQUE: Both transabdominal and transvaginal ultrasound examinations of the pelvis were performed. Transabdominal technique was performed for global imaging of the pelvis including uterus, ovaries, adnexal regions, and pelvic cul-de-sac. It was necessary to proceed with endovaginal exam following the transabdominal exam to visualize the ovaries and endometrium. Color and duplex Doppler ultrasound was utilized to evaluate blood flow to the ovaries. COMPARISON:  CT scan May 08, 2018. FINDINGS: Uterus Measurements: 10.8 x 7.9 x 9.0 cm. Contains innumerable fibroids. A fibroid at the posterior fundus measures 4.9 x 5.4 x 4.3 cm. A fibroid in the posterior body measures 3.9 x 3.5 x 2.8 cm. A fibroid in the anterior body measures 2.4 x 1.8 x 2 cm. An exophytic fibroid anteriorly measures up to 15 mm. Endometrium Thickness: 8.4 mm. Not well visualized towards the fundus. The endometrium is measured in the lower body. Right ovary Measurements: 2.6 x 2.0 x 1.8 cm. Normal appearance/no adnexal mass. Left ovary Measurements: 2.3 x 2.0 x 1.5 cm. Normal appearance/no adnexal mass. Pulsed Doppler evaluation of both ovaries demonstrates normal low-resistance arterial  and venous waveforms. Other findings No abnormal free fluid. IMPRESSION: 1. Large fibroid uterus. 2. The endometrium is only well seen in the lower uterine body measuring up to 8.4 mm. The technologist worksheet indicates that the patient is perimenopausal with a last menstrual cycle April 17, 2018. An endometrial stripe thickness of 8.9 mm is within normal limits prior to menopause. After menopause, it would be abnormal. Recommend clinical correlation. 3. The ovaries are normal in appearance. Arterial and venous blood flow documented in both ovaries. Electronically Signed   By: Dorise Bullion III M.D   On: 05/15/2018 16:56   US Pelvis Complete  Result Date: 05/15/2018 CLINICAL DATA:  Left lower quadrant pain for 1 week. EXAM: TRANSABDOMINAL AND TRANSVAGINAL ULTRASOUND OF PELVIS DOPPLER ULTRASOUND OF OVARIES TECHNIQUE: Both transabdominal and transvaginal ultrasound examinations of the pelvis were performed. Transabdominal technique was performed for global imaging of the pelvis including uterus, ovaries, adnexal regions, and pelvic cul-de-sac. It was necessary to proceed with endovaginal exam following the transabdominal exam to visualize the ovaries and endometrium. Color and duplex Doppler ultrasound was utilized to evaluate blood flow to the ovaries. COMPARISON:  CT scan May 08, 2018. FINDINGS: Uterus Measurements: 10.8 x 7.9 x 9.0 cm. Contains innumerable fibroids.  A fibroid at the posterior fundus measures 4.9 x 5.4 x 4.3 cm. A fibroid in the posterior body measures 3.9 x 3.5 x 2.8 cm. A fibroid in the anterior body measures 2.4 x 1.8 x 2 cm. An exophytic fibroid anteriorly measures up to 15 mm. Endometrium Thickness: 8.4 mm. Not well visualized towards the fundus. The endometrium is measured in the lower body. Right ovary Measurements: 2.6 x 2.0 x 1.8 cm. Normal appearance/no adnexal mass. Left ovary Measurements: 2.3 x 2.0 x 1.5 cm. Normal appearance/no adnexal mass. Pulsed Doppler evaluation of both  ovaries demonstrates normal low-resistance arterial and venous waveforms. Other findings No abnormal free fluid. IMPRESSION: 1. Large fibroid uterus. 2. The endometrium is only well seen in the lower uterine body measuring up to 8.4 mm. The technologist worksheet indicates that the patient is perimenopausal with a last menstrual cycle April 17, 2018. An endometrial stripe thickness of 8.9 mm is within normal limits prior to menopause. After menopause, it would be abnormal. Recommend clinical correlation. 3. The ovaries are normal in appearance. Arterial and venous blood flow documented in both ovaries. Electronically Signed   By: Dorise Bullion III M.D   On: 05/15/2018 16:56   [place] Art/ven Flow Abd Pelv Doppler  Result Date: 05/15/2018 CLINICAL DATA:  Left lower quadrant pain for 1 week. EXAM: TRANSABDOMINAL AND TRANSVAGINAL ULTRASOUND OF PELVIS DOPPLER ULTRASOUND OF OVARIES TECHNIQUE: Both transabdominal and transvaginal ultrasound examinations of the pelvis were performed. Transabdominal technique was performed for global imaging of the pelvis including uterus, ovaries, adnexal regions, and pelvic cul-de-sac. It was necessary to proceed with endovaginal exam following the transabdominal exam to visualize the ovaries and endometrium. Color and duplex Doppler ultrasound was utilized to evaluate blood flow to the ovaries. COMPARISON:  CT scan May 08, 2018. FINDINGS: Uterus Measurements: 10.8 x 7.9 x 9.0 cm. Contains innumerable fibroids. A fibroid at the posterior fundus measures 4.9 x 5.4 x 4.3 cm. A fibroid in the posterior body measures 3.9 x 3.5 x 2.8 cm. A fibroid in the anterior body measures 2.4 x 1.8 x 2 cm. An exophytic fibroid anteriorly measures up to 15 mm. Endometrium Thickness: 8.4 mm. Not well visualized towards the fundus. The endometrium is measured in the lower body. Right ovary Measurements: 2.6 x 2.0 x 1.8 cm. Normal appearance/no adnexal mass. Left ovary Measurements: 2.3 x 2.0 x 1.5 cm. Normal  appearance/no adnexal mass. Pulsed Doppler evaluation of both ovaries demonstrates normal low-resistance arterial and venous waveforms. Other findings No abnormal free fluid. IMPRESSION: 1. Large fibroid uterus. 2. The endometrium is only well seen in the lower uterine body measuring up to 8.4 mm. The technologist worksheet indicates that the patient is perimenopausal with a last menstrual cycle April 17, 2018. An endometrial stripe thickness of 8.9 mm is within normal limits prior to menopause. After menopause, it would be abnormal. Recommend clinical correlation. 3. The ovaries are normal in appearance. Arterial and venous blood flow documented in both ovaries. Electronically Signed   By: Dorise Bullion III M.D   On: 05/15/2018 16:56    ____________________________________________   PROCEDURES  Procedure(s) performed:   Procedures  None ____________________________________________   INITIAL IMPRESSION / ASSESSMENT AND PLAN / ED COURSE  Pertinent labs & imaging results that were available during my care of the patient were reviewed by me and considered in my medical decision making (see chart for details).  Patient presents to the emergency department for evaluation of continued left buttock/left lower quadrant pain.  No significant  tenderness over the anterior abdomen.  CT renal reviewed from her last ED visit along with lab work.  Plan for attempted pain control here.  Suspect sciatica clinically.  CT from last ED visit had some question for fibroids and recommended transvaginal ultrasound.  Plan to obtain this here to rule out torsion but have low suspicion for that diagnosis.  TVUS pending. Care transferred to Dr. Darl Householder. Plan for discharge on pain medication if TVUS is unremarkable. Patient to f/u with PCP for likely sciatica pain. Will also treat possible UTI. No clinical concern for pyelonephritis.   ____________________________________________  FINAL CLINICAL IMPRESSION(S) / ED  DIAGNOSES  Final diagnoses:  Lower urinary tract infectious disease  Fibroid  Endometrial thickening on ultrasound     MEDICATIONS GIVEN DURING THIS VISIT:  Medications  ketorolac (TORADOL) 30 MG/ML injection 30 mg (30 mg Intramuscular Given 05/15/18 1511)  diazepam (VALIUM) tablet 5 mg (5 mg Oral Given 05/15/18 1512)  cephALEXin (KEFLEX) capsule 500 mg (500 mg Oral Given 05/15/18 1653)     NEW OUTPATIENT MEDICATIONS STARTED DURING THIS VISIT:  Discharge Medication List as of 05/15/2018  5:20 PM    START taking these medications   Details  cephALEXin (KEFLEX) 500 MG capsule Take 1 capsule (500 mg total) by mouth 3 (three) times daily for 7 days., Starting Mon 05/15/2018, Until Mon 05/22/2018, Normal    methocarbamol (ROBAXIN) 500 MG tablet Take 1 tablet (500 mg total) by mouth every 8 (eight) hours as needed for muscle spasms., Starting Mon 05/15/2018, Normal    traMADol (ULTRAM) 50 MG tablet Take 1 tablet (50 mg total) by mouth every 6 (six) hours as needed for severe pain., Starting Mon 05/15/2018, Normal        Note:  This document was prepared using Dragon voice recognition software and may include unintentional dictation errors.  Nanda Quinton, MD Emergency Medicine    Nathaniel Wakeley, [facility], MD 05/15/18 [phone]

## 2018-05-19 DIAGNOSIS — M5136 Other intervertebral disc degeneration, lumbar region: Secondary | ICD-10-CM | POA: Diagnosis not present

## 2018-05-19 DIAGNOSIS — M9904 Segmental and somatic dysfunction of sacral region: Secondary | ICD-10-CM | POA: Diagnosis not present

## 2018-05-19 DIAGNOSIS — M9903 Segmental and somatic dysfunction of lumbar region: Secondary | ICD-10-CM | POA: Diagnosis not present

## 2018-05-19 DIAGNOSIS — M5415 Radiculopathy, thoracolumbar region: Secondary | ICD-10-CM | POA: Diagnosis not present

## 2018-05-21 NOTE — Progress Notes (Deleted)
HPI:   Jordan Ramirez is a 57 y.o. female, who is here today to follow on recent ER visit.   I saw her on 05/05/18. She was in the ER on 05/15/18, transported by EMS because hip pain   She was also in the ER on 05/08/18 because abdominal pain. CT renal,stone protocol: 1.  No acute intra-abdominal process.  No renal or ureteral calculi. 2. Fibroid uterus. Consider non-emergent pelvic ultrasound for better evaluation  Transvaginal US 05/15/18: 1. Large fibroid uterus. 2. The endometrium is only well seen in the lower uterine body measuring up to 8.4 mm. The technologist worksheet indicates that the patient is perimenopausal with a last menstrual cycle April 17, 2018. An endometrial stripe thickness of 8.9 mm is within normal limits prior to menopause. After menopause, it would be abnormal. Recommend clinical correlation. 3. The ovaries are normal in appearance. Arterial and venous blood flow documented in both ovaries.  Lab Results  Component Value Date   WBC 5.9 05/08/2018   HGB 14.3 05/08/2018   HCT 42.2 05/08/2018   MCV 88.5 05/08/2018   PLT 181 05/08/2018   Lab Results  Component Value Date   CREATININE 1.05 (H) 05/08/2018   BUN 18 05/08/2018   NA 140 05/08/2018   K 3.9 05/08/2018   CL 105 05/08/2018   CO2 29 05/08/2018   Lab Results  Component Value Date   ALT 25 05/08/2018   AST 24 05/08/2018   ALKPHOS 79 05/08/2018   BILITOT 0.5 05/08/2018    Overall *** is feeling better, no new symptoms reported.       Review of Systems    Current Outpatient Medications on File Prior to Visit  Medication Sig Dispense Refill  . amLODipine (NORVASC) 5 MG tablet Take 1 tablet (5 mg total) by mouth daily. 90 tablet 2  . cephALEXin (KEFLEX) 500 MG capsule Take 1 capsule (500 mg total) by mouth 3 (three) times daily for 7 days. 40 capsule 0  . Cholecalciferol (VITAMIN D PO) Take 500 Units by mouth daily.    . cyclobenzaprine (FLEXERIL) 10 MG tablet Take 1 tablet (10  mg total) by mouth at bedtime. 15 tablet 0  . furosemide (LASIX) 20 MG tablet Take 1 tablet (20 mg total) by mouth daily as needed. 30 tablet 2  . glucose blood (ONETOUCH VERIO) test strip Use to test blood sugars once daily. 100 each 5  . ibuprofen (ADVIL,MOTRIN) 800 MG tablet Take 1 tablet (800 mg total) by mouth every 8 (eight) hours as needed. 21 tablet 0  . lisinopril (PRINIVIL,ZESTRIL) 20 MG tablet Take 1 tablet (20 mg total) by mouth daily. 90 tablet 2  . meloxicam (MOBIC) 15 MG tablet TAKE 1 TABLET BY MOUTH EVERY DAY 90 tablet 0  . metFORMIN (GLUCOPHAGE-XR) 500 MG 24 hr tablet Take 1 tablet (500 mg total) by mouth 2 (two) times daily with a meal. 180 tablet 2  . methocarbamol (ROBAXIN) 500 MG tablet Take 1 tablet (500 mg total) by mouth every 8 (eight) hours as needed for muscle spasms. 20 tablet 0  . methylPREDNISolone (MEDROL DOSEPAK) 4 MG TBPK tablet Take as directed 21 tablet 0  . ONE TOUCH LANCETS MISC Use to test blood sugars once daily. 100 each 5  . traMADol (ULTRAM) 50 MG tablet Take 1 tablet (50 mg total) by mouth every 6 (six) hours as needed for severe pain. 15 tablet 0   No current facility-administered medications on file prior to visit.  Past Medical History:  Diagnosis Date  . Arthritis   . Chronic low back pain   . Diabetes mellitus without complication ([place])   . Hypertension   . Obesity    No Known Allergies  Social History   Socioeconomic History  . Marital status: Single    Spouse name: Not on file  . Number of children: Not on file  . Years of education: Not on file  . Highest education level: Not on file  Occupational History  . 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 Financial resource strain: Not on file  . Food insecurity:    Worry: Not on file    Inability: Not on file  . Transportation needs:    Medical: Not on file    Non-medical: Not on file  Tobacco  Use  . Smoking status: Never Smoker  . Smokeless tobacco: Never Used  Substance and Sexual Activity  . Alcohol use: No  . Drug use: No  . Sexual activity: Not on file  Lifestyle  . Physical activity:    Days per week: Not on file    Minutes per session: Not on file  . Stress: Not on file  Relationships  . Social connections:    Talks on phone: Not on file    Gets together: Not on file    Attends religious service: Not on file    Active member of club or organization: Not on file    Attends meetings of clubs or organizations: Not on file    Relationship status: Not on file  Other Topics Concern  . Not on file  Social History Narrative  . Not on file    There were no vitals filed for this visit. There is no height or weight on file to calculate BMI.      Physical Exam  ASSESSMENT AND PLAN:  There are no diagnoses linked to this encounter.   No orders of the defined types were placed in this encounter.   No problem-specific Assessment & Plan notes found for this encounter.      Jordan Ramirez G. [place], MD  [facility]. [place] office.

## 2018-05-22 ENCOUNTER — Emergency Department (HOSPITAL_COMMUNITY): Payer: BLUE CROSS/BLUE SHIELD

## 2018-05-22 ENCOUNTER — Inpatient Hospital Stay: Payer: BLUE CROSS/BLUE SHIELD | Admitting: Family Medicine

## 2018-05-22 ENCOUNTER — Encounter (HOSPITAL_COMMUNITY): Payer: Self-pay

## 2018-05-22 ENCOUNTER — Other Ambulatory Visit: Payer: Self-pay

## 2018-05-22 ENCOUNTER — Emergency Department (HOSPITAL_COMMUNITY)
Admission: EM | Admit: 2018-05-22 | Discharge: 2018-05-23 | Disposition: A | Discharge status: Home / Self Care | Payer: BLUE CROSS/BLUE SHIELD | Attending: Emergency Medicine | Admitting: Emergency Medicine

## 2018-05-22 DIAGNOSIS — M5432 Sciatica, left side: Secondary | ICD-10-CM | POA: Diagnosis not present

## 2018-05-22 DIAGNOSIS — E119 Type 2 diabetes mellitus without complications: Secondary | ICD-10-CM | POA: Diagnosis not present

## 2018-05-22 DIAGNOSIS — I10 Essential (primary) hypertension: Secondary | ICD-10-CM | POA: Diagnosis not present

## 2018-05-22 DIAGNOSIS — J45909 Unspecified asthma, uncomplicated: Secondary | ICD-10-CM | POA: Diagnosis not present

## 2018-05-22 DIAGNOSIS — Z79899 Other long term (current) drug therapy: Secondary | ICD-10-CM | POA: Diagnosis not present

## 2018-05-22 DIAGNOSIS — N3000 Acute cystitis without hematuria: Secondary | ICD-10-CM

## 2018-05-22 DIAGNOSIS — M5442 Lumbago with sciatica, left side: Secondary | ICD-10-CM | POA: Diagnosis not present

## 2018-05-22 DIAGNOSIS — R1032 Left lower quadrant pain: Secondary | ICD-10-CM | POA: Insufficient documentation

## 2018-05-22 DIAGNOSIS — Z7984 Long term (current) use of oral hypoglycemic drugs: Secondary | ICD-10-CM | POA: Insufficient documentation

## 2018-05-22 DIAGNOSIS — R102 Pelvic and perineal pain: Secondary | ICD-10-CM | POA: Diagnosis not present

## 2018-05-22 DIAGNOSIS — R52 Pain, unspecified: Secondary | ICD-10-CM | POA: Diagnosis not present

## 2018-05-22 DIAGNOSIS — R1030 Lower abdominal pain, unspecified: Secondary | ICD-10-CM | POA: Diagnosis not present

## 2018-05-22 LAB — CBC
HCT: 47.3 % — ABNORMAL HIGH (ref 36.0–46.0)
HEMOGLOBIN: 16 g/dL — AB (ref 12.0–15.0)
MCH: 30 pg (ref 26.0–34.0)
MCHC: 33.8 g/dL (ref 30.0–36.0)
MCV: 88.7 fL (ref 78.0–100.0)
PLATELETS: 200 10*3/uL (ref 150–400)
RBC: 5.33 MIL/uL — AB (ref 3.87–5.11)
RDW: 13.4 % (ref 11.5–15.5)
WBC: 6.6 10*3/uL (ref 4.0–10.5)

## 2018-05-22 LAB — URINALYSIS, ROUTINE W REFLEX MICROSCOPIC
Bilirubin Urine: NEGATIVE
Glucose, UA: NEGATIVE mg/dL
Ketones, ur: NEGATIVE mg/dL
Nitrite: NEGATIVE
PH: 5 (ref 5.0–8.0)
Protein, ur: 30 mg/dL — AB
SPECIFIC GRAVITY, URINE: 1.024 (ref 1.005–1.030)

## 2018-05-22 LAB — BASIC METABOLIC PANEL
ANION GAP: 11 (ref 5–15)
BUN: 13 mg/dL (ref 6–20)
CHLORIDE: 107 mmol/L (ref 98–111)
CO2: 25 mmol/L (ref 22–32)
CREATININE: 1 mg/dL (ref 0.44–1.00)
Calcium: 9.2 mg/dL (ref 8.9–10.3)
GFR calc non Af Amer: >60 mL/min (ref >60)
Glucose, Bld: 127 mg/dL — ABNORMAL HIGH (ref 70–99)
POTASSIUM: 3.7 mmol/L (ref 3.5–5.1)
SODIUM: 143 mmol/L (ref 135–145)

## 2018-05-22 LAB — SEDIMENTATION RATE: Sed Rate: 9 mm/hr (ref 0–22)

## 2018-05-22 MED ORDER — IOPAMIDOL (ISOVUE-300) INJECTION 61%
100.0000 mL | Freq: Once | INTRAVENOUS | Status: AC | PRN
  Administered 2018-05-22: 100 mL via INTRAVENOUS

## 2018-05-22 MED ORDER — IOPAMIDOL (ISOVUE-300) INJECTION 61%
INTRAVENOUS | Status: AC
  Filled 2018-05-22: qty 100

## 2018-05-22 MED ORDER — SODIUM CHLORIDE 0.9 % IV SOLN
Freq: Once | INTRAVENOUS | Status: AC
  Administered 2018-05-22: 20:00:00 via INTRAVENOUS

## 2018-05-22 MED ORDER — ORPHENADRINE CITRATE ER 100 MG PO TB12
100.0000 mg | ORAL_TABLET | Freq: Two times a day (BID) | ORAL | 0 refills | Status: DC
Start: 2018-05-22 — End: 2020-02-13

## 2018-05-22 MED ORDER — PREDNISONE 20 MG PO TABS: ORAL_TABLET | ORAL | 0 refills | Status: DC

## 2018-05-22 MED ORDER — CEPHALEXIN 500 MG PO CAPS: 1000.0000 mg | ORAL_CAPSULE | Freq: Two times a day (BID) | ORAL | 0 refills | Status: DC

## 2018-05-22 MED ORDER — OXYCODONE-ACETAMINOPHEN 5-325 MG PO TABS: 2.0000 | ORAL_TABLET | Freq: Once | ORAL | Status: DC

## 2018-05-22 MED ORDER — OXYCODONE-ACETAMINOPHEN 5-325 MG PO TABS: 1.0000 | ORAL_TABLET | Freq: Four times a day (QID) | ORAL | 0 refills | Status: DC | PRN

## 2018-05-22 MED ORDER — HYDROMORPHONE HCL 1 MG/ML IJ SOLN
1.0000 mg | Freq: Once | INTRAMUSCULAR | Status: AC
  Administered 2018-05-22: 1 mg via INTRAVENOUS
  Filled 2018-05-22: qty 1

## 2018-05-22 MED ORDER — CEPHALEXIN 500 MG PO CAPS
1000.0000 mg | ORAL_CAPSULE | Freq: Once | ORAL | Status: AC
  Administered 2018-05-22: 1000 mg via ORAL
  Filled 2018-05-22: qty 2

## 2018-05-22 MED ORDER — OXYCODONE-ACETAMINOPHEN 5-325 MG PO TABS
2.0000 | ORAL_TABLET | Freq: Once | ORAL | Status: AC
Start: 2018-05-22 — End: 2018-05-22
  Administered 2018-05-22: 2 via ORAL
  Filled 2018-05-22: qty 2

## 2018-05-22 NOTE — ED Triage Notes (Addendum)
Groin pain x2 weeks. Seen for same last week. Per pt, muscle relaxer is not working. VSS. Pt states that it runs from her groin back to her left buttock. Pt also states that when she was d/c last week, her foot was run into the wall.

## 2018-05-22 NOTE — ED Provider Notes (Signed)
[facility] DEPT Provider Note   CSN: [id number] Arrival date & time: 05/22/18  1316     History   Chief Complaint Chief Complaint  Patient presents with  . Groin Pain  . Ankle Pain    HPI Jordan Ramirez is a 57 y.o. female.  HPI Patient is continued to have pain that she qualifies as starting in her groin and sometimes radiating around more towards her back and flank.  She reports that she gets severe episodes of pain and sometimes she perceives pain that radiates all the way down her leg to her foot.  Most frequently though, the pain seems to originate right in the groin.  She reports that she is able to walk but after standing for a short period the pain becomes quite intense.  She has been diagnosed with fibroid uterus with ultrasound on 7\29 and a CT renal stone scan done 7\22.  Fibroid uterus identified but no other serious acute problems identified.  Patient reports that she does does not think this pain is coming from her female organs.  When it first started it was almost 2 weeks ago she was sitting at her desk when she got a sudden severe pain in her groin and lower abdomen.  Since then she has had steroid therapy Past Medical History:  Diagnosis Date  . Arthritis   . Chronic low back pain   . Diabetes mellitus without complication ([place])   . Hypertension   . Obesity     Patient Active Problem List   Diagnosis Date Noted  . Ganglion cyst of dorsum of right wrist 01/16/2018  . Bilateral knee pain 04/17/2012  . Pain of left heel 04/17/2012  . Diabetes mellitus type 2 with neurological manifestations ([place]) 08/28/2009  . THUMB PAIN, LEFT 08/27/2009  . Hyperlipemia 08/20/2009  . Class 3 severe obesity due to excess calories with serious comorbidity and body mass index (BMI) of 45.0 to 49.9 in adult ([place]) 08/20/2009  . ANEMIA-NOS 08/20/2009  . Essential hypertension 08/20/2009  . ALLERGIC RHINITIS 08/20/2009  . ASTHMA 08/20/2009  .  MENORRHAGIA, PERIMENOPAUSAL 08/20/2009  . DEGENERATIVE JOINT DISEASE, KNEE 08/20/2009  . TENDINITIS, LEFT THUMB 08/20/2009    Past Surgical History:  Procedure Laterality Date  . PILONIDAL CYST EXCISION    . WISDOM TOOTH EXTRACTION       OB History   None      Home Medications    Prior to Admission medications   Medication Sig Start Date End Date Taking? Authorizing Provider  amLODipine (NORVASC) 5 MG tablet Take 1 tablet (5 mg total) by mouth daily. 07/29/17  Yes Martinique, Betty G, MD  cephALEXin (KEFLEX) 500 MG capsule Take 1 capsule (500 mg total) by mouth 3 (three) times daily for 7 days. 05/15/18 05/22/18 Yes Long, Wonda Olds, MD  Cholecalciferol (VITAMIN D PO) Take 500 Units by mouth daily.   Yes [provider]  cyclobenzaprine (FLEXERIL) 10 MG tablet Take 1 tablet (10 mg total) by mouth at bedtime. 05/10/18  Yes Nafziger, Tommi Rumps, NP  furosemide (LASIX) 20 MG tablet Take 1 tablet (20 mg total) by mouth daily as needed. 05/05/18  Yes Martinique, Betty G, MD  ibuprofen (ADVIL,MOTRIN) 800 MG tablet Take 1 tablet (800 mg total) by mouth every 8 (eight) hours as needed. 05/08/18  Yes Long, Wonda Olds, MD  lisinopril (PRINIVIL,ZESTRIL) 20 MG tablet Take 1 tablet (20 mg total) by mouth daily. 07/29/17  Yes Martinique, Betty G, MD  meloxicam [facility]) 15  MG tablet TAKE 1 TABLET BY MOUTH EVERY DAY 05/15/18  Yes Martinique, Betty G, MD  metFORMIN (GLUCOPHAGE-XR) 500 MG 24 hr tablet Take 1 tablet (500 mg total) by mouth 2 (two) times daily with a meal. 07/29/17  Yes Martinique, Betty G, MD  traMADol (ULTRAM) 50 MG tablet Take 1 tablet (50 mg total) by mouth every 6 (six) hours as needed for severe pain. 05/15/18  Yes Long, Wonda Olds, MD  cephALEXin (KEFLEX) 500 MG capsule Take 2 capsules (1,000 mg total) by mouth 2 (two) times daily. 05/22/18   Charlesetta Shanks, MD  glucose blood (ONETOUCH VERIO) test strip Use to test blood sugars once daily. 06/13/17   Martinique, Betty G, MD  methocarbamol (ROBAXIN) 500 MG tablet Take  1 tablet (500 mg total) by mouth every 8 (eight) hours as needed for muscle spasms. Patient not taking: Reported on 05/22/2018 05/15/18   Margette Fast, MD  methylPREDNISolone (MEDROL DOSEPAK) 4 MG TBPK tablet Take as directed Patient not taking: Reported on 05/22/2018 05/10/18   Dorothyann Peng, NP  ONE TOUCH LANCETS MISC Use to test blood sugars once daily. 06/13/17   Martinique, Betty G, MD  orphenadrine (NORFLEX) 100 MG tablet Take 1 tablet (100 mg total) by mouth 2 (two) times daily. 05/22/18   Charlesetta Shanks, MD  oxyCODONE-acetaminophen (PERCOCET) 5-325 MG tablet Take 1-2 tablets by mouth every 6 (six) hours as needed. 05/22/18   Charlesetta Shanks, MD  predniSONE (DELTASONE) 20 MG tablet 3 tabs po daily x 3 days, then 2 tabs x 3 days, then 1.5 tabs x 3 days, then 1 tab x 3 days, then 0.5 tabs x 3 days 05/22/18   Charlesetta Shanks, MD    Family History Family History  Problem Relation Age of Onset  . Coronary artery disease Mother   . Diabetes type II Mother   . Depression Mother   . Hypertension Mother   . Kidney failure Father     Social History Social History   Tobacco Use  . Smoking status: Never Smoker  . Smokeless tobacco: Never Used  Substance Use Topics  . Alcohol use: No  . Drug use: No     Allergies   Patient has no known allergies.   Review of Systems Review of Systems 10 Systems reviewed and are negative for acute change except as noted in the HPI.   Physical Exam Updated Vital Signs BP (!) 174/102   Pulse 87   Temp 98.3 F (36.8 C) (Oral)   Resp 18   Ht 5\' 7"  (1.702 m)   Wt 113.4 kg (250 lb)   SpO2 98%   BMI 39.16 kg/m   Physical Exam  Constitutional: She is oriented to person, place, and time.  Patient is alert and nontoxic.  Mental status is clear.  No respiratory distress.  Obesity.  HENT:  Head: Normocephalic and atraumatic.  Eyes: EOM are normal.  Neck: Neck supple.  Cardiovascular: Normal rate, regular rhythm, normal heart sounds and intact distal  pulses.  Pulmonary/Chest: Effort normal and breath sounds normal.  Abdominal: Soft. She exhibits no distension. There is tenderness. There is no guarding.  Mild to moderate lower left quadrant discomfort to palpation.  No guarding or mass.  Patient of the groin does not reveal any mass or fullness with in the groin or the vascular bundle.  No erythema of the upper leg.  Musculoskeletal: Normal range of motion.  Patient has no peripheral edema.  The calves are soft and nontender.  There  is no swelling of the feet.  Soft tissues of the lower extremities are symmetric.  Patient endorses some degree of discomfort to the medial thigh on the left.  Range of motion is intact.  Patient is able to do straight leg raise bilaterally without severe pain.  Left becomes uncomfortable at about 45 degrees.  Neurological: She is alert and oriented to person, place, and time. No cranial nerve deficit or sensory deficit. She exhibits normal muscle tone. Coordination normal.  Patient has intact motor strength for flexion extension of the lower legs.  No weakness or numbness of the left lower extremity, patient is able to extend against resistance and elevate and hold her leg against gravity.  Skin: Skin is warm and dry.  Psychiatric: She has a normal mood and affect.     ED Treatments / Results  Labs (all labs ordered are listed, but only abnormal results are displayed) Labs Reviewed  URINALYSIS, ROUTINE W REFLEX MICROSCOPIC - Abnormal; Notable for the following components:      Result Value   APPearance HAZY (*)    Hgb urine dipstick SMALL (*)    Protein, ur 30 (*)    Leukocytes, UA SMALL (*)    Bacteria, UA MANY (*)    All other components within normal limits  BASIC METABOLIC PANEL - Abnormal; Notable for the following components:   Glucose, Bld 127 (*)    All other components within normal limits  CBC - Abnormal; Notable for the following components:   RBC 5.33 (*)    Hemoglobin 16.0 (*)    HCT 47.3  (*)    All other components within normal limits  URINE CULTURE  SEDIMENTATION RATE    EKG None  Radiology Ct Abdomen Pelvis W Contrast  Result Date: 05/22/2018 CLINICAL DATA:  57 year old female with acute pelvic pain for 2 weeks. EXAM: CT ABDOMEN AND PELVIS WITH CONTRAST TECHNIQUE: Multidetector CT imaging of the abdomen and pelvis was performed using the standard protocol following bolus administration of intravenous contrast. CONTRAST:  183mL ISOVUE-300 IOPAMIDOL (ISOVUE-300) INJECTION 61% COMPARISON:  05/15/2018 ultrasound and 05/08/2018 CT FINDINGS: Lower chest: No acute abnormality. Hepatobiliary: The liver and gallbladder are unremarkable. No biliary dilatation. Pancreas: Unremarkable Spleen: Unremarkable Adrenals/Urinary Tract: The kidneys, adrenal glands and bladder are unremarkable. Stomach/Bowel: Stomach is within normal limits. Appendix appears normal. No evidence of bowel wall thickening, distention, or inflammatory changes. Vascular/Lymphatic: No significant vascular findings are present. No enlarged abdominal or pelvic lymph nodes. Reproductive: An enlarged fibroid uterus is again identified. No adnexal masses. Other: No ascites, focal collection, pneumoperitoneum or abdominal wall hernia. Musculoskeletal: No acute or suspicious bony abnormalities. IMPRESSION: 1. No evidence of acute abnormality. 2. Enlarged fibroid uterus again identified. Electronically Signed   By: Margarette Canada M.D.   On: 05/22/2018 21:59    Procedures Procedures (including critical care time)  Medications Ordered in ED Medications  iopamidol (ISOVUE-300) 61 % injection (has no administration in time range)  cephALEXin (KEFLEX) capsule 1,000 mg (has no administration in time range)  oxyCODONE-acetaminophen (PERCOCET/ROXICET) 5-325 MG per tablet 2 tablet (has no administration in time range)  0.9 %  sodium chloride infusion ( Intravenous New Bag/Given 05/22/18 2014)  HYDROmorphone (DILAUDID) injection 1 mg (1 mg  Intravenous Given 05/22/18 2016)  iopamidol (ISOVUE-300) 61 % injection 100 mL (100 mLs Intravenous Contrast Given 05/22/18 2132)     Initial Impression / Assessment and Plan / ED Course  I have reviewed the triage vital signs and the nursing notes.  Pertinent labs & imaging results that were available during my care of the patient were reviewed by me and considered in my medical decision making (see chart for details).     Final Clinical Impressions(s) / ED Diagnoses   Final diagnoses:  Acute cystitis without hematuria  Left inguinal pain  Sciatica of left side   Patient has had several weeks of pain after onset that she predominately noted to be in the groin with radiation from buttocks and into the leg as well.  Has had treatment.  She does admit that when she was taking the Medrol Dosepak she was better pain controlled until she got to the last 1 or 2 days of the pack.  Pain started to come back again.  She does not have weakness or numbness but reports it is hard for her to walk some because of how painful it can become.  Pain is also exacerbated straight sitting up position patient is vascular exam of the leg is normal with strong dorsalis pedis pulses.  Calves are soft and nontender.  Due to element of groin pain and urinalysis trace positive, CT scan is been repeated.  Negative for acute findings that would associate with the patient's symptoms.  Plan at this time is to treat for urinary tract infection.  Will treat for what persistent being consistent with sciatica without neurologic compromise.  Patient is to follow-up with her PCP this week to reassess for response to treatment.  She is counseled MRI may be needed if symptoms are not improving with another course of prednisone and pain control.  Is counseled she must return immediately for weakness numbness or extremity dysfunction. ED Discharge Orders        Ordered    cephALEXin (KEFLEX) 500 MG capsule  2 times daily     05/22/18 2306     predniSONE (DELTASONE) 20 MG tablet     05/22/18 2306    oxyCODONE-acetaminophen (PERCOCET) 5-325 MG tablet  Every 6 hours PRN     05/22/18 2306    orphenadrine (NORFLEX) 100 MG tablet  2 times daily     05/22/18 2306       Charlesetta Shanks, MD 05/22/18 2316

## 2018-05-22 NOTE — ED Notes (Signed)
Patient transported to CT 

## 2018-05-22 NOTE — Discharge Instructions (Addendum)
1.  Take Keflex as prescribed.  A urine culture was ordered.  Have your doctor follow-up on the results. 2.  You should see your doctor within the next 2 days for recheck.  If you are having persistent symptoms with pain radiating to the foot and especially if you develop any weakness or numbness, you will need an MRI. 3.  Return to the emergency department if you develop weakness in the leg numbness, loss of control of your bladder or bowels.

## 2018-05-24 ENCOUNTER — Encounter: Payer: Self-pay | Admitting: Family Medicine

## 2018-05-24 ENCOUNTER — Ambulatory Visit: Payer: BLUE CROSS/BLUE SHIELD | Admitting: Family Medicine

## 2018-05-24 ENCOUNTER — Encounter: Payer: Self-pay | Admitting: *Deleted

## 2018-05-24 VITALS — BP 140/90 | HR 100 | Temp 98.6°F | Resp 16 | Ht 67.0 in

## 2018-05-24 DIAGNOSIS — M5410 Radiculopathy, site unspecified: Secondary | ICD-10-CM

## 2018-05-24 DIAGNOSIS — I10 Essential (primary) hypertension: Secondary | ICD-10-CM | POA: Diagnosis not present

## 2018-05-24 LAB — URINE CULTURE

## 2018-05-24 MED ORDER — TRAMADOL HCL 50 MG PO TABS: 50.0000 mg | ORAL_TABLET | Freq: Three times a day (TID) | ORAL | 0 refills | Status: AC | PRN

## 2018-05-24 NOTE — Progress Notes (Signed)
HPI:  Chief Complaint  Patient presents with  . Sciatica    left sided    [name] is a 57 y.o. female, who is here today to follow on recent ER visit.   She is c/o LLQ pain that radiates to buttock, anterior aspect of thigh,and down to ankle. Great toe numbness.  "Horrible pain", now 8/10,exacerbated by walking and alleviated by rest (no pain).  No Hx of trauma but a weeks before pain started she took luggage out of her car,she does not recall pulling a muscle.  She is on her second steroid treatment. No saddle anesthesia or urine/bowel incontinence.  She had some constipation,last bowel movement today. No blood in stool.   ER 05/08/18 c/o LLQ pain.  She was seen here in the office for acute visit on 05/10/2018, she was complaining about left lower extremity pain. She was started on Flexeril and methylprednisone.  CT renal stone study on 05/08/18: 1.  No acute intra-abdominal process.  No renal or ureteral calculi. 2. Fibroid uterus. Consider non-emergent pelvic ultrasound for better evaluation   Evaluated in the ER on 05/22/18, evaluated for acute cystitis and left inguinal pain.  Pelvic and transvaginal US on 05/15/2018: 1. Large fibroid uterus. 2. The endometrium is only well seen in the lower uterine body measuring up to 8.4 mm. The technologist worksheet indicates that the patient is perimenopausal with a last menstrual cycle April 17, 2018. An endometrial stripe thickness of 8.9 mm is within normal limits prior to menopause. After menopause, it would be abnormal.Recommend clinical correlation. 3. The ovaries are normal in appearance. Arterial and venous blood flow documented in both ovaries.    CT of abdomen and pelvis on 05/22/2018: 1. No evidence of acute abnormality. 2. Enlarged fibroid uterus again identified.   05/22/18: UCx grew <10,000 CFU  She is taking Ibuprofen,took it last about 2 hours ago. No urinary symptoms,vaginal bleeding,or  discharge.   She has appt with gyn.  Hx of HTN,BP is mildly elevated today. She is on Amlodipine 5 mg daily.    Review of Systems  Constitutional: Positive for activity change. Negative for appetite change, fatigue, fever and unexpected weight change.  HENT: Negative for mouth sores, sore throat and trouble swallowing.   Respiratory: Negative for shortness of breath and wheezing.   Cardiovascular: Negative for leg swelling.  Gastrointestinal: Negative for abdominal pain, blood in stool, nausea and vomiting.       Negative for changes in bowel habits.  Genitourinary: Negative for decreased urine volume, dysuria, hematuria, vaginal bleeding and vaginal discharge.  Musculoskeletal: Positive for back pain. Negative for arthralgias, joint swelling and neck pain.  Skin: Negative for rash.  Neurological: Negative for weakness and headaches.  Psychiatric/Behavioral: Negative for confusion. The patient is nervous/anxious.       Current Outpatient Medications on File Prior to Visit  Medication Sig Dispense Refill  . amLODipine (NORVASC) 5 MG tablet Take 1 tablet (5 mg total) by mouth daily. 90 tablet 2  . cephALEXin (KEFLEX) 500 MG capsule Take 2 capsules (1,000 mg total) by mouth 2 (two) times daily. 28 capsule 0  . Cholecalciferol (VITAMIN D PO) Take 500 Units by mouth daily.    . cyclobenzaprine (FLEXERIL) 10 MG tablet Take 1 tablet (10 mg total) by mouth at bedtime. 15 tablet 0  . furosemide (LASIX) 20 MG tablet Take 1 tablet (20 mg total) by mouth daily as needed. 30 tablet 2  . glucose blood (ONETOUCH VERIO) test  strip Use to test blood sugars once daily. 100 each 5  . lisinopril (PRINIVIL,ZESTRIL) 20 MG tablet Take 1 tablet (20 mg total) by mouth daily. 90 tablet 2  . meloxicam (MOBIC) 15 MG tablet TAKE 1 TABLET BY MOUTH EVERY DAY 90 tablet 0  . metFORMIN (GLUCOPHAGE-XR) 500 MG 24 hr tablet Take 1 tablet (500 mg total) by mouth 2 (two) times daily with a meal. 180 tablet 2  .  methocarbamol (ROBAXIN) 500 MG tablet Take 1 tablet (500 mg total) by mouth every 8 (eight) hours as needed for muscle spasms. 20 tablet 0  . ONE TOUCH LANCETS MISC Use to test blood sugars once daily. 100 each 5  . orphenadrine (NORFLEX) 100 MG tablet Take 1 tablet (100 mg total) by mouth 2 (two) times daily. 30 tablet 0  . predniSONE (DELTASONE) 20 MG tablet 3 tabs po daily x 3 days, then 2 tabs x 3 days, then 1.5 tabs x 3 days, then 1 tab x 3 days, then 0.5 tabs x 3 days 27 tablet 0   No current facility-administered medications on file prior to visit.      Past Medical History:  Diagnosis Date  . Arthritis   . Chronic low back pain   . Diabetes mellitus without complication ([place])   . Hypertension   . Obesity    No Known Allergies  Social History   Socioeconomic History  . Marital status: Single    Spouse name: Not on file  . Number of children: Not on file  . Years of education: Not on file  . Highest education level: Not on file  Occupational History  . 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 Financial resource strain: Not on file  . Food insecurity:    Worry: Not on file    Inability: Not on file  . Transportation needs:    Medical: Not on file    Non-medical: Not on file  Tobacco Use  . Smoking status: Never Smoker  . Smokeless tobacco: Never Used  Substance and Sexual Activity  . Alcohol use: No  . Drug use: No  . Sexual activity: Not on file  Lifestyle  . Physical activity:    Days per week: Not on file    Minutes per session: Not on file  . Stress: Not on file  Relationships  . Social connections:    Talks on phone: Not on file    Gets together: Not on file    Attends religious service: Not on file    Active member of club or organization: Not on file    Attends meetings of clubs or organizations: Not on file    Relationship status: Not on file  Other Topics Concern  .  Not on file  Social History Narrative  . Not on file    Vitals:   05/24/18 1442  BP: 140/90  Pulse: 100  Resp: 16  Temp: 98.6 F (37 C)  SpO2: 97%   Body mass index is 39.16 kg/m.    Physical Exam  Nursing note and vitals reviewed. Constitutional: She is oriented to person, place, and time. She appears well-developed. She does not appear ill. She appears distressed.  HENT:  Head: Normocephalic and atraumatic.  Eyes: Conjunctivae are normal.  Cardiovascular: Normal rate and regular rhythm.  Pulses:      Dorsalis pedis pulses are 2+ on the right side, and 2+ on the  left side.  Respiratory: Effort normal and breath sounds normal. No respiratory distress.  GI: Soft. She exhibits no mass. There is no hepatomegaly. There is no tenderness.  Musculoskeletal: She exhibits no edema.       Lumbar back: She exhibits no tenderness and no bony tenderness.  Pain elicited with movement on exam table during examination. No local edema or erythema appreciated, no suspicious lesions.  Lymphadenopathy:    She has no cervical adenopathy.  Neurological: She is alert and oriented to person, place, and time. She has normal strength. Gait (antalgic gait.) abnormal.  Reflex Scores:      Patellar reflexes are 2+ on the right side and 2+ on the left side.      Achilles reflexes are 2+ on the right side and 2+ on the left side. SLR negative bilateral.  Skin: Skin is warm. No rash noted. No erythema.  Psychiatric: She has a normal mood and affect.  Well groomed, good eye contact.    ASSESSMENT AND PLAN:   [name] was seen today for sciatica.  Orders Placed This Encounter  Procedures  . MR Lumbar Spine Wo Contrast  . Ambulatory referral to Orthopedic Surgery     Radicular pain of left lower extremity  Side effects of tramadol discussed. Relative rest. She would like to see Dr Nelva Bush. Instructed about warning signs.  -     traMADol (ULTRAM) 50 MG tablet; Take 1 tablet (50 mg total)  by mouth every 8 (eight) hours as needed for up to 10 days for severe pain. -     MR Lumbar Spine Wo Contrast; Future -     Ambulatory referral to Orthopedic Surgery  Essential hypertension  Some side effects of NSAID's discussed. No changes in current management. Continue monitoring BP at home. Keep next F/U appt.       Gracieann Stannard G. Martinique, MD  [facility]. [place] office.

## 2018-05-24 NOTE — Patient Instructions (Signed)
A few things to remember from today's visit:   Radicular pain of left lower extremity - Plan: traMADol (ULTRAM) 50 MG tablet, MR Lumbar Spine Wo Contrast, Ambulatory referral to Orthopedic Surgery  Continue tramadol 50 mg 3 times per day as needed. Relative rest. Appointment with orthopedist will be arranged as well as for lumbar MRI.  Please be sure medication list is accurate. If a new problem present, please set up appointment sooner than planned today.

## 2018-05-25 ENCOUNTER — Telehealth: Payer: Self-pay | Admitting: Family Medicine

## 2018-05-25 NOTE — Telephone Encounter (Signed)
Copied from [place] [id number]. Topic: Inquiry >> May 25, 2018  2:15 PM Pricilla Handler wrote: Reason for CRM: Patient called requesting to speak with Dr. Doug Sou assistant concerning a return to work letter. The Letter has the wrong date, and the patient needs it changed to 06/05/2018.        Thank You!!!

## 2018-05-26 NOTE — Telephone Encounter (Signed)
Patient is checking status, needs corrected letter today.

## 2018-05-26 NOTE — Telephone Encounter (Signed)
Letter corrected on 05/24/18. Patient was unable to see it on my chart. Patient was able to see letter and print it on 05/26/18 while on the phone with me.

## 2018-05-27 ENCOUNTER — Encounter: Payer: Self-pay | Admitting: Family Medicine

## 2018-05-29 NOTE — Telephone Encounter (Signed)
Pt is calling and her employer will fax the letter for dr Martinique to sign the letter. They needs dr Martinique signature. Please refax back to her employer asap

## 2018-05-30 NOTE — Telephone Encounter (Signed)
Letter placed on doctors desk for signature. Will fax soon as she completed.

## 2018-05-31 NOTE — Telephone Encounter (Signed)
Letter faxed to employer with confirmation.

## 2018-06-01 DIAGNOSIS — M5416 Radiculopathy, lumbar region: Secondary | ICD-10-CM | POA: Diagnosis not present

## 2018-06-02 ENCOUNTER — Ambulatory Visit: Payer: BLUE CROSS/BLUE SHIELD | Admitting: Family Medicine

## 2018-06-02 DIAGNOSIS — M25552 Pain in left hip: Secondary | ICD-10-CM | POA: Diagnosis not present

## 2018-06-02 DIAGNOSIS — M47897 Other spondylosis, lumbosacral region: Secondary | ICD-10-CM | POA: Diagnosis not present

## 2018-06-02 DIAGNOSIS — M47896 Other spondylosis, lumbar region: Secondary | ICD-10-CM | POA: Diagnosis not present

## 2018-06-02 DIAGNOSIS — R1032 Left lower quadrant pain: Secondary | ICD-10-CM | POA: Diagnosis not present

## 2018-06-02 DIAGNOSIS — M2548 Effusion, other site: Secondary | ICD-10-CM | POA: Diagnosis not present

## 2018-06-02 DIAGNOSIS — M5126 Other intervertebral disc displacement, lumbar region: Secondary | ICD-10-CM | POA: Diagnosis not present

## 2018-06-02 DIAGNOSIS — R52 Pain, unspecified: Secondary | ICD-10-CM | POA: Diagnosis not present

## 2018-06-02 DIAGNOSIS — Z79899 Other long term (current) drug therapy: Secondary | ICD-10-CM | POA: Diagnosis not present

## 2018-06-02 DIAGNOSIS — R1030 Lower abdominal pain, unspecified: Secondary | ICD-10-CM | POA: Diagnosis not present

## 2018-06-02 DIAGNOSIS — M5416 Radiculopathy, lumbar region: Secondary | ICD-10-CM | POA: Diagnosis not present

## 2018-06-02 DIAGNOSIS — R9389 Abnormal findings on diagnostic imaging of other specified body structures: Secondary | ICD-10-CM | POA: Diagnosis not present

## 2018-06-02 DIAGNOSIS — I10 Essential (primary) hypertension: Secondary | ICD-10-CM | POA: Diagnosis not present

## 2018-06-02 DIAGNOSIS — M549 Dorsalgia, unspecified: Secondary | ICD-10-CM | POA: Diagnosis not present

## 2018-06-02 DIAGNOSIS — Z791 Long term (current) use of non-steroidal anti-inflammatories (NSAID): Secondary | ICD-10-CM | POA: Diagnosis not present

## 2018-06-02 DIAGNOSIS — D259 Leiomyoma of uterus, unspecified: Secondary | ICD-10-CM | POA: Diagnosis not present

## 2018-06-12 DIAGNOSIS — M5136 Other intervertebral disc degeneration, lumbar region: Secondary | ICD-10-CM | POA: Diagnosis not present

## 2018-06-12 DIAGNOSIS — M9903 Segmental and somatic dysfunction of lumbar region: Secondary | ICD-10-CM | POA: Diagnosis not present

## 2018-06-12 DIAGNOSIS — M5415 Radiculopathy, thoracolumbar region: Secondary | ICD-10-CM | POA: Diagnosis not present

## 2018-06-12 DIAGNOSIS — M9904 Segmental and somatic dysfunction of sacral region: Secondary | ICD-10-CM | POA: Diagnosis not present

## 2018-06-14 ENCOUNTER — Other Ambulatory Visit: Payer: Self-pay | Admitting: Family Medicine

## 2018-06-14 ENCOUNTER — Other Ambulatory Visit: Payer: BLUE CROSS/BLUE SHIELD

## 2018-06-14 DIAGNOSIS — M5415 Radiculopathy, thoracolumbar region: Secondary | ICD-10-CM | POA: Diagnosis not present

## 2018-06-14 DIAGNOSIS — I10 Essential (primary) hypertension: Secondary | ICD-10-CM

## 2018-06-14 DIAGNOSIS — M9903 Segmental and somatic dysfunction of lumbar region: Secondary | ICD-10-CM | POA: Diagnosis not present

## 2018-06-14 DIAGNOSIS — M9904 Segmental and somatic dysfunction of sacral region: Secondary | ICD-10-CM | POA: Diagnosis not present

## 2018-06-16 DIAGNOSIS — M5137 Other intervertebral disc degeneration, lumbosacral region: Secondary | ICD-10-CM | POA: Diagnosis not present

## 2018-06-21 DIAGNOSIS — M5136 Other intervertebral disc degeneration, lumbar region: Secondary | ICD-10-CM | POA: Diagnosis not present

## 2018-06-21 DIAGNOSIS — M5415 Radiculopathy, thoracolumbar region: Secondary | ICD-10-CM | POA: Diagnosis not present

## 2018-06-21 DIAGNOSIS — M9904 Segmental and somatic dysfunction of sacral region: Secondary | ICD-10-CM | POA: Diagnosis not present

## 2018-06-21 DIAGNOSIS — M9903 Segmental and somatic dysfunction of lumbar region: Secondary | ICD-10-CM | POA: Diagnosis not present

## 2018-06-26 DIAGNOSIS — M5136 Other intervertebral disc degeneration, lumbar region: Secondary | ICD-10-CM | POA: Diagnosis not present

## 2018-06-26 DIAGNOSIS — M5415 Radiculopathy, thoracolumbar region: Secondary | ICD-10-CM | POA: Diagnosis not present

## 2018-06-26 DIAGNOSIS — M9903 Segmental and somatic dysfunction of lumbar region: Secondary | ICD-10-CM | POA: Diagnosis not present

## 2018-06-30 DIAGNOSIS — M5126 Other intervertebral disc displacement, lumbar region: Secondary | ICD-10-CM | POA: Diagnosis not present

## 2018-07-10 DIAGNOSIS — M5126 Other intervertebral disc displacement, lumbar region: Secondary | ICD-10-CM | POA: Diagnosis not present

## 2018-07-11 ENCOUNTER — Ambulatory Visit: Payer: BLUE CROSS/BLUE SHIELD | Admitting: Family Medicine

## 2018-07-11 ENCOUNTER — Ambulatory Visit (INDEPENDENT_AMBULATORY_CARE_PROVIDER_SITE_OTHER): Payer: BLUE CROSS/BLUE SHIELD

## 2018-07-11 ENCOUNTER — Encounter: Payer: Self-pay | Admitting: Family Medicine

## 2018-07-11 VITALS — BP 140/88 | HR 100 | Temp 98.3°F | Resp 12 | Ht 67.0 in | Wt 267.5 lb

## 2018-07-11 DIAGNOSIS — M792 Neuralgia and neuritis, unspecified: Secondary | ICD-10-CM | POA: Diagnosis not present

## 2018-07-11 DIAGNOSIS — I10 Essential (primary) hypertension: Secondary | ICD-10-CM | POA: Diagnosis not present

## 2018-07-11 DIAGNOSIS — M79672 Pain in left foot: Secondary | ICD-10-CM | POA: Diagnosis not present

## 2018-07-11 MED ORDER — GABAPENTIN 100 MG PO CAPS: 100.0000 mg | ORAL_CAPSULE | Freq: Every day | ORAL | 0 refills | Status: DC

## 2018-07-11 NOTE — Progress Notes (Signed)
ACUTE VISIT   HPI:  Chief Complaint  Patient presents with  . Foot Pain    left foot, going on for some time now    Ms.Shakelia Scrivner is a 57 y.o. female with history of chronic back pain and OA who is here today complaining of left foot pain. She states that pain started after her foot hit base of exam table when she was in the ER (06/02/18)when her wheel chair was push forward. States that she did not mention this during following visits because initially she thought it was associated to her lower back pain.  Shooting like pain interfering with sleep. Constant pain,5-6/10. Foot is sensitive to touch. Tingling/numb sensation of great left toe.  She has not identified exacerbating or alleviating factors.  No associated weakness.  Hx of lower back pain radiated to LLE. She received epidural injection 06/30/18,which helped with back and foot pain.  She discussed foot pain with ortho,Dr Nelva Bush, thought to be related to LLE radicular pain.  She has not taken OTC analgesics.  Negative for saddle anesthesia or changes in urine/bowel continence. No urinary symptoms.  She has f/u appt with Dr Nelva Bush mid 07/2018.   HTN: BP elevated today. She has not taken her antihypertensive medication today. She is on Lisinopril 20 mg and Amlodipine 5 mg daily.  Denies severe/frequent headache, visual changes, chest pain, dyspnea, palpitation, focal weakness, or edema.  She does not check BP at home.  DM II, BS's "good." Lab Results  Component Value Date   HGBA1C 6.9 (H) 01/16/2018    Denies abdominal pain, nausea,vomiting, polydipsia,polyuria, or polyphagia.     Review of Systems  Constitutional: Positive for fatigue. Negative for chills and fever.  Respiratory: Negative for shortness of breath and wheezing.   Cardiovascular: Negative for chest pain, palpitations and leg swelling.  Gastrointestinal: Negative for abdominal pain, nausea and vomiting.  Endocrine: Negative for  polydipsia, polyphagia and polyuria.  Genitourinary: Negative for decreased urine volume, dysuria and hematuria.  Musculoskeletal: Positive for arthralgias and back pain. Negative for joint swelling.  Skin: Negative for color change and pallor.  Neurological: Positive for numbness. Negative for weakness.  Psychiatric/Behavioral: Positive for sleep disturbance. The patient is nervous/anxious.       Current Outpatient Medications on File Prior to Visit  Medication Sig Dispense Refill  . amLODipine (NORVASC) 5 MG tablet Take 1 tablet (5 mg total) by mouth daily. 90 tablet 2  . Cholecalciferol (VITAMIN D PO) Take 500 Units by mouth daily.    . furosemide (LASIX) 20 MG tablet Take 1 tablet (20 mg total) by mouth daily as needed. 30 tablet 2  . glucose blood (ONETOUCH VERIO) test strip Use to test blood sugars once daily. 100 each 5  . lisinopril (PRINIVIL,ZESTRIL) 20 MG tablet Take 1 tablet (20 mg total) by mouth daily. 90 tablet 2  . meloxicam (MOBIC) 15 MG tablet TAKE 1 TABLET BY MOUTH EVERY DAY 90 tablet 0  . metFORMIN (GLUCOPHAGE-XR) 500 MG 24 hr tablet Take 1 tablet (500 mg total) by mouth 2 (two) times daily with a meal. 180 tablet 2  . methocarbamol (ROBAXIN) 500 MG tablet Take 1 tablet (500 mg total) by mouth every 8 (eight) hours as needed for muscle spasms. 20 tablet 0  . ONE TOUCH LANCETS MISC Use to test blood sugars once daily. 100 each 5  . orphenadrine (NORFLEX) 100 MG tablet Take 1 tablet (100 mg total) by mouth 2 (two) times daily. 30 tablet  0  . traMADol (ULTRAM) 50 MG tablet tramadol 50 mg tablet  Take 1 tablet 3 times a day by oral route as needed.     No current facility-administered medications on file prior to visit.      Past Medical History:  Diagnosis Date  . Arthritis   . Chronic low back pain   . Diabetes mellitus without complication ([place])   . Hypertension   . Obesity    Allergies  Allergen Reactions  . Other     Social History   Socioeconomic  History  . Marital status: Single    Spouse name: Not on file  . Number of children: Not on file  . Years of education: Not on file  . Highest education level: Not on file  Occupational History  . 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 Financial resource strain: Not on file  . Food insecurity:    Worry: Not on file    Inability: Not on file  . Transportation needs:    Medical: Not on file    Non-medical: Not on file  Tobacco Use  . Smoking status: Never Smoker  . Smokeless tobacco: Never Used  Substance and Sexual Activity  . Alcohol use: No  . Drug use: No  . Sexual activity: Not on file  Lifestyle  . Physical activity:    Days per week: Not on file    Minutes per session: Not on file  . Stress: Not on file  Relationships  . Social connections:    Talks on phone: Not on file    Gets together: Not on file    Attends religious service: Not on file    Active member of club or organization: Not on file    Attends meetings of clubs or organizations: Not on file    Relationship status: Not on file  Other Topics Concern  . Not on file  Social History Narrative  . Not on file    Vitals:   07/11/18 1533  BP: 140/88  Pulse: 100  Resp: 12  Temp: 98.3 F (36.8 C)  SpO2: 99%   Body mass index is 41.9 kg/m.   Physical Exam  Nursing note and vitals reviewed. Constitutional: She is oriented to person, place, and time. She appears well-developed. No distress.  HENT:  Head: Normocephalic and atraumatic.  Mouth/Throat: Oropharynx is clear and moist and mucous membranes are normal.  Eyes: Conjunctivae are normal.  Cardiovascular: Normal rate and regular rhythm.  No murmur heard. Pulses:      Dorsalis pedis pulses are 2+ on the right side, and 2+ on the left side.  Respiratory: Effort normal and breath sounds normal. No respiratory distress.  GI: Soft. She exhibits no mass. There is no  tenderness.  Musculoskeletal: She exhibits no edema.  No tenderness upon palpation of tarsus, IP and MTP joints.  Pain is not elicit with toes movement. No signs of synovitis.  Neurological: She is alert and oriented to person, place, and time. She has normal strength. Gait normal.  Skin: Skin is warm. No rash noted. No erythema.  Psychiatric: Her mood appears anxious.  Well groomed, good eye contact.     ASSESSMENT AND PLAN:    Ms. Romie was seen today for foot pain.  Diagnoses and all orders for this visit:  Foot pain, left  Possible etiologies discussed,including diabetic neuropathy,tenosynovitis,and radicular pain among some. Because she is reporting Hx  of trauma, foot X ray was ordered.  -     DG Foot Complete Left; Future  Neuropathic pain of left foot  We discussed a few treatment options, she agrees with trying Gabapentin. Side effects discussed. Instructed to start Gabapentin 100 mg at bedtime and titrate up to 300 mg as tolerated. Keep appt with Dr Nelva Bush. Keep next f/u appt.  -     gabapentin (NEURONTIN) 100 MG capsule; Take 1 capsule (100 mg total) by mouth at bedtime.  Essential hypertension  Mildly elevated BP today. Educated about importance of compliance with medication. Recommend monitoring BP. Continue low salt diet.    Return if symptoms worsen or fail to improve, for Keep next follow-up appointment..      Yasha Tibbett G. [place], MD  [facility]. [place] office.

## 2018-07-11 NOTE — Patient Instructions (Addendum)
A few things to remember from today's visit:   Foot pain, left - Plan: DG Foot Complete Left  Neuropathic pain of left foot - Plan: gabapentin (NEURONTIN) 100 MG capsule  Essential hypertension  We will start gabapentin 100 mg at bedtime, in 3 days increase gabapentin to 200 mg and increase it to 300 mg in another 3 days. Foot care, avoid trauma. Keep appointment with Dr. Nelva Bush.  Monitor blood pressure at home, today slightly elevated.  Please be sure medication list is accurate. If a new problem present, please set up appointment sooner than planned today.

## 2018-07-13 ENCOUNTER — Encounter: Payer: Self-pay | Admitting: Family Medicine

## 2018-07-13 DIAGNOSIS — M5126 Other intervertebral disc displacement, lumbar region: Secondary | ICD-10-CM | POA: Diagnosis not present

## 2018-07-18 DIAGNOSIS — M5126 Other intervertebral disc displacement, lumbar region: Secondary | ICD-10-CM | POA: Diagnosis not present

## 2018-07-20 DIAGNOSIS — M5126 Other intervertebral disc displacement, lumbar region: Secondary | ICD-10-CM | POA: Diagnosis not present

## 2018-07-21 DIAGNOSIS — M12272 Villonodular synovitis (pigmented), left ankle and foot: Secondary | ICD-10-CM | POA: Diagnosis not present

## 2018-07-21 DIAGNOSIS — M12271 Villonodular synovitis (pigmented), right ankle and foot: Secondary | ICD-10-CM | POA: Diagnosis not present

## 2018-07-21 DIAGNOSIS — M79671 Pain in right foot: Secondary | ICD-10-CM | POA: Diagnosis not present

## 2018-07-21 DIAGNOSIS — M79672 Pain in left foot: Secondary | ICD-10-CM | POA: Diagnosis not present

## 2018-07-25 DIAGNOSIS — M5126 Other intervertebral disc displacement, lumbar region: Secondary | ICD-10-CM | POA: Diagnosis not present

## 2018-07-27 ENCOUNTER — Other Ambulatory Visit: Payer: Self-pay | Admitting: Family Medicine

## 2018-07-27 DIAGNOSIS — E1149 Type 2 diabetes mellitus with other diabetic neurological complication: Secondary | ICD-10-CM

## 2018-07-27 DIAGNOSIS — M5126 Other intervertebral disc displacement, lumbar region: Secondary | ICD-10-CM | POA: Diagnosis not present

## 2018-07-27 DIAGNOSIS — R600 Localized edema: Secondary | ICD-10-CM

## 2018-07-27 DIAGNOSIS — I10 Essential (primary) hypertension: Secondary | ICD-10-CM

## 2018-07-28 DIAGNOSIS — M79605 Pain in left leg: Secondary | ICD-10-CM | POA: Diagnosis not present

## 2018-07-28 DIAGNOSIS — M545 Low back pain: Secondary | ICD-10-CM | POA: Diagnosis not present

## 2018-08-01 DIAGNOSIS — M5126 Other intervertebral disc displacement, lumbar region: Secondary | ICD-10-CM | POA: Diagnosis not present

## 2018-08-10 DIAGNOSIS — M5126 Other intervertebral disc displacement, lumbar region: Secondary | ICD-10-CM | POA: Diagnosis not present

## 2018-08-14 ENCOUNTER — Other Ambulatory Visit: Payer: Self-pay | Admitting: Family Medicine

## 2018-08-14 DIAGNOSIS — M792 Neuralgia and neuritis, unspecified: Secondary | ICD-10-CM

## 2018-08-15 DIAGNOSIS — M5126 Other intervertebral disc displacement, lumbar region: Secondary | ICD-10-CM | POA: Diagnosis not present

## 2018-08-21 DIAGNOSIS — M5416 Radiculopathy, lumbar region: Secondary | ICD-10-CM | POA: Diagnosis not present

## 2018-09-01 DIAGNOSIS — M5137 Other intervertebral disc degeneration, lumbosacral region: Secondary | ICD-10-CM | POA: Diagnosis not present

## 2018-09-18 DIAGNOSIS — M5416 Radiculopathy, lumbar region: Secondary | ICD-10-CM | POA: Diagnosis not present

## 2018-09-18 DIAGNOSIS — Z79899 Other long term (current) drug therapy: Secondary | ICD-10-CM | POA: Diagnosis not present

## 2018-10-04 DIAGNOSIS — M545 Low back pain: Secondary | ICD-10-CM | POA: Diagnosis not present

## 2018-10-04 DIAGNOSIS — E119 Type 2 diabetes mellitus without complications: Secondary | ICD-10-CM | POA: Diagnosis not present

## 2018-10-09 DIAGNOSIS — M545 Low back pain: Secondary | ICD-10-CM | POA: Diagnosis not present

## 2018-10-09 DIAGNOSIS — E119 Type 2 diabetes mellitus without complications: Secondary | ICD-10-CM | POA: Diagnosis not present

## 2018-10-13 DIAGNOSIS — M545 Low back pain: Secondary | ICD-10-CM | POA: Diagnosis not present

## 2018-10-13 DIAGNOSIS — E119 Type 2 diabetes mellitus without complications: Secondary | ICD-10-CM | POA: Diagnosis not present

## 2018-10-17 DIAGNOSIS — M545 Low back pain: Secondary | ICD-10-CM | POA: Diagnosis not present

## 2018-10-17 DIAGNOSIS — E119 Type 2 diabetes mellitus without complications: Secondary | ICD-10-CM | POA: Diagnosis not present

## 2018-10-19 DIAGNOSIS — M5416 Radiculopathy, lumbar region: Secondary | ICD-10-CM | POA: Diagnosis not present

## 2018-10-23 DIAGNOSIS — M545 Low back pain: Secondary | ICD-10-CM | POA: Diagnosis not present

## 2018-10-23 DIAGNOSIS — E119 Type 2 diabetes mellitus without complications: Secondary | ICD-10-CM | POA: Diagnosis not present

## 2019-01-18 DIAGNOSIS — M5416 Radiculopathy, lumbar region: Secondary | ICD-10-CM | POA: Diagnosis not present

## 2019-04-12 DIAGNOSIS — F4323 Adjustment disorder with mixed anxiety and depressed mood: Secondary | ICD-10-CM | POA: Diagnosis not present

## 2019-04-14 ENCOUNTER — Other Ambulatory Visit: Payer: Self-pay | Admitting: Family Medicine

## 2019-04-14 DIAGNOSIS — I10 Essential (primary) hypertension: Secondary | ICD-10-CM

## 2019-04-16 DIAGNOSIS — F4323 Adjustment disorder with mixed anxiety and depressed mood: Secondary | ICD-10-CM | POA: Diagnosis not present

## 2019-05-09 ENCOUNTER — Other Ambulatory Visit: Payer: Self-pay | Admitting: *Deleted

## 2019-05-09 DIAGNOSIS — I10 Essential (primary) hypertension: Secondary | ICD-10-CM

## 2019-05-09 MED ORDER — AMLODIPINE BESYLATE 5 MG PO TABS: 5.0000 mg | ORAL_TABLET | Freq: Every day | ORAL | 2 refills | Status: DC

## 2019-05-25 DIAGNOSIS — M5417 Radiculopathy, lumbosacral region: Secondary | ICD-10-CM | POA: Diagnosis not present

## 2019-05-25 DIAGNOSIS — M5416 Radiculopathy, lumbar region: Secondary | ICD-10-CM | POA: Diagnosis not present

## 2019-08-29 ENCOUNTER — Other Ambulatory Visit: Payer: Self-pay | Admitting: Family Medicine

## 2019-10-02 IMAGING — CT CT ABD-PELV W/ CM
2 of 5 series · 17 of 46 positions shown, 19 images · IV contrast (ISOVUE)
Comparison: 05/15/2018 ultrasound and 05/08/2018 CT

CLINICAL DATA: 57-year-old female with acute pelvic pain for 2
weeks.

EXAM:
CT ABDOMEN AND PELVIS WITH CONTRAST
TECHNIQUE: Multidetector CT imaging of the abdomen and pelvis was performed
using the standard protocol following bolus administration of
intravenous contrast.
CONTRAST:  100mL [id number] IOPAMIDOL ([id number]) INJECTION 61%

[Series 2: axial st · axial · 0.73mm/px · z∈[-126,+264]mm · 14 of 90 slices shown, 16 images]
[im 6/90  soft-tissue]
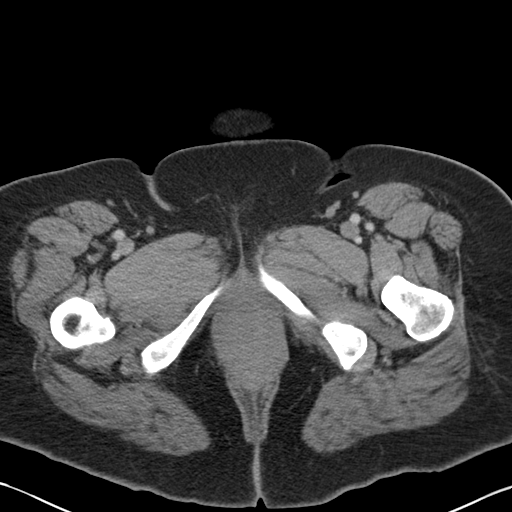
[im 6/90  bone]
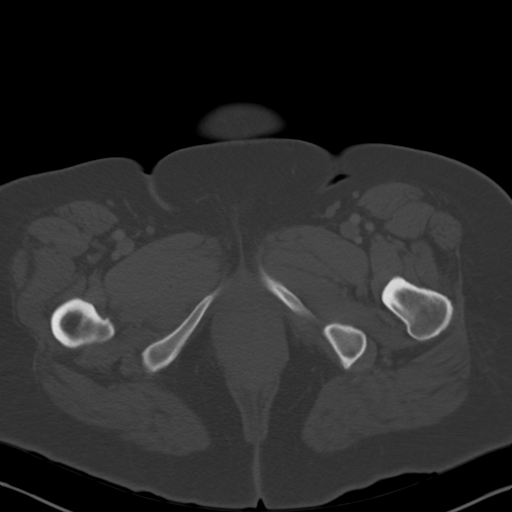
[im 12/90  soft-tissue]
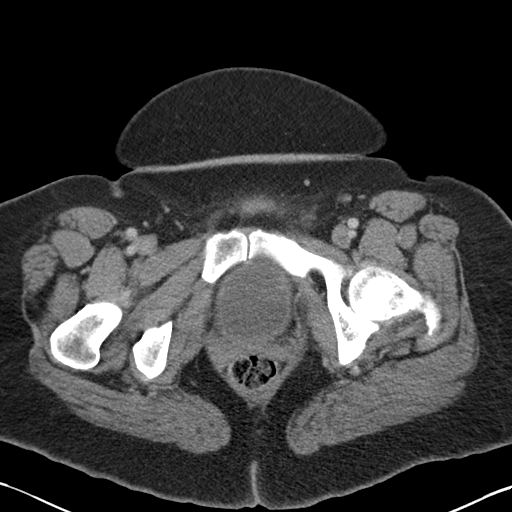
[im 18/90  soft-tissue]
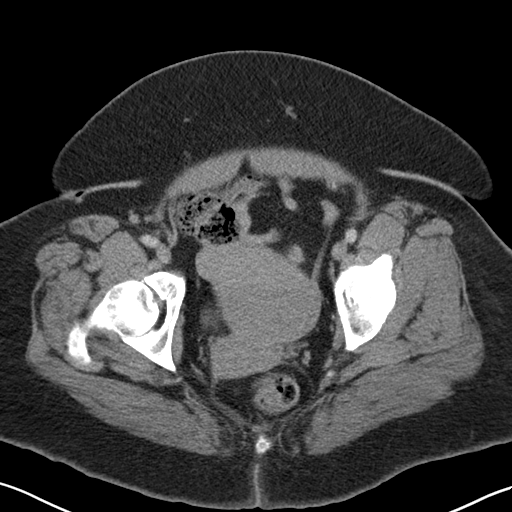
[im 24/90  soft-tissue]
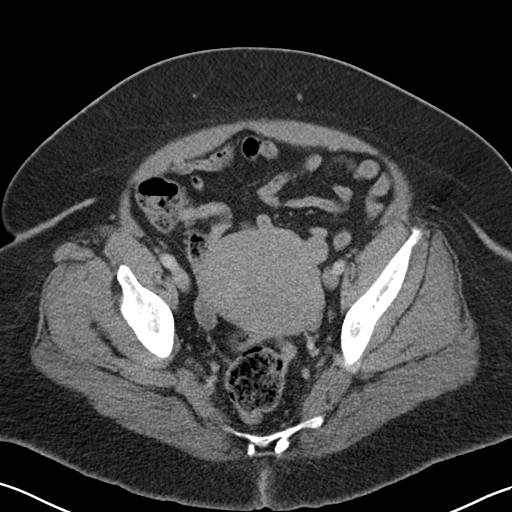
[im 30/90  soft-tissue]
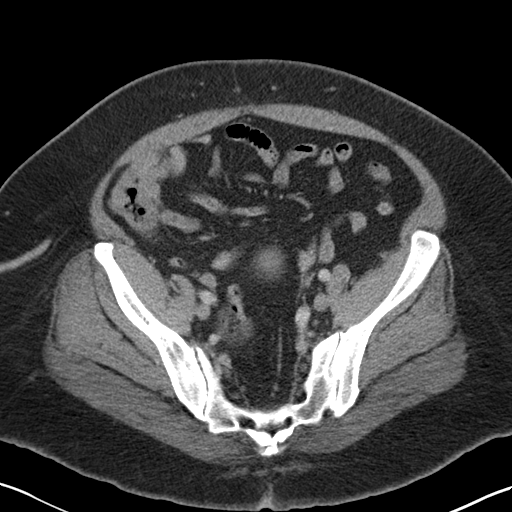
[im 36/90  soft-tissue]
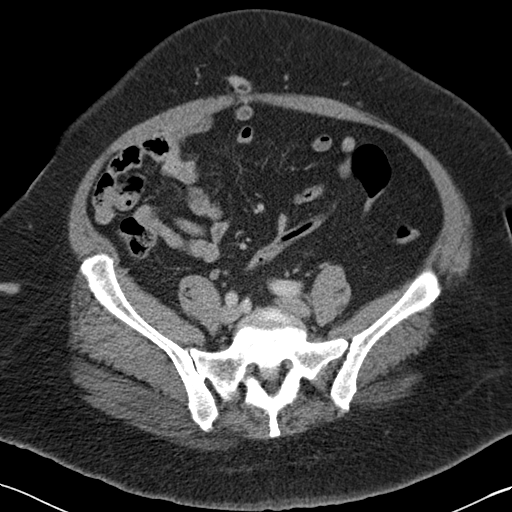
[im 42/90  soft-tissue]
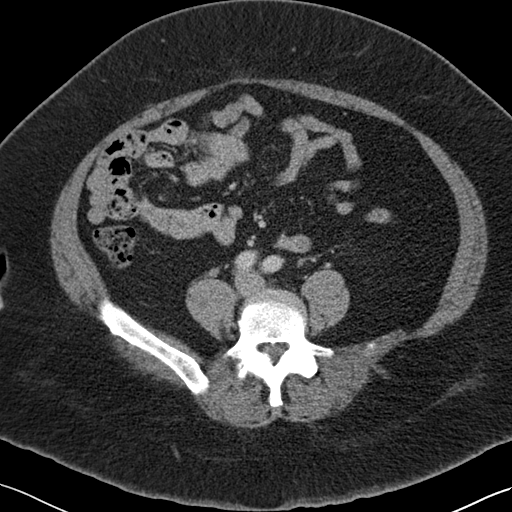
[im 48/90  soft-tissue]
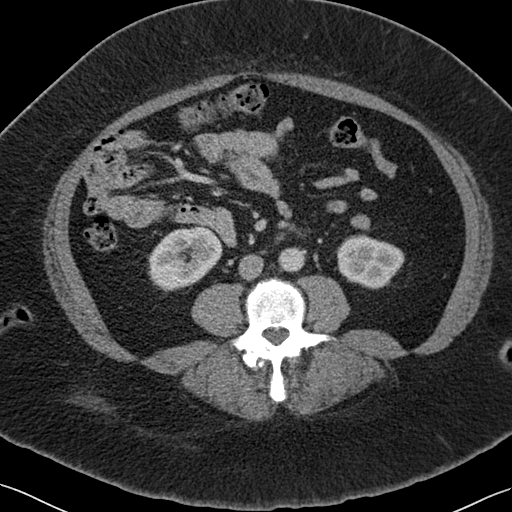
[im 54/90  soft-tissue]
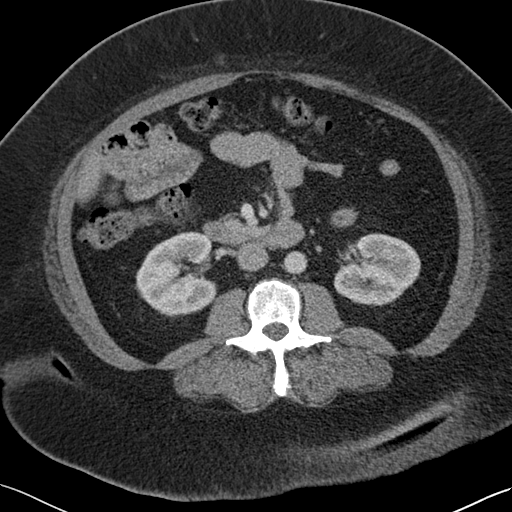
[im 54/90  bone]
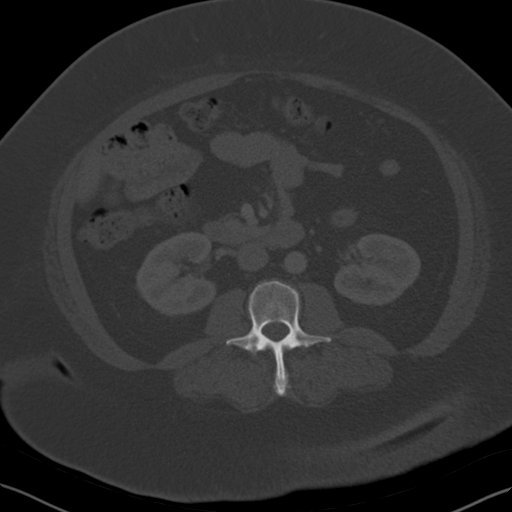
[im 60/90  soft-tissue]
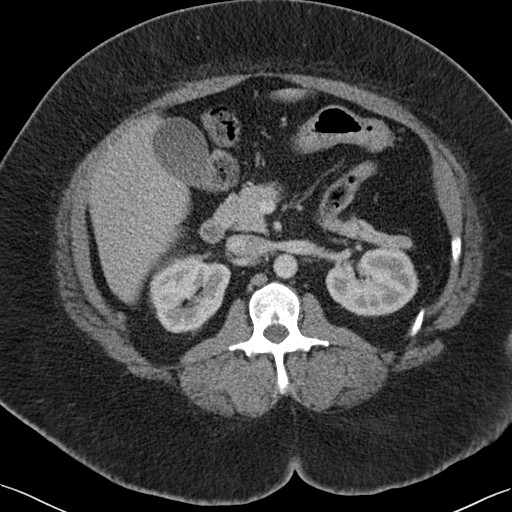
[im 66/90  soft-tissue]
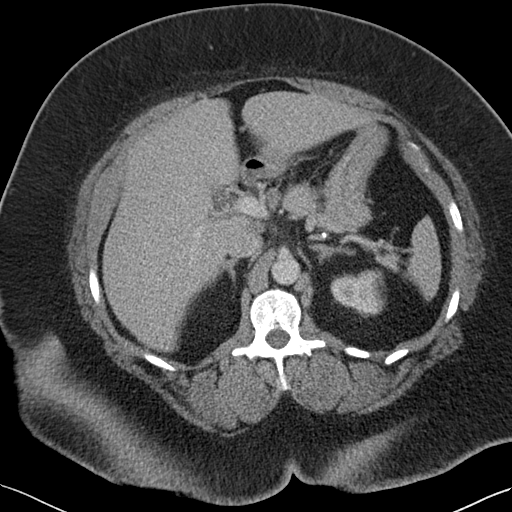
[im 72/90  soft-tissue]
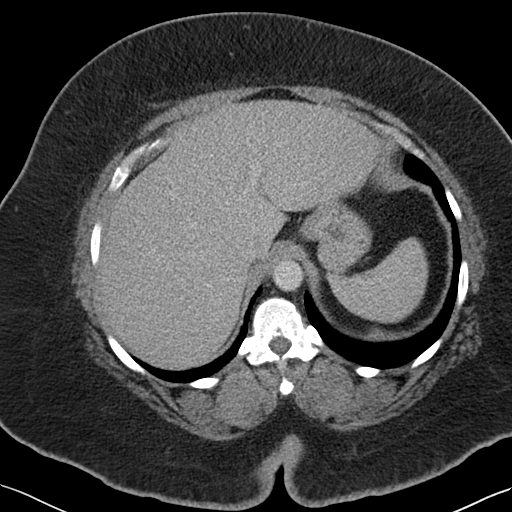
[im 78/90  soft-tissue]
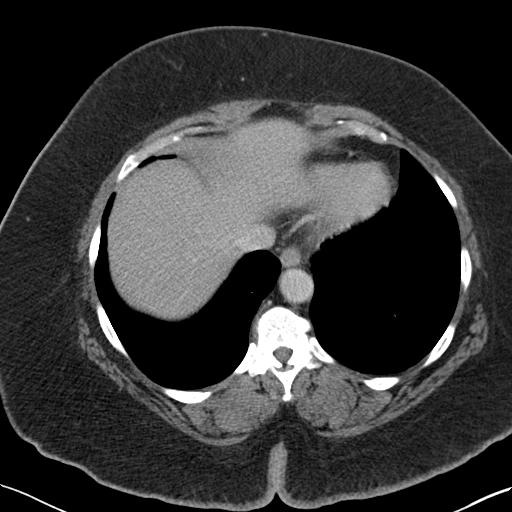
[im 84/90  soft-tissue]
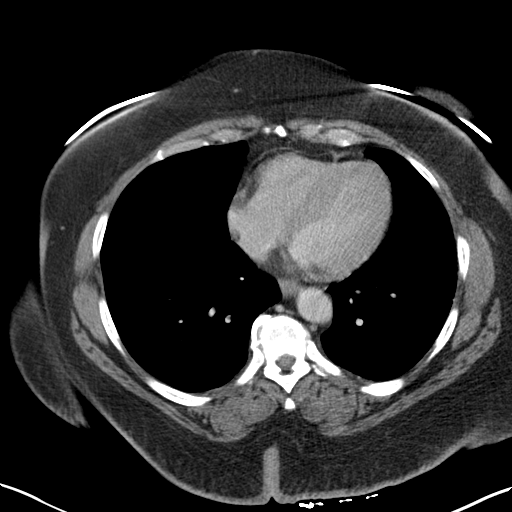

[Series 4: coronal st · coronal · 0.73mm/px · 3 of 112 slices shown]
[im 38/112  soft-tissue]
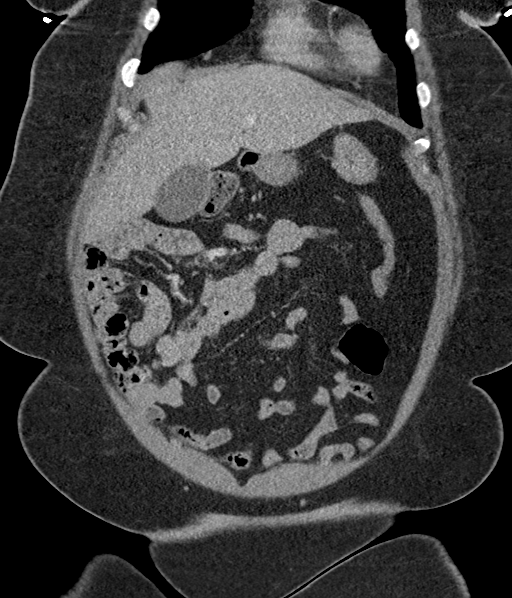
[im 50/112  soft-tissue]
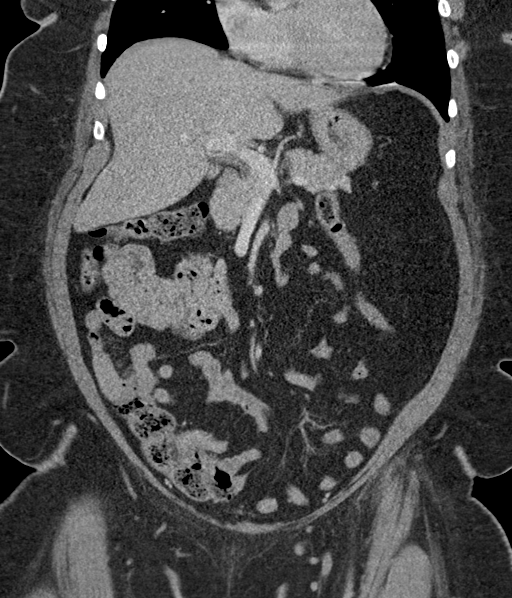
[im 62/112  soft-tissue]
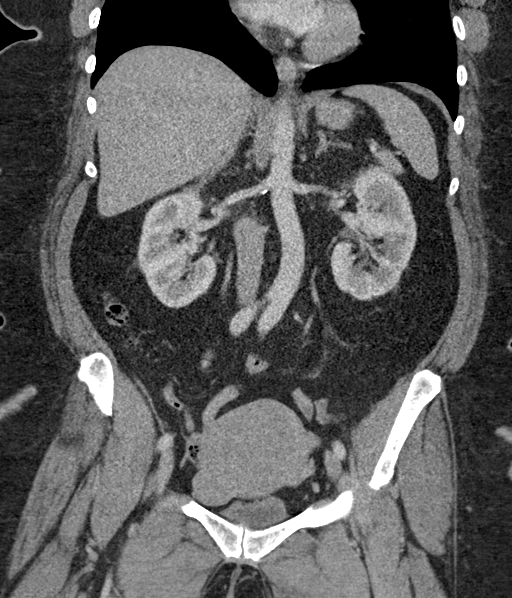

[17 of 46 positions shown; findings below may reference images not displayed]

FINDINGS: Lower chest: No acute abnormality.

Hepatobiliary: The liver and gallbladder are unremarkable. No
biliary dilatation.

Pancreas: Unremarkable

Spleen: Unremarkable

Adrenals/Urinary Tract: The kidneys, adrenal glands and bladder are
unremarkable.

Stomach/Bowel: Stomach is within normal limits. Appendix appears
normal. No evidence of bowel wall thickening, distention, or
inflammatory changes.

Vascular/Lymphatic: No significant vascular findings are present. No
enlarged abdominal or pelvic lymph nodes.

Reproductive: An enlarged fibroid uterus is again identified. No
adnexal masses.

Other: No ascites, focal collection, pneumoperitoneum or abdominal
wall hernia.

Musculoskeletal: No acute or suspicious bony abnormalities.
IMPRESSION: 1. No evidence of acute abnormality.
2. Enlarged fibroid uterus again identified.

## 2019-11-22 ENCOUNTER — Telehealth: Payer: Self-pay | Admitting: Family Medicine

## 2019-11-22 NOTE — Telephone Encounter (Signed)
Pt calling to let you know that she was tested on 11/21/2019 for COVID and result is positive. Thanks

## 2019-11-23 NOTE — Telephone Encounter (Signed)
FYI

## 2019-11-26 ENCOUNTER — Telehealth: Payer: Self-pay | Admitting: Family Medicine

## 2019-11-26 NOTE — Telephone Encounter (Signed)
Pt will need virtual visit - can put it right after or before her mother's visit tomorrow.

## 2019-11-26 NOTE — Telephone Encounter (Signed)
Pt is requesting a call, she has Covid and has just been taking over the counter mediation and feels she may need an antibiotic sent in. She is unsure of what she needs to do.

## 2019-11-27 ENCOUNTER — Telehealth (INDEPENDENT_AMBULATORY_CARE_PROVIDER_SITE_OTHER): Payer: 59 | Admitting: Family Medicine

## 2019-11-27 ENCOUNTER — Encounter: Payer: Self-pay | Admitting: Family Medicine

## 2019-11-27 DIAGNOSIS — U071 COVID-19: Secondary | ICD-10-CM

## 2019-11-27 DIAGNOSIS — J989 Respiratory disorder, unspecified: Secondary | ICD-10-CM

## 2019-11-27 DIAGNOSIS — R0989 Other specified symptoms and signs involving the circulatory and respiratory systems: Secondary | ICD-10-CM | POA: Diagnosis not present

## 2019-11-27 DIAGNOSIS — I10 Essential (primary) hypertension: Secondary | ICD-10-CM

## 2019-11-27 MED ORDER — ALBUTEROL SULFATE HFA 108 (90 BASE) MCG/ACT IN AERS: 2.0000 | INHALATION_SPRAY | Freq: Four times a day (QID) | RESPIRATORY_TRACT | 0 refills | Status: DC | PRN

## 2019-11-27 NOTE — Progress Notes (Signed)
Virtual Visit via Telephone Note  I connected with Jordan Ramirez on 11/27/19 at  3:30 PM EST by telephone and verified that I am speaking with the correct person using two identifiers.   I discussed the limitations, risks, security and privacy concerns of performing an evaluation and management service by telephone and the availability of in person appointments. I also discussed with the patient that there may be a patient responsible charge related to this service. The patient expressed understanding and agreed to proceed.  Location patient: home Location provider: work office Participants present for the call: patient, provider Patient did not have a visit in the prior 7 days to address this/these issue(s).   History of Present Illness: Jordan Ramirez is a 59 yo female with hx of DM II,HTN, OA, and allergies who was recently dx'ed with COVID 19 infection on 11/21/19 at [facility]. Symptoms started about 2.5 weeks ago with fatigue, light headache, diarrhea, and cough. Mild rhinorrhea. She did not have fever, sore throat,CP,SOB, anosmia,or ageusia.  Today she is feeling better. Most symptoms have resolved. She is still in quarantine, wonders if she needs another COVID 19 test.  Still having mild productive cough with clearish sputum, negative for hemoptysis. "Little wheezing" for the past few day, she has not had any today. No history of asthma.  DM2: BS usually < 125. She is not checking BP. Currently she is on amlodipine 5 mg daily and lisinopril 20 mg daily  Negative for visual changes, gross hematuria, decreased urine output, neurologic focal deficit, or worsening edema.  Observations/Objective: Patient sounds cheerful and well on the phone. I do not appreciate any SOB. Speech and thought processing are grossly intact. Patient reported vitals:  Assessment and Plan: 1. Reactive airway disease that is not asthma Monitor for new symptoms, including fever. Albuterol inh 2 puff  every 6 hours for a week then as needed for wheezing or shortness of breath.  Recommend checking pulse ox regularly, tomorrow she is getting pulse oximeter.  - albuterol (VENTOLIN HFA) 108 (90 Base) MCG/ACT inhaler; Inhale 2 puffs into the lungs every 6 (six) hours as needed for wheezing or shortness of breath.  Dispense: 8 g; Refill: 0  2. Essential hypertension Instructed to monitor BP regularly. No changes in current management. Continue low-salt diet.  3. COVID-19 virus infection Educated on diagnosis, prognosis, and treatment options. No further treatment is needed given the fact she has recovered. Recommend incentive spirometry.  Most likely she is no longer contagious, it was been > 2 weeks since she started with symptoms. She doe snot have to have COVID 19 test done again.   Follow Up Instructions:  Return if symptoms worsen or fail to improve, for Keep f/u appt.  I did not refer this patient for an OV in the next 24 hours for this/these issue(s).  I discussed the assessment and treatment plan with the patient. Jordan Ramirez was provided an opportunity to ask questions and all were answered. She agreed with the plan and demonstrated an understanding of the instructions.   I provided 11 minutes of non-face-to-face time during this encounter.   Aiyana Stegmann [place], MD

## 2019-12-17 ENCOUNTER — Other Ambulatory Visit: Payer: Self-pay | Admitting: Family Medicine

## 2019-12-17 DIAGNOSIS — J989 Respiratory disorder, unspecified: Secondary | ICD-10-CM

## 2019-12-17 DIAGNOSIS — R0989 Other specified symptoms and signs involving the circulatory and respiratory systems: Secondary | ICD-10-CM

## 2019-12-26 ENCOUNTER — Encounter: Payer: Self-pay | Admitting: Family Medicine

## 2019-12-26 ENCOUNTER — Telehealth (INDEPENDENT_AMBULATORY_CARE_PROVIDER_SITE_OTHER): Payer: 59 | Admitting: Family Medicine

## 2019-12-26 VITALS — Ht 67.0 in

## 2019-12-26 DIAGNOSIS — R0602 Shortness of breath: Secondary | ICD-10-CM | POA: Diagnosis not present

## 2019-12-26 DIAGNOSIS — G933 Postviral fatigue syndrome: Secondary | ICD-10-CM

## 2019-12-26 DIAGNOSIS — G9331 Postviral fatigue syndrome: Secondary | ICD-10-CM

## 2019-12-26 DIAGNOSIS — I10 Essential (primary) hypertension: Secondary | ICD-10-CM

## 2019-12-26 DIAGNOSIS — E1149 Type 2 diabetes mellitus with other diabetic neurological complication: Secondary | ICD-10-CM

## 2019-12-26 NOTE — Progress Notes (Signed)
Virtual Visit via Video Note   I connected with Jordan Ramirez on 12/26/19 by a video enabled telemedicine application and verified that I am speaking with the correct person using two identifiers.  Location patient: home Location provider:work office Persons participating in the virtual visit: patient, provider  I discussed the limitations of evaluation and management by telemedicine and the availability of in person appointments. The patient expressed understanding and agreed to proceed.  Chief Complaint  Patient presents with  . Fatigue    post covid    HPI: Jordan Ramirez is a 59 yo female with hx of HTN,CM II,OA,and chronic back pain c/o persistent fatigue. Dx'ed with COVID 19 infection on 11/21/19, symptoms resolved but still feeling very fatigue. No prior hx of fatigue.  Negative for fever,night sweats,chills,sore throat,anosmia,ageustia,or skin rash. She feels tired all the time. Sleeping well. It has been constant.  She feels SOB with intense exertion , no associated CP,palpitations,or diaphoresis. "Little" cough,no wheezing. She thinks it may be related to wt gain. Negative for nausea,vomiitng,or urinary symptoms. Occasional loose stool after eating fruit but this is not a new problem.   HTN: She is on Amlodipine 5 mg and Lisinopril 20 mg daily. She is not checking BP regularly. Negative for severe/frequent headache, visual changes, chest pain, focal weakness, or edema.  Lab Results  Component Value Date   CREATININE 1.00 05/22/2018   BUN 13 05/22/2018   NA 143 05/22/2018   K 3.7 05/22/2018   CL 107 05/22/2018   CO2 25 05/22/2018   DM II: BS's 110-140. Denies abdominal pain, nausea,vomiting, polydipsia,polyuria, or polyphagia. She is on Metformin XR 500 mg 2 tabs daily.  Lab Results  Component Value Date   HGBA1C 6.9 (H) 01/16/2018    ROS: See pertinent positives and negatives per HPI.  Past Medical History:  Diagnosis Date  . Arthritis   . Chronic low back  pain   . Diabetes mellitus without complication ([place])   . Hypertension   . Obesity     Past Surgical History:  Procedure Laterality Date  . PILONIDAL CYST EXCISION    . WISDOM TOOTH EXTRACTION      Family History  Problem Relation Age of Onset  . Coronary artery disease Mother   . Diabetes type II Mother   . Depression Mother   . Hypertension Mother   . Kidney failure Father     Social History   Socioeconomic History  . Marital status: Single    Spouse name: Not on file  . Number of children: Not on file  . Years of education: Not on file  . Highest education level: Not on file  Occupational History  . 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 Smoking status: Never Smoker  . Smokeless tobacco: Never Used  Substance and Sexual Activity  . Alcohol use: No  . Drug use: No  . Sexual activity: Not on file  Other Topics Concern  . Not on file  Social History Narrative  . Not on file   Social Determinants of Health   Financial Resource Strain:   . Difficulty of Paying Living Expenses:   Food Insecurity:   . Worried About Charity fundraiser in the Last Year:   . Arboriculturist in the Last Year:   Transportation Needs:   . Film/video editor (Medical):   Marland Kitchen Lack of Transportation (Non-Medical):   Physical Activity:   .  Days of Exercise per Week:   . Minutes of Exercise per Session:   Stress:   . Feeling of Stress :   Social Connections:   . Frequency of Communication with Friends and Family:   . Frequency of Social Gatherings with Friends and Family:   . Attends Religious Services:   . Active Member of Clubs or Organizations:   . Attends Archivist Meetings:   Marland Kitchen Marital Status:   Intimate Partner Violence:   . Fear of Current or Ex-Partner:   . Emotionally Abused:   Marland Kitchen Physically Abused:   . Sexually Abused:     Current Outpatient Medications:  .  albuterol  (VENTOLIN HFA) 108 (90 Base) MCG/ACT inhaler, TAKE 2 PUFFS BY MOUTH EVERY 6 HOURS AS NEEDED FOR WHEEZE OR SHORTNESS OF BREATH, Disp: 18 g, Rfl: 0 .  amLODipine (NORVASC) 5 MG tablet, Take 1 tablet (5 mg total) by mouth daily., Disp: 90 tablet, Rfl: 2 .  furosemide (LASIX) 20 MG tablet, TAKE 1 TABLET BY MOUTH EVERY DAY AS NEEDED, Disp: 90 tablet, Rfl: 0 .  gabapentin (NEURONTIN) 100 MG capsule, Take 1 capsule (100 mg total) by mouth at bedtime., Disp: 90 capsule, Rfl: 0 .  glucose blood (ONETOUCH VERIO) test strip, Use to test blood sugars once daily., Disp: 100 each, Rfl: 5 .  lisinopril (PRINIVIL,ZESTRIL) 20 MG tablet, TAKE 1 TABLET BY MOUTH EVERY DAY, Disp: 90 tablet, Rfl: 2 .  metFORMIN (GLUCOPHAGE-XR) 500 MG 24 hr tablet, Take 1 tablet (500 mg total) by mouth 2 (two) times daily with a meal., Disp: 180 tablet, Rfl: 2 .  ONE TOUCH LANCETS MISC, Use to test blood sugars once daily., Disp: 100 each, Rfl: 5 .  orphenadrine (NORFLEX) 100 MG tablet, Take 1 tablet (100 mg total) by mouth 2 (two) times daily., Disp: 30 tablet, Rfl: 0  EXAM:  VITALS per patient if applicable:Ht 5\' 7"  (1.702 m)   BMI 41.90 kg/m   GENERAL: alert, oriented, appears well and in no acute distress  HEENT: atraumatic, conjunctiva clear, no obvious abnormalities on inspection.  NECK: normal movements of the head and neck  LUNGS: on inspection no signs of respiratory distress, breathing rate appears normal, no obvious gross SOB, gasping or wheezing  CV: no obvious cyanosis  Jordan: moves all visible extremities without noticeable abnormality  PSYCH/NEURO: pleasant and cooperative, no obvious depression or anxiety, speech and thought processing grossly intact  ASSESSMENT AND PLAN:  Discussed the following assessment and plan: Orders Placed This Encounter  Procedures  . Comprehensive metabolic panel  . Hemoglobin A1c  . CBC  . Microalbumin / creatinine urine ratio    Postviral fatigue syndrome - Plan:  CBC Explained that it is not uncommon for some to have fatigue for weeks and even months. She is not longer contagious. Monitor for new or worsening symptoms.  Last times she had blood work in 2019.  Diabetes mellitus type 2 with neurological manifestations ([place]) HgA1C was at goal last time it was checked. No changes in current management.  Annual eye exam id due. Appropriate foot care. F/U in 5-6 months  Essential hypertension Recommend monitoring BP regularly. No changes in current management. Continue low salt diet.  SOB (shortness of breath) ? Deconditioning. Hx does not suggest a serious illness. Instructed about warning signs.  I discussed the assessment and treatment plan with the patient. Jordan Ramirez was provided an opportunity to ask questions and all were answered. She agreed with the plan and demonstrated an  understanding of the instructions.    Return in 4 months (on 04/26/2020) for appt, next week Tuesday to Thursday. .    Betty [place], MD

## 2019-12-27 ENCOUNTER — Telehealth: Payer: Self-pay | Admitting: Family Medicine

## 2019-12-27 NOTE — Telephone Encounter (Signed)
2nd VM to set up follow up

## 2019-12-27 NOTE — Telephone Encounter (Signed)
LVM to set up follow up appt that [place] requested--must be virtual

## 2020-02-12 ENCOUNTER — Other Ambulatory Visit: Payer: Self-pay

## 2020-02-13 ENCOUNTER — Encounter: Payer: Self-pay | Admitting: Family Medicine

## 2020-02-13 ENCOUNTER — Ambulatory Visit (INDEPENDENT_AMBULATORY_CARE_PROVIDER_SITE_OTHER): Payer: 59 | Admitting: Family Medicine

## 2020-02-13 VITALS — BP 132/84 | HR 96 | Temp 97.6°F | Resp 16 | Ht 67.0 in | Wt 292.0 lb

## 2020-02-13 DIAGNOSIS — E785 Hyperlipidemia, unspecified: Secondary | ICD-10-CM | POA: Diagnosis not present

## 2020-02-13 DIAGNOSIS — R600 Localized edema: Secondary | ICD-10-CM

## 2020-02-13 DIAGNOSIS — Z1159 Encounter for screening for other viral diseases: Secondary | ICD-10-CM

## 2020-02-13 DIAGNOSIS — Z0000 Encounter for general adult medical examination without abnormal findings: Secondary | ICD-10-CM | POA: Diagnosis not present

## 2020-02-13 DIAGNOSIS — Z6841 Body Mass Index (BMI) 40.0 and over, adult: Secondary | ICD-10-CM

## 2020-02-13 DIAGNOSIS — E1149 Type 2 diabetes mellitus with other diabetic neurological complication: Secondary | ICD-10-CM

## 2020-02-13 DIAGNOSIS — I10 Essential (primary) hypertension: Secondary | ICD-10-CM

## 2020-02-13 DIAGNOSIS — Z1211 Encounter for screening for malignant neoplasm of colon: Secondary | ICD-10-CM

## 2020-02-13 DIAGNOSIS — N938 Other specified abnormal uterine and vaginal bleeding: Secondary | ICD-10-CM | POA: Diagnosis not present

## 2020-02-13 DIAGNOSIS — Z9189 Other specified personal risk factors, not elsewhere classified: Secondary | ICD-10-CM

## 2020-02-13 DIAGNOSIS — N3946 Mixed incontinence: Secondary | ICD-10-CM

## 2020-02-13 LAB — URINALYSIS, ROUTINE W REFLEX MICROSCOPIC
Hgb urine dipstick: NEGATIVE
Ketones, ur: NEGATIVE
Leukocytes,Ua: NEGATIVE
Nitrite: NEGATIVE
RBC / HPF: NONE SEEN (ref 0–?)
Specific Gravity, Urine: >=1.03 — AB (ref 1.000–1.030)
Total Protein, Urine: 30 — AB
Urine Glucose: NEGATIVE
Urobilinogen, UA: 0.2 (ref 0.0–1.0)
pH: 5.5 (ref 5.0–8.0)

## 2020-02-13 LAB — HEMOGLOBIN A1C: Hgb A1c MFr Bld: 8.5 % — ABNORMAL HIGH (ref 4.6–6.5)

## 2020-02-13 LAB — COMPREHENSIVE METABOLIC PANEL
ALT: 33 U/L (ref 0–35)
AST: 19 U/L (ref 0–37)
Albumin: 4.1 g/dL (ref 3.5–5.2)
Alkaline Phosphatase: 94 U/L (ref 39–117)
BUN: 11 mg/dL (ref 6–23)
CO2: 30 mEq/L (ref 19–32)
Calcium: 9.3 mg/dL (ref 8.4–10.5)
Chloride: 104 mEq/L (ref 96–112)
Creatinine, Ser: 0.95 mg/dL (ref 0.40–1.20)
GFR: 72.59 mL/min (ref >60.00)
Glucose, Bld: 189 mg/dL — ABNORMAL HIGH (ref 70–99)
Potassium: 4.1 mEq/L (ref 3.5–5.1)
Sodium: 141 mEq/L (ref 135–145)
Total Bilirubin: 0.6 mg/dL (ref 0.2–1.2)
Total Protein: 7.1 g/dL (ref 6.0–8.3)

## 2020-02-13 LAB — MICROALBUMIN / CREATININE URINE RATIO
Creatinine,U: 419.5 mg/dL
Microalb Creat Ratio: 2.3 mg/g (ref 0.0–30.0)
Microalb, Ur: 9.7 mg/dL — ABNORMAL HIGH (ref 0.0–1.9)

## 2020-02-13 LAB — LIPID PANEL
Cholesterol: 235 mg/dL — ABNORMAL HIGH (ref 0–200)
HDL: 58.6 mg/dL (ref >39.00)
LDL Cholesterol: 150 mg/dL — ABNORMAL HIGH (ref 0–99)
NonHDL: 176.03
Total CHOL/HDL Ratio: 4
Triglycerides: 128 mg/dL (ref 0.0–149.0)
VLDL: 25.6 mg/dL (ref 0.0–40.0)

## 2020-02-13 LAB — TSH: TSH: 1.48 u[IU]/mL (ref 0.35–4.50)

## 2020-02-13 MED ORDER — AMLODIPINE BESYLATE 5 MG PO TABS: 5.0000 mg | ORAL_TABLET | Freq: Every day | ORAL | 2 refills | Status: DC

## 2020-02-13 MED ORDER — FUROSEMIDE 20 MG PO TABS: 20.0000 mg | ORAL_TABLET | Freq: Every day | ORAL | 1 refills | Status: DC | PRN

## 2020-02-13 MED ORDER — MIRABEGRON ER 25 MG PO TB24: 25.0000 mg | ORAL_TABLET | Freq: Every day | ORAL | 2 refills | Status: DC

## 2020-02-13 NOTE — Assessment & Plan Note (Signed)
She has gained about 25 pounds since 06/2018. We discussed benefits of wt loss as well as adverse effects of obesity. Consistency with healthy diet and physical activity recommended.

## 2020-02-13 NOTE — Assessment & Plan Note (Signed)
HgA1C was at goal when last checked, 2019. No changes in current management, treatment will be adjusted if needed. Regular exercise and healthy diet with avoidance of added sugar food intake is an important part of treatment and recommended. Annual eye exam, periodic dental and foot care recommended. F/U in 5-6 months

## 2020-02-13 NOTE — Assessment & Plan Note (Signed)
We discussed diagnosis, prognosis, and treatment options. She agrees with trying Myrbetriq 25 mg daily. Kegel and pelvic floor exercises also recommended.

## 2020-02-13 NOTE — Progress Notes (Signed)
Chief Complaint  Patient presents with  . Annual Exam  . Follow-up    HPI:  [name] is a 59 y.o. female, who is here today for her routine physical.  Last CPE: 2018.  Regular exercise 3 or more time per week: No Following a healthy diet: Not consistently. She has gained some wt. She lives with her mother, caregiver.  Chronic medical problems: Lower back pain with radiculopathy,DM II,HTN,HLD,asthma,OA,and allergic rhinitis among some.  Pap smear: 01/23/16. She follows with gyn, she is not sure about last visit but planning on arranging one.  Immunization History  Administered Date(s) Administered  . Td 09/06/2006   Mammogram: 09/2017. Colonoscopy: Never done. DEXA: Never Hep C screening: Never.  Chronic disease management and concerns today:  HLD: She is not on medication. She has not been consistent with following a low fat diet.  Lab Results  Component Value Date   CHOL 203 (H) 06/13/2017   HDL 53.90 06/13/2017   LDLCALC 127 (H) 06/13/2017   LDLDIRECT 118.5 04/17/2012   TRIG 110.0 06/13/2017   CHOLHDL 4 06/13/2017   Fatigue: Problem started when she was diagnosed with COVID-19 infection,all symptoms resolved except fatigue. Some SOB,she thinks it is caused by wt gain.  Lab Results  Component Value Date   TSH 1.17 09/30/2014   LMP 07/2018. after COVID 19 vaccine she had 2 days of light vaginal bleeding + pelvic cramps. She has had endometrial bx for metrorrhagia. She has not noted vaginal discharge. Not sexually active.  Lab Results  Component Value Date   WBC 6.6 05/22/2018   HGB 16.0 (H) 05/22/2018   HCT 47.3 (H) 05/22/2018   MCV 88.7 05/22/2018   PLT 200 05/22/2018   DM II:Dx;ed in 04/2012.  Negative for polydipsia,polyuria, or polyphagia.  She is on Metformin XR 500 mg 2 tabs daily. FG's < 130. She is not checking post prandial sugars.  Lab Results  Component Value Date   HGBA1C 6.9 (H) 01/16/2018   Lab Results  Component  Value Date   MICROALBUR 3.5 (H) 01/16/2018   MICROALBUR 5.7 (H) 06/13/2017   Left foot numbness,constant, decreased sensation. X 1 year. She is on gabapentin 100 mg at bedtime, which she takes as needed.  Last prescription given in 06/2018.  Takes furosemide 20 mg daily as needed to treat lower extremity edema. Problem has been stable.  HTN: Dx;ed in 1997. She is on Lisinopril 20 mg daily and Amlodipine 5 mg daily. She is not checking BP regularly.  Lab Results  Component Value Date   CREATININE 1.00 05/22/2018   BUN 13 05/22/2018   NA 143 05/22/2018   K 3.7 05/22/2018   CL 107 05/22/2018   CO2 25 05/22/2018    Urinary urgency and incontinence. + He also has mild urine leakage upon coughing or laughing. Negative for dysuria or gross hematuria.  Review of Systems  Constitutional: Negative for appetite change, fatigue and fever.  HENT: Negative for dental problem, hearing loss, mouth sores, sore throat and trouble swallowing.   Eyes: Negative for redness and visual disturbance.  Respiratory: Negative for cough, shortness of breath and wheezing.   Cardiovascular: Negative for chest pain, palpitations and leg swelling.  Gastrointestinal: Negative for abdominal pain, nausea and vomiting.       No changes in bowel habits.  Endocrine: Negative for cold intolerance and heat intolerance.  Genitourinary: Positive for vaginal bleeding. Negative for decreased urine volume, difficulty urinating, flank pain and vaginal discharge.  Musculoskeletal: Positive  for arthralgias and back pain. Negative for gait problem and myalgias.  Skin: Negative for color change and rash.  Allergic/Immunologic: Positive for environmental allergies.  Neurological: Negative for syncope, weakness and headaches.  Hematological: Negative for adenopathy. Does not bruise/bleed easily.  Psychiatric/Behavioral: Positive for sleep disturbance. Negative for confusion. The patient is not nervous/anxious.   All other  systems reviewed and are negative.  Current Outpatient Medications on File Prior to Visit  Medication Sig Dispense Refill  . albuterol (VENTOLIN HFA) 108 (90 Base) MCG/ACT inhaler TAKE 2 PUFFS BY MOUTH EVERY 6 HOURS AS NEEDED FOR WHEEZE OR SHORTNESS OF BREATH 18 g 0  . gabapentin (NEURONTIN) 100 MG capsule Take 1 capsule (100 mg total) by mouth at bedtime. (Patient taking differently: Take 100 mg by mouth as needed. ) 90 capsule 0  . glucose blood (ONETOUCH VERIO) test strip Use to test blood sugars once daily. 100 each 5  . lisinopril (PRINIVIL,ZESTRIL) 20 MG tablet TAKE 1 TABLET BY MOUTH EVERY DAY 90 tablet 2  . metFORMIN (GLUCOPHAGE-XR) 500 MG 24 hr tablet Take 1 tablet (500 mg total) by mouth 2 (two) times daily with a meal. 180 tablet 2  . ONE TOUCH LANCETS MISC Use to test blood sugars once daily. 100 each 5   No current facility-administered medications on file prior to visit.   Past Medical History:  Diagnosis Date  . Arthritis   . Chronic low back pain   . Diabetes mellitus without complication ([place])   . Hypertension   . Obesity     Past Surgical History:  Procedure Laterality Date  . PILONIDAL CYST EXCISION    . WISDOM TOOTH EXTRACTION      No Known Allergies  Family History  Problem Relation Age of Onset  . Coronary artery disease Mother   . Diabetes type II Mother   . Depression Mother   . Hypertension Mother   . Kidney failure Father     Social History   Socioeconomic History  . Marital status: Single    Spouse name: Not on file  . Number of children: Not on file  . Years of education: Not on file  . Highest education level: Not on file  Occupational History  . 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 Smoking status: Never Smoker  . Smokeless tobacco: Never Used  Substance and Sexual Activity  . Alcohol use: No  . Drug use: No  . Sexual activity: Not on file  Other  Topics Concern  . Not on file  Social History Narrative  . Not on file   Social Determinants of Health   Financial Resource Strain:   . Difficulty of Paying Living Expenses:   Food Insecurity:   . Worried About Charity fundraiser in the Last Year:   . Arboriculturist in the Last Year:   Transportation Needs:   . Film/video editor (Medical):   Marland Kitchen Lack of Transportation (Non-Medical):   Physical Activity:   . Days of Exercise per Week:   . Minutes of Exercise per Session:   Stress:   . Feeling of Stress :   Social Connections:   . Frequency of Communication with Friends and Family:   . Frequency of Social Gatherings with Friends and Family:   . Attends Religious Services:   . Active Member of Clubs or Organizations:   . Attends Archivist Meetings:   .  Marital Status:     Vitals:   02/13/20 1059  BP: 132/84  Pulse: 96  Resp: 16  Temp: 97.6 F (36.4 C)  SpO2: 96%   Body mass index is 45.73 kg/m.   Wt Readings from Last 3 Encounters:  02/13/20 292 lb (132.5 kg)  07/11/18 267 lb 8 oz (121.3 kg)  05/22/18 250 lb (113.4 kg)   Physical Exam  Nursing note and vitals reviewed. Constitutional: She is oriented to person, place, and time. She appears well-developed. No distress.  HENT:  Head: Normocephalic and atraumatic.  Right Ear: Hearing, tympanic membrane, external ear and ear canal normal.  Left Ear: Hearing, tympanic membrane, external ear and ear canal normal.  Mouth/Throat: Uvula is midline, oropharynx is clear and moist and mucous membranes are normal.  Eyes: Pupils are equal, round, and reactive to light. Conjunctivae and EOM are normal.  Neck: No tracheal deviation present. No thyromegaly present.  Cardiovascular: Normal rate and regular rhythm.  No murmur heard. Pulses:      Dorsalis pedis pulses are 2+ on the right side and 2+ on the left side.  Respiratory: Effort normal and breath sounds normal. No respiratory distress.  GI: Soft. She  exhibits no mass. There is no hepatomegaly. There is no abdominal tenderness.  Genitourinary:    Genitourinary Comments: Deferred to gyn.   Musculoskeletal:        General: No edema.     Comments: No major deformity or signs of synovitis appreciated.  Lymphadenopathy:    She has no cervical adenopathy.       Right: No supraclavicular adenopathy present.       Left: No supraclavicular adenopathy present.  Neurological: She is alert and oriented to person, place, and time. She has normal strength. No cranial nerve deficit. Coordination and gait normal.  Reflex Scores:      Bicep reflexes are 2+ on the right side and 2+ on the left side.      Patellar reflexes are 2+ on the right side and 2+ on the left side. Skin: Skin is warm. No rash noted. No erythema.  Psychiatric: She has a normal mood and affect. Cognition and memory are normal.  Well groomed, good eye contact.   Diabetic Foot Exam - Simple   Simple Foot Form Diabetic Foot exam was performed with the following findings: Yes 02/13/2020 12:03 PM  Visual Inspection No deformities, no ulcerations, no other skin breakdown bilaterally: Yes Sensation Testing Intact to touch and monofilament testing bilaterally: Yes Pulse Check Posterior Tibialis and Dorsalis pulse intact bilaterally: Yes Comments Decreased vibration test,bilateral.    ASSESSMENT AND PLAN:  Ms. Juiliana Mulholland was here today annual physical examination.  Orders Placed This Encounter  Procedures  . Comprehensive metabolic panel  . Hepatitis C antibody screen  . Lipid panel  . Hemoglobin A1c  . Microalbumin / creatinine urine ratio  . TSH  . Fructosamine  . CBC  . Urinalysis, Routine w reflex microscopic  . Ambulatory referral to Gastroenterology   Lab Results  Component Value Date   HGBA1C 8.5 (H) 02/13/2020   Lab Results  Component Value Date   TSH 1.48 02/13/2020   Lab Results  Component Value Date   MICROALBUR 9.7 (H) 02/13/2020   MICROALBUR 3.5  (H) 01/16/2018   Lab Results  Component Value Date   WBC 5.2 02/13/2020   HGB 14.7 02/13/2020   HCT 44.2 02/13/2020   MCV 90.8 02/13/2020   PLT 181.0 02/13/2020    Lab Results  Component Value Date   CREATININE 0.95 02/13/2020   BUN 11 02/13/2020   NA 141 02/13/2020   K 4.1 02/13/2020   CL 104 02/13/2020   CO2 30 02/13/2020    Lab Results  Component Value Date   ALT 33 02/13/2020   AST 19 02/13/2020   ALKPHOS 94 02/13/2020   BILITOT 0.6 02/13/2020   Lab Results  Component Value Date   CHOL 235 (H) 02/13/2020   HDL 58.60 02/13/2020   LDLCALC 150 (H) 02/13/2020   LDLDIRECT 118.5 04/17/2012   TRIG 128.0 02/13/2020   CHOLHDL 4 02/13/2020    Routine general medical examination at a health care facility We discussed the importance of regular physical activity and healthy diet for prevention of chronic illness and/or complications. Preventive guidelines reviewed. Vaccination: She is due for shingrix,Tdap,and pneumovax. She is not interested in vaccination today.  Ca++ and vit D supplementation recommended. Next CPE in a year.  Encounter for HCV screening test for high risk patient -     Hepatitis C antibody screen  Colon cancer screening -     Ambulatory referral to Gastroenterology  DUB (dysfunctional uterine bleeding) Instructed about warning signs. Strongly recommend arranging appt with her gyn.  Diabetes mellitus type 2 with neurological manifestations ([place]) HgA1C was at goal when last checked, 2019. No changes in current management, treatment will be adjusted if needed. Regular exercise and healthy diet with avoidance of added sugar food intake is an important part of treatment and recommended. Annual eye exam, periodic dental and foot care recommended. F/U in 5-6 months   Essential hypertension BP adequately controlled. Continue amlodipine 5 mg daily. Continue low-salt diet. She is overdue for eye exam.  Hyperlipemia She is not on statin  medication. Recommend low-fat diet for now. Recommendations in regard to pharmacologic treatment will be given according to A1c.  Class 3 severe obesity due to excess calories with serious comorbidity and body mass index (BMI) of 45.0 to 49.9 in adult [facility]) She has gained about 25 pounds since 06/2018. We discussed benefits of wt loss as well as adverse effects of obesity. Consistency with healthy diet and physical activity recommended.   Mixed incontinence urge and stress We discussed diagnosis, prognosis, and treatment options. She agrees with trying Myrbetriq 25 mg daily. Kegel and pelvic floor exercises also recommended.   Return in 6 months (on 08/14/2020) for dm 2,HTN,wt.   Magalene Mclear G. Martinique, MD  [facility]. [place] office.     Today you have you routine preventive visit. Please arrange appt with gyn. Medication for the urine started today. This exercise helps with mild urine leakage associated with cough, laughing, or sneezing. It may help with other types of urine incontinence and even with mild fecal incontinence.   Tighten and relax the pelvic muscles intermittently during the day. Once you are familiar with exercise try to hold pelvic muscles contraction for about 8-10 seconds.in the beginning you may not be able to hold contraction for more than a second or 2 but eventually you will be able to hold contraction harder and for longer time. Perform  8-12 exercises 3 times per day and daily for 15-20 weeks. You will need to continue exercises indefinitely to have a lasting effect.   At least 150 minutes of moderate exercise per week, daily brisk walking for 15-30 min is a good exercise option. Healthy diet low in saturated (animal) fats and sweets and consisting of fresh fruits and vegetables, lean meats such as fish and  white chicken and whole grains.  These are some of recommendations for screening depending of age and risk factors:  - Vaccines:  Tdap  vaccine every 10 years.  Shingles vaccine recommended at age 78, could be given after 60 years of age but not sure about insurance coverage.   Pneumonia vaccines: Pneumovax at 7. Sometimes Pneumovax is giving earlier if history of smoking, lung disease,diabetes,kidney disease among some.  Screening for diabetes at age 78 and every 3 years.N/A  Cervical cancer prevention:  Pap smear starts at 60 years of age and continues periodically until 60 years old in low risk women. Pap smear every 3 years between 45 and 61 years old. Pap smear every 3-5 years between women 34 and older if pap smear negative and HPV screening negative.   -Breast cancer: Mammogram: There is disagreement between experts about when to start screening in low risk asymptomatic female but recent recommendations are to start screening at 31 and not later than 60 years old , every 1-2 years and after 60 yo q 2 years. Screening is recommended until 60 years old but some women can continue screening depending of healthy issues.  Colon cancer screening: starts at 60 years old until 60 years old.  Cholesterol disorder screening at age 93 and every 3 years.N/A  Also recommended:  1. Dental visit- Brush and floss your teeth twice daily; visit your dentist twice a year. 2. Eye doctor- Get an eye exam at least every 2 years. 3. Helmet use- Always wear a helmet when riding a bicycle, motorcycle, rollerblading or skateboarding. 4. Safe sex- If you may be exposed to sexually transmitted infections, use a condom. 5. Seat belts- Seat belts can save your live; always wear one. 6. Smoke/Carbon Monoxide detectors- These detectors need to be installed on the appropriate level of your home. Replace batteries at least once a year. 7. Skin cancer- When out in the sun please cover up and use sunscreen 15 SPF or higher. 8. Violence- If anyone is threatening or hurting you, please tell your healthcare provider.  9. Drink alcohol in moderation-  Limit alcohol intake to one drink or less per day. Never drink and drive.

## 2020-02-13 NOTE — Assessment & Plan Note (Signed)
BP adequately controlled. Continue amlodipine 5 mg daily. Continue low-salt diet. She is overdue for eye exam.

## 2020-02-13 NOTE — Assessment & Plan Note (Signed)
She is not on statin medication. Recommend low-fat diet for now. Recommendations in regard to pharmacologic treatment will be given according to A1c.

## 2020-02-13 NOTE — Assessment & Plan Note (Signed)
Problem is well controlled.  Continue Furosemide 20 mg daily prn

## 2020-02-13 NOTE — Patient Instructions (Signed)
Today you have you routine preventive visit. Please arrange appt with gyn. Medication for the urine started today. This exercise helps with mild urine leakage associated with cough, laughing, or sneezing. It may help with other types of urine incontinence and even with mild fecal incontinence.   Tighten and relax the pelvic muscles intermittently during the day. Once you are familiar with exercise try to hold pelvic muscles contraction for about 8-10 seconds.in the beginning you may not be able to hold contraction for more than a second or 2 but eventually you will be able to hold contraction harder and for longer time. Perform  8-12 exercises 3 times per day and daily for 15-20 weeks. You will need to continue exercises indefinitely to have a lasting effect.   At least 150 minutes of moderate exercise per week, daily brisk walking for 15-30 min is a good exercise option. Healthy diet low in saturated (animal) fats and sweets and consisting of fresh fruits and vegetables, lean meats such as fish and white chicken and whole grains.  These are some of recommendations for screening depending of age and risk factors:  - Vaccines:  Tdap vaccine every 10 years.  Shingles vaccine recommended at age 17, could be given after 60 years of age but not sure about insurance coverage.   Pneumonia vaccines: Pneumovax at 78. Sometimes Pneumovax is giving earlier if history of smoking, lung disease,diabetes,kidney disease among some.  Screening for diabetes at age 14 and every 3 years.N/A  Cervical cancer prevention:  Pap smear starts at 60 years of age and continues periodically until 60 years old in low risk women. Pap smear every 3 years between 66 and 77 years old. Pap smear every 3-5 years between women 69 and older if pap smear negative and HPV screening negative.   -Breast cancer: Mammogram: There is disagreement between experts about when to start screening in low risk asymptomatic female but  recent recommendations are to start screening at 75 and not later than 60 years old , every 1-2 years and after 60 yo q 2 years. Screening is recommended until 60 years old but some women can continue screening depending of healthy issues.  Colon cancer screening: starts at 60 years old until 60 years old.  Cholesterol disorder screening at age 73 and every 3 years.N/A  Also recommended:  1. Dental visit- Brush and floss your teeth twice daily; visit your dentist twice a year. 2. Eye doctor- Get an eye exam at least every 2 years. 3. Helmet use- Always wear a helmet when riding a bicycle, motorcycle, rollerblading or skateboarding. 4. Safe sex- If you may be exposed to sexually transmitted infections, use a condom. 5. Seat belts- Seat belts can save your live; always wear one. 6. Smoke/Carbon Monoxide detectors- These detectors need to be installed on the appropriate level of your home. Replace batteries at least once a year. 7. Skin cancer- When out in the sun please cover up and use sunscreen 15 SPF or higher. 8. Violence- If anyone is threatening or hurting you, please tell your healthcare provider.  9. Drink alcohol in moderation- Limit alcohol intake to one drink or less per day. Never drink and drive.

## 2020-02-14 LAB — CBC
HCT: 44.2 % (ref 36.0–46.0)
Hemoglobin: 14.7 g/dL (ref 12.0–15.0)
MCHC: 33.3 g/dL (ref 30.0–36.0)
MCV: 90.8 fl (ref 78.0–100.0)
Platelets: 181 10*3/uL (ref 150.0–400.0)
RBC: 4.87 Mil/uL (ref 3.87–5.11)
RDW: 14.1 % (ref 11.5–15.5)
WBC: 5.2 10*3/uL (ref 4.0–10.5)

## 2020-02-16 ENCOUNTER — Other Ambulatory Visit: Payer: Self-pay | Admitting: Family Medicine

## 2020-02-16 DIAGNOSIS — I10 Essential (primary) hypertension: Secondary | ICD-10-CM

## 2020-02-19 ENCOUNTER — Telehealth: Payer: Self-pay | Admitting: *Deleted

## 2020-02-19 LAB — FRUCTOSAMINE: Fructosamine: 327 umol/L — ABNORMAL HIGH (ref 205–285)

## 2020-02-19 LAB — HEPATITIS C ANTIBODY
Hepatitis C Ab: NONREACTIVE
SIGNAL TO CUT-OFF: 0.03 (ref <1.00)

## 2020-02-19 NOTE — Telephone Encounter (Signed)
Alternative requested: Medication not covered: Myrbetriq ER 25 mg tablet. Please advise

## 2020-02-20 ENCOUNTER — Other Ambulatory Visit: Payer: Self-pay | Admitting: Family Medicine

## 2020-02-20 ENCOUNTER — Telehealth: Payer: Self-pay | Admitting: *Deleted

## 2020-02-20 ENCOUNTER — Other Ambulatory Visit: Payer: Self-pay | Admitting: *Deleted

## 2020-02-20 DIAGNOSIS — E1149 Type 2 diabetes mellitus with other diabetic neurological complication: Secondary | ICD-10-CM

## 2020-02-20 MED ORDER — METFORMIN HCL ER 500 MG PO TB24: ORAL_TABLET | ORAL | 2 refills | Status: DC

## 2020-02-20 MED ORDER — JARDIANCE 10 MG PO TABS
10.0000 mg | ORAL_TABLET | Freq: Every day | ORAL | 3 refills | Status: DC
Start: 2020-02-20 — End: 2020-11-05

## 2020-02-20 MED ORDER — TOLTERODINE TARTRATE ER 2 MG PO CP24: 2.0000 mg | ORAL_CAPSULE | Freq: Every day | ORAL | 2 refills | Status: DC

## 2020-02-20 MED ORDER — OXYBUTYNIN CHLORIDE ER 5 MG PO TB24: 5.0000 mg | ORAL_TABLET | Freq: Every day | ORAL | 0 refills | Status: DC

## 2020-02-20 NOTE — Telephone Encounter (Signed)
TOLTERODINE TART ER 2 MG CAP Alternative requested: Insurance does not cover without history of oxybutynin tried first. Please send in new Rx

## 2020-02-20 NOTE — Telephone Encounter (Signed)
Noted  

## 2020-02-20 NOTE — Telephone Encounter (Signed)
Rx for Detrol LA sent. Thanks, BJ

## 2020-02-20 NOTE — Telephone Encounter (Signed)
Prescription for oxybutynin 5 mg to take at bedtime sent to her pharmacy. Thanks, BJ

## 2020-03-24 ENCOUNTER — Encounter: Payer: Self-pay | Admitting: Family Medicine

## 2020-04-09 ENCOUNTER — Other Ambulatory Visit: Payer: Self-pay | Admitting: Family Medicine

## 2020-08-13 ENCOUNTER — Telehealth: Payer: Self-pay | Admitting: Family Medicine

## 2020-08-13 DIAGNOSIS — E1149 Type 2 diabetes mellitus with other diabetic neurological complication: Secondary | ICD-10-CM

## 2020-08-13 MED ORDER — ONETOUCH ULTRASOFT LANCETS MISC: 12 refills | Status: AC

## 2020-08-13 MED ORDER — ONETOUCH VERIO VI STRP: ORAL_STRIP | 12 refills | Status: DC

## 2020-08-13 MED ORDER — ONETOUCH VERIO W/DEVICE KIT: PACK | 1 refills | Status: AC

## 2020-08-13 NOTE — Telephone Encounter (Signed)
Patient needs a new glucose monitor  glucose blood (ONETOUCH VERIO) test strip  ONE TOUCH LANCETS MISC  [id number] - [place], [place] - [address] AT [place] [address] Phone:  [phone]  Fax:  [phone]

## 2020-08-13 NOTE — Addendum Note (Signed)
Addended by: Rodrigo Ran on: 08/13/2020 02:46 PM   Modules accepted: Orders

## 2020-08-13 NOTE — Telephone Encounter (Signed)
Testing supplies sent in. Pt is aware. Pt also wanted to know if the booster was recommended for her mom, advised her that Dr. Martinique is recommending the booster for anyone 65+. Pt verbalized understanding.

## 2020-08-20 ENCOUNTER — Other Ambulatory Visit: Payer: Self-pay | Admitting: Family Medicine

## 2020-08-20 DIAGNOSIS — Z1231 Encounter for screening mammogram for malignant neoplasm of breast: Secondary | ICD-10-CM

## 2020-09-30 ENCOUNTER — Ambulatory Visit: Payer: 59

## 2020-11-05 ENCOUNTER — Other Ambulatory Visit: Payer: Self-pay | Admitting: Family Medicine

## 2020-11-10 ENCOUNTER — Other Ambulatory Visit: Payer: Self-pay | Admitting: Family Medicine

## 2020-11-10 DIAGNOSIS — R600 Localized edema: Secondary | ICD-10-CM

## 2020-11-11 ENCOUNTER — Ambulatory Visit: Payer: 59

## 2020-11-12 ENCOUNTER — Other Ambulatory Visit: Payer: Self-pay | Admitting: Family Medicine

## 2020-11-12 DIAGNOSIS — R600 Localized edema: Secondary | ICD-10-CM

## 2020-12-24 ENCOUNTER — Ambulatory Visit: Payer: 59

## 2021-04-16 ENCOUNTER — Telehealth: Payer: Self-pay | Admitting: Family Medicine

## 2021-04-16 DIAGNOSIS — I10 Essential (primary) hypertension: Secondary | ICD-10-CM

## 2021-04-16 DIAGNOSIS — E1149 Type 2 diabetes mellitus with other diabetic neurological complication: Secondary | ICD-10-CM

## 2021-04-16 DIAGNOSIS — R600 Localized edema: Secondary | ICD-10-CM

## 2021-04-16 NOTE — Telephone Encounter (Signed)
amLODipine (NORVASC) 5 MG tablet  furosemide (LASIX) 20 MG tablet   lisinopril (ZESTRIL) 20 MG tablet   metFORMIN (GLUCOPHAGE-XR) 500 MG 24 hr tablet  JARDIANCE 10 MG TABS tablet      [id number] - [place],  - [address] AT [place] Phone:  [phone]  Fax:  [phone]

## 2021-04-17 MED ORDER — LISINOPRIL 20 MG PO TABS: 20.0000 mg | ORAL_TABLET | Freq: Every day | ORAL | 0 refills | Status: DC

## 2021-04-17 MED ORDER — EMPAGLIFLOZIN 10 MG PO TABS: 10.0000 mg | ORAL_TABLET | Freq: Every day | ORAL | 0 refills | Status: DC

## 2021-04-17 MED ORDER — AMLODIPINE BESYLATE 5 MG PO TABS: 5.0000 mg | ORAL_TABLET | Freq: Every day | ORAL | 0 refills | Status: DC

## 2021-04-17 MED ORDER — METFORMIN HCL ER 500 MG PO TB24: ORAL_TABLET | ORAL | 0 refills | Status: DC

## 2021-04-17 MED ORDER — FUROSEMIDE 20 MG PO TABS: 20.0000 mg | ORAL_TABLET | Freq: Every day | ORAL | 0 refills | Status: DC | PRN

## 2021-04-17 NOTE — Addendum Note (Signed)
Addended by: Westley Hummer B on: 04/17/2021 12:09 PM   Modules accepted: Orders

## 2021-04-17 NOTE — Telephone Encounter (Signed)
Refill sent.

## 2021-05-25 NOTE — Progress Notes (Deleted)
MyChart Video Visit  Virtual Visit via Video Note   This visit type was conducted due to national recommendations for restrictions regarding the COVID-19 Pandemic (e.g. social distancing) in an effort to limit this patient's exposure and mitigate transmission in our community. This patient is at least at moderate risk for complications without adequate follow up. This format is felt to be most appropriate for this patient at this time. Physical exam was limited by quality of the video and audio technology used for the visit.   Patient location: *** Provider location: ***  I discussed the limitations of evaluation and management by telemedicine and the availability of in person appointments. The patient expressed understanding and agreed to proceed.  Patient: Jordan Ramirez   DOB: 1961-02-13   60 y.o. Female  MRN: [id number] Visit Date: 05/26/2021  Today's healthcare provider: Shelaine Frie Martinique, MD   Chief Complaint  Patient presents with   Cough   Fatigue    Pt had covid 2 weeks ago, still experiencing fatigue.     Cough    Jordan Ramirez is a 60 y.o.female with hx of *** with above complaint.     *** Patient Active Problem List   Diagnosis Date Noted   Mixed incontinence urge and stress 02/13/2020   Bilateral lower extremity edema 02/13/2020   Ganglion cyst of dorsum of right wrist 01/16/2018   Bilateral knee pain 04/17/2012   Pain of left heel 04/17/2012   Diabetes mellitus type 2 with neurological manifestations ([place]) 08/28/2009   THUMB PAIN, LEFT 08/27/2009   Hyperlipemia 08/20/2009   Class 3 severe obesity due to excess calories with serious comorbidity and body mass index (BMI) of 45.0 to 49.9 in adult ([place]) 08/20/2009   ANEMIA-NOS 08/20/2009   Essential hypertension 08/20/2009   ALLERGIC RHINITIS 08/20/2009   ASTHMA 08/20/2009   MENORRHAGIA, PERIMENOPAUSAL 08/20/2009   DEGENERATIVE JOINT DISEASE, KNEE 08/20/2009   TENDINITIS, LEFT THUMB 08/20/2009   Past  Medical History:  Diagnosis Date   Arthritis    Chronic low back pain    Diabetes mellitus without complication (HCC)    Hypertension    Obesity    Social History   Tobacco Use   Smoking status: Never   Smokeless tobacco: Never  Substance Use Topics   Alcohol use: No   Drug use: No   No Known Allergies  Medications: Outpatient Medications Prior to Visit  Medication Sig   albuterol (VENTOLIN HFA) 108 (90 Base) MCG/ACT inhaler TAKE 2 PUFFS BY MOUTH EVERY 6 HOURS AS NEEDED FOR WHEEZE OR SHORTNESS OF BREATH   amLODipine (NORVASC) 5 MG tablet Take 1 tablet (5 mg total) by mouth daily.   Blood Glucose Monitoring Suppl (ONETOUCH VERIO) w/Device KIT Use to test blood sugars once daily.   empagliflozin (JARDIANCE) 10 MG TABS tablet Take 1 tablet (10 mg total) by mouth daily before breakfast.   furosemide (LASIX) 20 MG tablet Take 1 tablet (20 mg total) by mouth daily as needed.   gabapentin (NEURONTIN) 100 MG capsule Take 1 capsule (100 mg total) by mouth at bedtime. (Patient taking differently: Take 100 mg by mouth as needed. )   glucose blood (ONETOUCH VERIO) test strip Use to test blood sugars once daily.   Lancets (ONETOUCH ULTRASOFT) lancets Use to test blood sugars once daily.   lisinopril (ZESTRIL) 20 MG tablet Take 1 tablet (20 mg total) by mouth daily.   metFORMIN (GLUCOPHAGE-XR) 500 MG 24 hr tablet Take 1500 mg by mouth daily (500 mg with  breakfast and 1000 mg with supper)   oxybutynin (DITROPAN-XL) 5 MG 24 hr tablet TAKE 1 TABLET BY MOUTH EVERYDAY AT BEDTIME   No facility-administered medications prior to visit.    Review of Systems  Respiratory:  Positive for cough.   See pertinent positives and negatives per HPI.  Objective    There were no vitals taken for this visit.  GENERAL: alert, oriented, appears well and in no acute distress  HEENT: atraumatic, conjunctiva clear, no obvious abnormalities on inspection of external nose and ears  NECK: normal movements of  the head and neck  LUNGS: on inspection no signs of respiratory distress, breathing rate appears normal, no obvious gross SOB, gasping or wheezing  CV: no obvious cyanosis  MS: moves all visible extremities without noticeable abnormality  PSYCH/NEURO: pleasant and cooperative, no obvious depression or anxiety, speech and thought processing grossly intact   Assessment & Plan     ***  No follow-ups on file.     I discussed the assessment and treatment plan with the patient. The patient was provided an opportunity to ask questions and all were answered. The patient agreed with the plan and demonstrated an understanding of the instructions.   The patient was advised to call back or seek an in-person evaluation if the symptoms worsen or if the condition fails to improve as anticipated.   Jordan Foss Martinique, MD [facility] (phone) [phone] (fax)  [place]

## 2021-05-26 ENCOUNTER — Encounter: Payer: Self-pay | Admitting: Family Medicine

## 2021-05-26 ENCOUNTER — Encounter: Payer: 59 | Admitting: Family Medicine

## 2021-05-27 NOTE — Progress Notes (Deleted)
MyChart Video Visit  Virtual Visit via Video Note   This visit type was conducted due to national recommendations for restrictions regarding the COVID-19 Pandemic (e.g. social distancing) in an effort to limit this patient's exposure and mitigate transmission in our community. This patient is at least at moderate risk for complications without adequate follow up. This format is felt to be most appropriate for this patient at this time. Physical exam was limited by quality of the video and audio technology used for the visit.   Patient location: *** Provider location: ***  I discussed the limitations of evaluation and management by telemedicine and the availability of in person appointments. The patient expressed understanding and agreed to proceed.  Patient: Jordan Ramirez   DOB: 1960/12/17   60 y.o. Female  MRN: [id number] Visit Date: 05/29/2021  Today's healthcare provider: Betty Martinique, MD   No chief complaint on file.  HPI   Jordan Ramirez is a 60 y.o.female with hx of *** with above complaint.     *** Patient Active Problem List   Diagnosis Date Noted   Mixed incontinence urge and stress 02/13/2020   Bilateral lower extremity edema 02/13/2020   Ganglion cyst of dorsum of right wrist 01/16/2018   Bilateral knee pain 04/17/2012   Pain of left heel 04/17/2012   Diabetes mellitus type 2 with neurological manifestations ([place]) 08/28/2009   THUMB PAIN, LEFT 08/27/2009   Hyperlipemia 08/20/2009   Class 3 severe obesity due to excess calories with serious comorbidity and body mass index (BMI) of 45.0 to 49.9 in adult ([place]) 08/20/2009   ANEMIA-NOS 08/20/2009   Essential hypertension 08/20/2009   ALLERGIC RHINITIS 08/20/2009   ASTHMA 08/20/2009   MENORRHAGIA, PERIMENOPAUSAL 08/20/2009   DEGENERATIVE JOINT DISEASE, KNEE 08/20/2009   TENDINITIS, LEFT THUMB 08/20/2009   Past Medical History:  Diagnosis Date   Arthritis    Chronic low back pain    Diabetes mellitus without  complication (HCC)    Hypertension    Obesity    Social History   Tobacco Use   Smoking status: Never   Smokeless tobacco: Never  Substance Use Topics   Alcohol use: No   Drug use: No   No Known Allergies  Medications: Outpatient Medications Prior to Visit  Medication Sig   albuterol (VENTOLIN HFA) 108 (90 Base) MCG/ACT inhaler TAKE 2 PUFFS BY MOUTH EVERY 6 HOURS AS NEEDED FOR WHEEZE OR SHORTNESS OF BREATH   amLODipine (NORVASC) 5 MG tablet Take 1 tablet (5 mg total) by mouth daily.   Blood Glucose Monitoring Suppl (ONETOUCH VERIO) w/Device KIT Use to test blood sugars once daily.   empagliflozin (JARDIANCE) 10 MG TABS tablet Take 1 tablet (10 mg total) by mouth daily before breakfast.   furosemide (LASIX) 20 MG tablet Take 1 tablet (20 mg total) by mouth daily as needed.   gabapentin (NEURONTIN) 100 MG capsule Take 1 capsule (100 mg total) by mouth at bedtime. (Patient taking differently: Take 100 mg by mouth as needed.)   glucose blood (ONETOUCH VERIO) test strip Use to test blood sugars once daily.   Lancets (ONETOUCH ULTRASOFT) lancets Use to test blood sugars once daily.   lisinopril (ZESTRIL) 20 MG tablet Take 1 tablet (20 mg total) by mouth daily.   metFORMIN (GLUCOPHAGE-XR) 500 MG 24 hr tablet Take 1500 mg by mouth daily (500 mg with breakfast and 1000 mg with supper)   oxybutynin (DITROPAN-XL) 5 MG 24 hr tablet TAKE 1 TABLET BY MOUTH EVERYDAY AT BEDTIME  No facility-administered medications prior to visit.    Review of Systems See pertinent positives and negatives per HPI.  Objective    There were no vitals taken for this visit.  GENERAL: alert, oriented, appears well and in no acute distress  HEENT: atraumatic, conjunctiva clear, no obvious abnormalities on inspection of external nose and ears  NECK: normal movements of the head and neck  LUNGS: on inspection no signs of respiratory distress, breathing rate appears normal, no obvious gross SOB, gasping or  wheezing  CV: no obvious cyanosis  MS: moves all visible extremities without noticeable abnormality  PSYCH/NEURO: pleasant and cooperative, no obvious depression or anxiety, speech and thought processing grossly intact   Assessment & Plan     ***  No follow-ups on file.     I discussed the assessment and treatment plan with the patient. The patient was provided an opportunity to ask questions and all were answered. The patient agreed with the plan and demonstrated an understanding of the instructions.   The patient was advised to call back or seek an in-person evaluation if the symptoms worsen or if the condition fails to improve as anticipated.   Betty Martinique, MD [facility] (phone) [phone] (fax)  [place]

## 2021-05-29 ENCOUNTER — Telehealth: Payer: Self-pay | Admitting: Family Medicine

## 2021-05-29 ENCOUNTER — Encounter: Payer: Self-pay | Admitting: Family Medicine

## 2021-05-29 ENCOUNTER — Telehealth (INDEPENDENT_AMBULATORY_CARE_PROVIDER_SITE_OTHER): Payer: Self-pay | Admitting: Family Medicine

## 2021-05-29 VITALS — Ht 67.0 in

## 2021-05-29 DIAGNOSIS — I10 Essential (primary) hypertension: Secondary | ICD-10-CM

## 2021-05-29 DIAGNOSIS — E785 Hyperlipidemia, unspecified: Secondary | ICD-10-CM

## 2021-05-29 DIAGNOSIS — R053 Chronic cough: Secondary | ICD-10-CM

## 2021-05-29 DIAGNOSIS — E1149 Type 2 diabetes mellitus with other diabetic neurological complication: Secondary | ICD-10-CM

## 2021-05-29 DIAGNOSIS — R413 Other amnesia: Secondary | ICD-10-CM

## 2021-05-29 DIAGNOSIS — J4520 Mild intermittent asthma, uncomplicated: Secondary | ICD-10-CM

## 2021-05-29 MED ORDER — BENZONATATE 100 MG PO CAPS: 200.0000 mg | ORAL_CAPSULE | Freq: Two times a day (BID) | ORAL | 0 refills | Status: AC | PRN

## 2021-05-29 MED ORDER — ALBUTEROL SULFATE HFA 108 (90 BASE) MCG/ACT IN AERS: INHALATION_SPRAY | RESPIRATORY_TRACT | 1 refills | Status: DC

## 2021-05-29 MED ORDER — PREDNISONE 20 MG PO TABS: 40.0000 mg | ORAL_TABLET | Freq: Every day | ORAL | 0 refills | Status: AC

## 2021-05-29 NOTE — Progress Notes (Addendum)
Virtual Visit via Video Note I connected with Jordan Ramirez on 05/29/21 by a video enabled telemedicine application and verified that I am speaking with the correct person using two identifiers.  Location patient: home Location provider:work office Persons participating in the virtual visit: patient, provider  I discussed the limitations of evaluation and management by telemedicine and the availability of in person appointments. The patient expressed understanding and agreed to proceed.  Chief Complaint  Patient presents with   Cough   Fatigue   HPI: Jordan Ramirez is a 61 yo female with hx of DM II,HLD,HTN,and OA with above concerns. Positive home COVID 19 test on 05/06/18 when she stated with respiratory symptoms.  Symptoms started while she was in [place] in a convention, repeated COVID-19 test next day, positive. She is concerned because she thinks symptoms should have been resolved.  Still having productive cough, clear sputum, denies hemoptysis. Wheezing" sometimes", no associated SOB. She is taking elderberry cough medication she got from the vitamin shop.  She has hx of asthma, Dx'ed about 30 years ago. She has used Albuterol inh before and it helped with respiratory symptoms. She is not having nocturnal symptoms and cough is not interfering with sleep.  + Intermittent chills, nasal congestion, rhinorrhea, and postnasal drainage. + Fatigue. Negative for fever,changes in bowel habits,urinary symptoms,or skin rash. She is having a "annoyed" headache.  She is also concerned about memory difficulties noted after COVID-19 infection in 11/2019.  Problem seems to be stable. She has difficulty with finding words and forgetting "some responsibilities." States does "not repeat myself." Her mother has history of Alzheimer's dementia. She denies depressed mood.  DM II: Last follow up in 01/2020. She is not checking BS regularly. Currently she is on Glucophage XR 500 mg 2 tablets daily and  Jardiance 10 mg daily. She is tolerating medication well, no side effect reported. Negative for abdominal pain, nausea,vomiting, polydipsia,polyuria, or polyphagia.  Lab Results  Component Value Date   HGBA1C 8.5 (H) 02/13/2020   Hypertension: Currently she is on amlodipine 5 mg daily and lisinopril 20 mg daily. Negative for visual changes, chest pain, DOE, palpitation, claudication,or unusual edema.  Lab Results  Component Value Date   CREATININE 0.95 02/13/2020   BUN 11 02/13/2020   NA 141 02/13/2020   K 4.1 02/13/2020   CL 104 02/13/2020   CO2 30 02/13/2020   Hyperlipidemia: She is not on pharmacologic treatment. Lovastatin 20 mg was recommended 01/2020.  Lab Results  Component Value Date   CHOL 235 (H) 02/13/2020   HDL 58.60 02/13/2020   LDLCALC 150 (H) 02/13/2020   LDLDIRECT 118.5 04/17/2012   TRIG 128.0 02/13/2020   CHOLHDL 4 02/13/2020   ROS: See pertinent positives and negatives per HPI.  Past Medical History:  Diagnosis Date   Arthritis    Chronic low back pain    Diabetes mellitus without complication ([place])    Hypertension    Obesity    Past Surgical History:  Procedure Laterality Date   PILONIDAL CYST EXCISION     WISDOM TOOTH EXTRACTION     Family History  Problem Relation Age of Onset   Coronary artery disease Mother    Diabetes type II Mother    Depression Mother    Hypertension Mother    Kidney failure Father    Social History   Socioeconomic History   Marital status: Single    Spouse name: Not on file   Number of children: Not on file   Years of education:  Not on file   Highest education level: Not on file  Occupational History   Occupation: Unemployed    Comment: 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, international aid  Tobacco Use   Smoking status: Never   Smokeless tobacco: Never  Substance and Sexual Activity   Alcohol use: No   Drug use: No   Sexual activity: Not on file  Other Topics Concern   Not on file   Social History Narrative   Not on file   Social Determinants of Health   Financial Resource Strain: Not on file  Food Insecurity: Not on file  Transportation Needs: Not on file  Physical Activity: Not on file  Stress: Not on file  Social Connections: Not on file  Intimate Partner Violence: Not on file   Current Outpatient Medications:    albuterol (VENTOLIN HFA) 108 (90 Base) MCG/ACT inhaler, TAKE 2 PUFFS BY MOUTH EVERY 6 HOURS AS NEEDED FOR WHEEZE OR SHORTNESS OF BREATH, Disp: 18 g, Rfl: 0   amLODipine (NORVASC) 5 MG tablet, Take 1 tablet (5 mg total) by mouth daily., Disp: 90 tablet, Rfl: 0   Blood Glucose Monitoring Suppl (ONETOUCH VERIO) w/Device KIT, Use to test blood sugars once daily., Disp: 1 kit, Rfl: 1   empagliflozin (JARDIANCE) 10 MG TABS tablet, Take 1 tablet (10 mg total) by mouth daily before breakfast., Disp: 30 tablet, Rfl: 0   furosemide (LASIX) 20 MG tablet, Take 1 tablet (20 mg total) by mouth daily as needed., Disp: 90 tablet, Rfl: 0   gabapentin (NEURONTIN) 100 MG capsule, Take 1 capsule (100 mg total) by mouth at bedtime. (Patient taking differently: Take 100 mg by mouth as needed.), Disp: 90 capsule, Rfl: 0   glucose blood (ONETOUCH VERIO) test strip, Use to test blood sugars once daily., Disp: 100 each, Rfl: 12   Lancets (ONETOUCH ULTRASOFT) lancets, Use to test blood sugars once daily., Disp: 100 each, Rfl: 12   lisinopril (ZESTRIL) 20 MG tablet, Take 1 tablet (20 mg total) by mouth daily., Disp: 90 tablet, Rfl: 0   metFORMIN (GLUCOPHAGE-XR) 500 MG 24 hr tablet, Take 1500 mg by mouth daily (500 mg with breakfast and 1000 mg with supper), Disp: 180 tablet, Rfl: 0   oxybutynin (DITROPAN-XL) 5 MG 24 hr tablet, TAKE 1 TABLET BY MOUTH EVERYDAY AT BEDTIME, Disp: 90 tablet, Rfl: 0  EXAM:  VITALS per patient if applicable:Ht <[id number]>_3<\/[id number]>_3  (1.702 m)   BMI 45.73 kg/m   GENERAL: alert, oriented, appears well and in no acute distress  HEENT: atraumatic, conjunctiva clear, no  obvious abnormalities on inspection.  NECK: normal movements of the head and neck  LUNGS: on inspection no signs of respiratory distress, breathing rate appears normal, no obvious gross SOB, gasping or wheezing  CV: no obvious cyanosis  MS: moves all visible extremities without noticeable abnormality  PSYCH/NEURO: pleasant and cooperative, no obvious depression or anxiety, speech and thought processing grossly intact  ASSESSMENT AND PLAN:  Discussed the following assessment and plan:  Cough, persistent - Plan: benzonatate (TESSALON) 100 MG capsule, DG Chest 2 View Recently recovered from COVID-19 infection. Explained that cough and congestion as well as fatigue can last a few more days and even weeks after acute symptoms have resolved. Symptomatic treatment with benzonatate recommended. Plain Mucinex may also help. Chest x-ray order placed.  Mild intermittent asthma without complication - Plan: predniSONE (DELTASONE) 20 MG tablet, albuterol (VENTOLIN HFA) 108 (90 Base) MCG/ACT inhaler Albuterol inh 2 puff every 6 hours for a  week then as needed for wheezing or shortness of breath.  She has taking prednisone in the past for lower back pain, well-tolerated, she agrees with short course of prednisone 40 mg, 3 to 5 days.  Instructed to take medication with food. Instructed about warning signs.  Essential hypertension - Plan: Comprehensive metabolic panel Continue amlodipine 5 mg daily and lisinopril 20 mg daily. Monitor BP at home. Low-salt diet to continue.  Diabetes mellitus type 2 with neurological manifestations ([place]) - Plan: Comprehensive metabolic panel, Hemoglobin A1c, Fructosamine, Microalbumin / creatinine urine ratio  Memory difficulties - Plan: Ambulatory referral to Neuropsychology, Comprehensive metabolic panel, TSH, Vitamin B12, CBC We discussed possible etiologies.  Neuropsychological evaluation will be arranged. It is important to work on modifiable risk factors  like DM2, hypertension, hyperlipidemia, and weight. Cognitive challenging exercises recommended as well as a healthful diet and regular physical activity.  Hyperlipidemia, unspecified hyperlipidemia type - Plan: Comprehensive metabolic panel, Lipid panel Currently she is not on pharmacologic treatment. We will arrange labs in about 2 to 3 weeks. Continue low-fat diet for now.  We discussed possible serious and likely etiologies, options for evaluation and workup, limitations of telemedicine visit vs in person visit, treatment, treatment risks and precautions.  I discussed the assessment and treatment plan with the patient. Ms. Frickey was provided an opportunity to ask questions and all were answered. She agreed with the plan and demonstrated an understanding of the instructions.  Return in about 5 months (around 10/29/2021) for Labs in 2-3 weeks..  Jordan Rabanal Martinique, MD

## 2021-06-30 ENCOUNTER — Telehealth: Payer: Self-pay | Admitting: Family Medicine

## 2021-06-30 NOTE — Telephone Encounter (Signed)
Per Dr. Doug Sou notes from patient's last visit, she needs fasting labs done 2-3 weeks from lher 05/29/21 appointment, and then a f/u visit 5 mths from 05/29/21 appt.

## 2021-08-12 ENCOUNTER — Encounter: Payer: Self-pay | Admitting: Family Medicine

## 2021-08-12 NOTE — Progress Notes (Deleted)
NO SHOW

## 2021-10-30 ENCOUNTER — Other Ambulatory Visit: Payer: Self-pay | Admitting: Family Medicine

## 2021-10-30 DIAGNOSIS — I10 Essential (primary) hypertension: Secondary | ICD-10-CM

## 2022-02-08 NOTE — Progress Notes (Signed)
? ?HPI: ?Jordan Ramirez is a 61 y.o. female, who is here today for follow up. ?She was last seen on 05/29/21 for acute virtual visit. ?No new problems since her last visit. ? ?She has gained some wt, has been under a lot of stress and she is an "emotional eater." ?Pitch juice,cakes,cookies,and ice cream among some of her snacks. ?She is her mother's caregiver, who has alzheimer's dementia and getting worse. ? ?Diabetes Mellitus II: Dx'ed in 04/2012. ?- Checking BG at home: Not checking. ?- Medications: Metformin XR 500 mg a.m. and 1000 mg p.m. with meals, Jardiance 10 mg daily. ?- eye exam: Over a year ago. ?- foot exam: Over a year ago. ?- Negative for symptoms of hypoglycemia, polyuria, polydipsia, foot ulcers/trauma ?Left foot and great toe numbness, stable for years. ? ?Lab Results  ?Component Value Date  ? HGBA1C 8.5 (H) 02/13/2020  ? ?Lab Results  ?Component Value Date  ? MICROALBUR 9.7 (H) 02/13/2020  ? ?Hypertension: Currently she is on lisinopril 20 mg daily and amlodipine 5 mg daily. ?She denies checking BP at home. ?Negative for unusual/frequent headache, visual changes, CP, palpitation, focal neurologic deficit, or worsening LE edema. ?Reports some shortness of breath when she is under stress or in pain. ? ?Lab Results  ?Component Value Date  ? CREATININE 0.95 02/13/2020  ? BUN 11 02/13/2020  ? NA 141 02/13/2020  ? K 4.1 02/13/2020  ? CL 104 02/13/2020  ? CO2 30 02/13/2020  ? ?Hyperlipidemia: She is not on pharmacologic treatment. ? ?Lab Results  ?Component Value Date  ? CHOL 235 (H) 02/13/2020  ? HDL 58.60 02/13/2020  ? [place] 150 (H) 02/13/2020  ? LDLDIRECT 118.5 04/17/2012  ? TRIG 128.0 02/13/2020  ? CHOLHDL 4 02/13/2020  ? ?Today she is requesting referral to see Dr. Armed forces technical officer, she is concerned about difficulty with memory. ?Referral to neuropsychology was placed in 05/2021, when she expressed same concern. She could not keep appointment. ?She is forgetting names and sometimes she forgets if  she just did or say something. ?Problem seems to be worse after COVID-19 infection.   ? ?Review of Systems  ?Constitutional:  Positive for fatigue. Negative for activity change, appetite change and fever.  ?HENT:  Negative for mouth sores, nosebleeds and sore throat.   ?Respiratory:  Negative for cough and wheezing.   ?Gastrointestinal:  Negative for abdominal pain, nausea and vomiting.  ?     Negative for changes in bowel habits.  ?Genitourinary:  Negative for decreased urine volume and hematuria.  ?Musculoskeletal:  Positive for arthralgias. Negative for gait problem.  ?Skin:  Negative for rash.  ?Neurological:  Negative for syncope, facial asymmetry and weakness.  ? ?Current Outpatient Medications on File Prior to Visit  ?Medication Sig Dispense Refill  ? albuterol (VENTOLIN HFA) 108 (90 Base) MCG/ACT inhaler TAKE 2 PUFFS BY MOUTH EVERY 6 HOURS AS NEEDED FOR WHEEZE OR SHORTNESS OF BREATH 18 g 1  ? Blood Glucose Monitoring Suppl (ONETOUCH VERIO) w/Device KIT Use to test blood sugars once daily. 1 kit 1  ? furosemide (LASIX) 20 MG tablet Take 1 tablet (20 mg total) by mouth daily as needed. 90 tablet 0  ? glucose blood (ONETOUCH VERIO) test strip Use to test blood sugars once daily. 100 each 12  ? Lancets (ONETOUCH ULTRASOFT) lancets Use to test blood sugars once daily. 100 each 12  ? oxybutynin (DITROPAN-XL) 5 MG 24 hr tablet TAKE 1 TABLET BY MOUTH EVERYDAY AT BEDTIME 90 tablet 0  ? ?No current  facility-administered medications on file prior to visit.  ? ?Past Medical History:  ?Diagnosis Date  ? Arthritis   ? Chronic low back pain   ? Diabetes mellitus without complication ([place])   ? Hypertension   ? Obesity   ? ? ?Past Surgical History:  ?Procedure Laterality Date  ? PILONIDAL CYST EXCISION    ? WISDOM TOOTH EXTRACTION    ? ?No Known Allergies ? ?Family History  ?Problem Relation Age of Onset  ? Coronary artery disease Mother   ? Diabetes type II Mother   ? Depression Mother   ? Hypertension Mother   ? Kidney  failure Father   ? ?Social History  ? ?Socioeconomic History  ? Marital status: Single  ?  Spouse name: Not on file  ? Number of children: Not on file  ? Years of education: Not on file  ? Highest education level: Not on file  ?Occupational History  ? Occupation: Unemployed  ?  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 Smoking status: Never  ? Smokeless tobacco: Never  ?Substance and Sexual Activity  ? Alcohol use: No  ? Drug use: No  ? Sexual activity: Not on file  ?Other Topics Concern  ? Not on file  ?Social History Narrative  ? Not on file  ? ?Social Determinants of Health  ? ?Financial Resource Strain: Not on file  ?Food Insecurity: Not on file  ?Transportation Needs: Not on file  ?Physical Activity: Not on file  ?Stress: Not on file  ?Social Connections: Not on file  ? ?Vitals:  ? 02/09/22 1237  ?BP: 136/90  ?Pulse: (!) 102  ?Resp: 16  ?SpO2: 97%  ? ?Body mass index is 44.07 kg/m?. ? ?Wt Readings from Last 3 Encounters:  ?02/09/22 281 lb 6 oz (127.6 kg)  ?02/13/20 292 lb (132.5 kg)  ?07/11/18 267 lb 8 oz (121.3 kg)  ? ?Physical Exam ?Vitals and nursing note reviewed.  ?Constitutional:   ?   General: She is not in acute distress. ?   Appearance: She is well-developed.  ?HENT:  ?   Head: Normocephalic and atraumatic.  ?   Mouth/Throat:  ?   Mouth: Mucous membranes are moist.  ?   Pharynx: Oropharynx is clear.  ?Eyes:  ?   Conjunctiva/sclera: Conjunctivae normal.  ?Cardiovascular:  ?   Rate and Rhythm: Regular rhythm. Tachycardia present.  ?   Pulses:     ?     Dorsalis pedis pulses are 2+ on the right side and 2+ on the left side.  ?   Heart sounds: No murmur heard. ?Pulmonary:  ?   Effort: Pulmonary effort is normal. No respiratory distress.  ?   Breath sounds: Normal breath sounds.  ?Abdominal:  ?   Palpations: Abdomen is soft. There is no hepatomegaly or mass.  ?   Tenderness: There is no abdominal tenderness.  ?Lymphadenopathy:  ?   Cervical: No cervical  adenopathy.  ?Skin: ?   General: Skin is warm.  ?   Findings: No erythema or rash.  ?Neurological:  ?   General: No focal deficit present.  ?   Mental Status: She is alert and oriented to person, place, and time.  ?   Cranial Nerves: No cranial nerve deficit.  ?   Gait: Gait normal.  ?Psychiatric:  ?   Comments: Well groomed, good eye contact.  ? ?Diabetic Foot Exam - Simple   ?Simple Foot Form ? 02/09/2022 11:08 AM  ?  Visual Inspection ?See comments: Yes ?Sensation Testing ?See comments: Yes ?Pulse Check ?Posterior Tibialis and Dorsalis pulse intact bilaterally: Yes ?Comments ?Calluses and mildly decreased monofilament left. ?  ? ?ASSESSMENT AND PLAN: ? ?Ms. Kinisha Soper was here today annual physical examination. ? ?Orders Placed This Encounter  ?Procedures  ? Comprehensive metabolic panel  ? Lipid panel  ? Microalbumin / creatinine urine ratio  ? Amb Referral to Nutrition and Diabetic Education  ? POC HgB A1c  ? Neuropsychological assessment  ? ?Lab Results  ?Component Value Date  ? HGBA1C 8.4 (A) 02/09/2022  ? ?Lab Results  ?Component Value Date  ? MICROALBUR 5.1 (H) 02/09/2022  ? MICROALBUR 9.7 (H) 02/13/2020  ? ?Lab Results  ?Component Value Date  ? ALT 21 02/09/2022  ? AST 18 02/09/2022  ? ALKPHOS 82 02/09/2022  ? BILITOT 0.7 02/09/2022  ? ?Lab Results  ?Component Value Date  ? CREATININE 0.96 02/09/2022  ? BUN 12 02/09/2022  ? NA 139 02/09/2022  ? K 3.5 02/09/2022  ? CL 103 02/09/2022  ? CO2 27 02/09/2022  ? ?Lab Results  ?Component Value Date  ? CHOL 233 (H) 02/09/2022  ? HDL 59.70 02/09/2022  ? LDLCALC 149 (H) 02/09/2022  ? LDLDIRECT 118.5 04/17/2012  ? TRIG 122.0 02/09/2022  ? CHOLHDL 4 02/09/2022  ? ?Hyperlipidemia, unspecified hyperlipidemia type ?She is not on pharmacologic treatment. ?CV benefits of statins discussed. ?Further recommendations according to FLP results. ? ?Memory difficulties ?Concerned about beginning of dementia. ?Recommend cognitive challenging game , she can download apps on her  phone or Ipad. ?Neuropsychologic evaluation will be arranged and further recommendations will be given according to report. ? ?Diabetes mellitus type 2 with neurological manifestations ([place]) ?Problem is not we

## 2022-02-09 ENCOUNTER — Ambulatory Visit (INDEPENDENT_AMBULATORY_CARE_PROVIDER_SITE_OTHER): Payer: Managed Care, Other (non HMO) | Admitting: Family Medicine

## 2022-02-09 ENCOUNTER — Encounter: Payer: Self-pay | Admitting: Family Medicine

## 2022-02-09 VITALS — BP 136/90 | HR 102 | Resp 16 | Ht 67.0 in | Wt 281.4 lb

## 2022-02-09 DIAGNOSIS — E1149 Type 2 diabetes mellitus with other diabetic neurological complication: Secondary | ICD-10-CM | POA: Diagnosis not present

## 2022-02-09 DIAGNOSIS — R413 Other amnesia: Secondary | ICD-10-CM

## 2022-02-09 DIAGNOSIS — E785 Hyperlipidemia, unspecified: Secondary | ICD-10-CM | POA: Diagnosis not present

## 2022-02-09 DIAGNOSIS — Z0000 Encounter for general adult medical examination without abnormal findings: Secondary | ICD-10-CM

## 2022-02-09 DIAGNOSIS — I10 Essential (primary) hypertension: Secondary | ICD-10-CM | POA: Diagnosis not present

## 2022-02-09 LAB — LIPID PANEL
Cholesterol: 233 mg/dL — ABNORMAL HIGH (ref 0–200)
HDL: 59.7 mg/dL (ref >39.00)
LDL Cholesterol: 149 mg/dL — ABNORMAL HIGH (ref 0–99)
NonHDL: 173.25
Total CHOL/HDL Ratio: 4
Triglycerides: 122 mg/dL (ref 0.0–149.0)
VLDL: 24.4 mg/dL (ref 0.0–40.0)

## 2022-02-09 LAB — COMPREHENSIVE METABOLIC PANEL
ALT: 21 U/L (ref 0–35)
AST: 18 U/L (ref 0–37)
Albumin: 4.2 g/dL (ref 3.5–5.2)
Alkaline Phosphatase: 82 U/L (ref 39–117)
BUN: 12 mg/dL (ref 6–23)
CO2: 27 mEq/L (ref 19–32)
Calcium: 9.5 mg/dL (ref 8.4–10.5)
Chloride: 103 mEq/L (ref 96–112)
Creatinine, Ser: 0.96 mg/dL (ref 0.40–1.20)
GFR: 63.62 mL/min (ref >60.00)
Glucose, Bld: 162 mg/dL — ABNORMAL HIGH (ref 70–99)
Potassium: 3.5 mEq/L (ref 3.5–5.1)
Sodium: 139 mEq/L (ref 135–145)
Total Bilirubin: 0.7 mg/dL (ref 0.2–1.2)
Total Protein: 7.7 g/dL (ref 6.0–8.3)

## 2022-02-09 LAB — POCT GLYCOSYLATED HEMOGLOBIN (HGB A1C): HbA1c, POC (controlled diabetic range): 8.4 % — AB (ref 0.0–7.0)

## 2022-02-09 MED ORDER — EMPAGLIFLOZIN 10 MG PO TABS: 10.0000 mg | ORAL_TABLET | Freq: Every day | ORAL | 1 refills | Status: DC

## 2022-02-09 MED ORDER — LISINOPRIL 20 MG PO TABS: 20.0000 mg | ORAL_TABLET | Freq: Every day | ORAL | 2 refills | Status: DC

## 2022-02-09 MED ORDER — TRULICITY 0.75 MG/0.5ML ~~LOC~~ SOAJ: 0.7500 mg | SUBCUTANEOUS | 1 refills | Status: DC

## 2022-02-09 MED ORDER — METFORMIN HCL ER 500 MG PO TB24: ORAL_TABLET | ORAL | 1 refills | Status: DC

## 2022-02-09 MED ORDER — AMLODIPINE BESYLATE 10 MG PO TABS: 10.0000 mg | ORAL_TABLET | Freq: Every day | ORAL | 2 refills | Status: DC

## 2022-02-09 NOTE — Assessment & Plan Note (Signed)
Problem is not well controlled, A1c went from 8.5-8.4. ?Continue Jardiance 10 mg daily and metformin XR 500 mg in the morning with breakfast and 1000 mg at night with supper. ?We discussed possible complications of elevated glucose. ?Appointment with nutrition/diabetic educator will be arranged. ?Annual eye exam, periodic dental and foot care recommended. ?F/U in 3-4 months ? ?

## 2022-02-09 NOTE — Assessment & Plan Note (Signed)
We discussed benefits of wt loss as well as adverse effects of obesity. ?Consistency with healthy diet and physical activity recommended. ?Low impact exercise like walking in place a few times per week is a good option. ?Stopping peach juice and increasing water intake and decreasing sugar added foods encouraged. ?

## 2022-02-09 NOTE — Patient Instructions (Addendum)
A few things to remember from today's visit: ? ? ?Essential hypertension - Plan: Comprehensive metabolic panel, amLODipine (NORVASC) 10 MG tablet, lisinopril (ZESTRIL) 20 MG tablet ? ?Diabetes mellitus type 2 with neurological manifestations ([place]) - Plan: POC HgB A1c, Amb Referral to Nutrition and Diabetic Education, Comprehensive metabolic panel, Microalbumin / creatinine urine ratio ? ?Hyperlipidemia, unspecified hyperlipidemia type - Plan: Comprehensive metabolic panel, Lipid panel ? ?Memory difficulties - Plan: Neuropsychological assessment ? ?If you need refills please call your pharmacy. ?Do not use My Chart to request refills or for acute issues that need immediate attention. ?  ?Trulicity 6.96 mg added today, weekly injection. ?No changes in Metformin. ?Jardiance 10 mg daily to be resumed. ?Amlodipine increased from 5 mg to 10 mg. ?Rest unchanged. ? ? ?Please be sure medication list is accurate. ?If a new problem present, please set up appointment sooner than planned today. ?Diabetes Mellitus and Nutrition, Adult ?When you have diabetes, or diabetes mellitus, it is very important to have healthy eating habits because your blood sugar (glucose) levels are greatly affected by what you eat and drink. Eating healthy foods in the right amounts, at about the same times every day, can help you: ?Manage your blood glucose. ?Lower your risk of heart disease. ?Improve your blood pressure. ?Reach or maintain a healthy weight. ?What can affect my meal plan? ?Every person with diabetes is different, and each person has different needs for a meal plan. Your health care provider may recommend that you work with a dietitian to make a meal plan that is best for you. Your meal plan may vary depending on factors such as: ?The calories you need. ?The medicines you take. ?Your weight. ?Your blood glucose, blood pressure, and cholesterol levels. ?Your activity level. ?Other health conditions you have, such as heart or kidney  disease. ?How do carbohydrates affect me? ?Carbohydrates, also called carbs, affect your blood glucose level more than any other type of food. Eating carbs raises the amount of glucose in your blood. ?It is important to know how many carbs you can safely have in each meal. This is different for every person. Your dietitian can help you calculate how many carbs you should have at each meal and for each snack. ?How does alcohol affect me? ?Alcohol can cause a decrease in blood glucose (hypoglycemia), especially if you use insulin or take certain diabetes medicines by mouth. Hypoglycemia can be a life-threatening condition. Symptoms of hypoglycemia, such as sleepiness, dizziness, and confusion, are similar to symptoms of having too much alcohol. ?Do not drink alcohol if: ?Your health care provider tells you not to drink. ?You are pregnant, may be pregnant, or are planning to become pregnant. ?If you drink alcohol: ?Limit how much you have to: ?0-1 drink a day for women. ?0-2 drinks a day for men. ?Know how much alcohol is in your drink. In the [place] one drink equals one 12 oz bottle of beer (355 mL), one 5 oz glass of wine (148 mL), or one 1? oz glass of hard liquor (44 mL). ?Keep yourself hydrated with water, diet soda, or unsweetened iced tea. Keep in mind that regular soda, juice, and other mixers may contain a lot of sugar and must be counted as carbs. ?What are tips for following this plan? ? ?Reading food labels ?Start by checking the serving size on the Nutrition Facts label of packaged foods and drinks. The number of calories and the amount of carbs, fats, and other nutrients listed on the label are based  on one serving of the item. Many items contain more than one serving per package. ?Check the total grams (g) of carbs in one serving. ?Check the number of grams of saturated fats and trans fats in one serving. Choose foods that have a low amount or none of these fats. ?Check the number of milligrams (mg) of  salt (sodium) in one serving. Most people should limit total sodium intake to less than 2,300 mg per day. ?Always check the nutrition information of foods labeled as "low-fat" or "nonfat." These foods may be higher in added sugar or refined carbs and should be avoided. ?Talk to your dietitian to identify your daily goals for nutrients listed on the label. ?Shopping ?Avoid buying canned, pre-made, or processed foods. These foods tend to be high in fat, sodium, and added sugar. ?Shop around the outside edge of the grocery store. This is where you will most often find fresh fruits and vegetables, bulk grains, fresh meats, and fresh dairy products. ?Cooking ?Use low-heat cooking methods, such as baking, instead of high-heat cooking methods, such as deep frying. ?Cook using healthy oils, such as olive, canola, or sunflower oil. ?Avoid cooking with butter, cream, or high-fat meats. ?Meal planning ?Eat meals and snacks regularly, preferably at the same times every day. Avoid going long periods of time without eating. ?Eat foods that are high in fiber, such as fresh fruits, vegetables, beans, and whole grains. ?Eat 4-6 oz (112-168 g) of lean protein each day, such as lean meat, chicken, fish, eggs, or tofu. One ounce (oz) (28 g) of lean protein is equal to: ?1 oz (28 g) of meat, chicken, or fish. ?1 egg. ?? cup (62 g) of tofu. ?Eat some foods each day that contain healthy fats, such as avocado, nuts, seeds, and fish. ?What foods should I eat? ?Fruits ?Berries. Apples. Oranges. Peaches. Apricots. Plums. Grapes. Mangoes. Papayas. Pomegranates. Kiwi. Cherries. ?Vegetables ?Leafy greens, including lettuce, spinach, kale, chard, collard greens, mustard greens, and cabbage. Beets. Cauliflower. Broccoli. Carrots. Green beans. Tomatoes. Peppers. Onions. Cucumbers. Brussels sprouts. ?Grains ?Whole grains, such as whole-wheat or whole-grain bread, crackers, tortillas, cereal, and pasta. Unsweetened oatmeal. Quinoa. Brown or wild  rice. ?Meats and other proteins ?Seafood. Poultry without skin. Lean cuts of poultry and beef. Tofu. Nuts. Seeds. ?Dairy ?Low-fat or fat-free dairy products such as milk, yogurt, and cheese. ?The items listed above may not be a complete list of foods and beverages you can eat and drink. Contact a dietitian for more information. ?What foods should I avoid? ?Fruits ?Fruits canned with syrup. ?Vegetables ?Canned vegetables. Frozen vegetables with butter or cream sauce. ?Grains ?Refined white flour and flour products such as bread, pasta, snack foods, and cereals. Avoid all processed foods. ?Meats and other proteins ?Fatty cuts of meat. Poultry with skin. Breaded or fried meats. Processed meat. Avoid saturated fats. ?Dairy ?Full-fat yogurt, cheese, or milk. ?Beverages ?Sweetened drinks, such as soda or iced tea. ?The items listed above may not be a complete list of foods and beverages you should avoid. Contact a dietitian for more information. ?Questions to ask a health care provider ?Do I need to meet with a certified diabetes care and education specialist? ?Do I need to meet with a dietitian? ?What number can I call if I have questions? ?When are the best times to check my blood glucose? ?Where to find more information: ?American Diabetes Association: diabetes.org ?Academy of Nutrition and Dietetics: eatright.org ?[employer] of Diabetes and Digestive and Kidney Diseases: AmenCredit.is ?Association of Diabetes Care &  Education Specialists: diabeteseducator.org ?Summary ?It is important to have healthy eating habits because your blood sugar (glucose) levels are greatly affected by what you eat and drink. It is important to use alcohol carefully. ?A healthy meal plan will help you manage your blood glucose and lower your risk of heart disease. ?Your health care provider may recommend that you work with a dietitian to make a meal plan that is best for you. ?This information is not intended to replace advice given  to you by your health care provider. Make sure you discuss any questions you have with your health care provider. ?Document Revised: 05/07/2020 Document Reviewed: 05/07/2020 ?Elsevier Patient Education ? 2023 E

## 2022-02-09 NOTE — Assessment & Plan Note (Signed)
BP re-checked x 2: 160/90. ?BP is not well controlled. ?Possible complications of elevated BP discussed. ?Changes today: Amlodipine dose increased from 5 mg to 10 mg.  Continue lisinopril 20 mg daily. ?Low salt diet. ?Monitor BP at home. ?Instructed about warning signs. ?Follow-up in 3 to 4 months, before if needed.. ? ?

## 2022-02-10 LAB — MICROALBUMIN / CREATININE URINE RATIO
Creatinine,U: 250 mg/dL
Microalb Creat Ratio: 2 mg/g (ref 0.0–30.0)
Microalb, Ur: 5.1 mg/dL — ABNORMAL HIGH (ref 0.0–1.9)

## 2022-03-09 ENCOUNTER — Encounter: Payer: Commercial Managed Care - HMO | Attending: Family Medicine | Admitting: Nutrition

## 2022-03-09 DIAGNOSIS — E1149 Type 2 diabetes mellitus with other diabetic neurological complication: Secondary | ICD-10-CM | POA: Insufficient documentation

## 2022-03-10 NOTE — Patient Instructions (Addendum)
Stop drinking sweet drinks, fruit juices and eating sweats.  Begin a walking program and work up to 30-40 minutes 4-5 days/wk. Test blood sugar before and 2hr. After one meal each day.  Goals:  before meals: less than 110, and 2 hours after: less than 160. Call if questions Return in 4 weeks

## 2022-03-10 NOTE — Progress Notes (Addendum)
Patient is here today to loose weight.  She reports that she wants to take better care of her diabetes and to not have to take insulin. Medications.  Taking Meformin (forgets 2-3X/wk), but not take Trulicity-does not want to take any medication.   Exercise:  none.  Says is very sedentary, but takes care of mother and father with [employer] SBGM:  does not test regularly.  Has not tested in several days.  Machine is old and would like a new one.  She was shown the Ox[place] 3 sensor, but did not want anything on her sking.  Diet: Typical day: 10AM -up drinks water, 12PM:    Discussion:  The idea of insulin resisitance and ways to reduce this with weight loss, exercise and lowering blood sugar readings. Need to test blood sugars to prevent high readings that will increase her insulin resistance resulting in more stress to the pancrease and possibility of needing insulin in next few years.  Need to balance meals, with 3 basic food groups, and foods found in each group.  Also need to limit amounts of each food group in every meal.  Serving sizes given. She was given a one Touch verio meter Lot: [id number] X  Exp. 7/27, with extra lancets and test strips.  Blood sugar acB was 223.  Pt. Very alarmed about this.  Discussed goals: ac: less than 100 and 2hr. Pc: less than 160. Food suggestions:  Stop all sweet drinks, fruit juices, and cold cereal and milk Exercise:  examples given on how she can make small changes, like parking farther from stores, or walking around the house X2 to get fresh air and clear her mind.   Need to put herself and her needs first.  She was encouraged to tell herself each day that she is the most important person to take care of today.   Patient did not want a meal plan, but we reviewed need for all meals to have protein, carbohydrates and small amounts of fats.  We reviewed again what was in each food group and she had no final questions.

## 2022-03-12 ENCOUNTER — Telehealth: Payer: Self-pay | Admitting: Family Medicine

## 2022-03-12 MED ORDER — ROSUVASTATIN CALCIUM 10 MG PO TABS: 10.0000 mg | ORAL_TABLET | Freq: Every day | ORAL | 3 refills | Status: DC

## 2022-03-12 NOTE — Telephone Encounter (Signed)
Pt is calling and would like to get new rx crestor 10 mg send to  [id number]- [place], [place] - [id number] Phone:  [phone] Fax:  [phone]   This is medication md recommended

## 2022-03-12 NOTE — Telephone Encounter (Signed)
Rx sent in as requested. 

## 2022-04-06 ENCOUNTER — Ambulatory Visit: Payer: Commercial Managed Care - HMO | Admitting: Nutrition

## 2022-04-10 ENCOUNTER — Emergency Department (HOSPITAL_COMMUNITY)
Admission: EM | Admit: 2022-04-10 | Discharge: 2022-04-10 | Disposition: A | Discharge status: Home / Self Care | Payer: Commercial Managed Care - HMO | Attending: Emergency Medicine | Admitting: Emergency Medicine

## 2022-04-10 ENCOUNTER — Emergency Department (HOSPITAL_COMMUNITY): Payer: Commercial Managed Care - HMO

## 2022-04-10 ENCOUNTER — Other Ambulatory Visit: Payer: Self-pay

## 2022-04-10 DIAGNOSIS — N3000 Acute cystitis without hematuria: Secondary | ICD-10-CM | POA: Insufficient documentation

## 2022-04-10 DIAGNOSIS — R109 Unspecified abdominal pain: Secondary | ICD-10-CM | POA: Diagnosis present

## 2022-04-10 DIAGNOSIS — I10 Essential (primary) hypertension: Secondary | ICD-10-CM | POA: Insufficient documentation

## 2022-04-10 DIAGNOSIS — Z7984 Long term (current) use of oral hypoglycemic drugs: Secondary | ICD-10-CM | POA: Insufficient documentation

## 2022-04-10 DIAGNOSIS — Z79899 Other long term (current) drug therapy: Secondary | ICD-10-CM | POA: Diagnosis not present

## 2022-04-10 DIAGNOSIS — E119 Type 2 diabetes mellitus without complications: Secondary | ICD-10-CM | POA: Diagnosis not present

## 2022-04-10 DIAGNOSIS — Z794 Long term (current) use of insulin: Secondary | ICD-10-CM | POA: Insufficient documentation

## 2022-04-10 LAB — CBC WITH DIFFERENTIAL/PLATELET
Abs Immature Granulocytes: 0.02 10*3/uL (ref 0.00–0.07)
Basophils Absolute: 0 10*3/uL (ref 0.0–0.1)
Basophils Relative: 0 %
Eosinophils Absolute: 0 10*3/uL (ref 0.0–0.5)
Eosinophils Relative: 0 %
HCT: 42.4 % (ref 36.0–46.0)
Hemoglobin: 14.5 g/dL (ref 12.0–15.0)
Immature Granulocytes: 0 %
Lymphocytes Relative: 20 %
Lymphs Abs: 1.7 10*3/uL (ref 0.7–4.0)
MCH: 30.3 pg (ref 26.0–34.0)
MCHC: 34.2 g/dL (ref 30.0–36.0)
MCV: 88.7 fL (ref 80.0–100.0)
Monocytes Absolute: 0.4 10*3/uL (ref 0.1–1.0)
Monocytes Relative: 4 %
Neutro Abs: 6.2 10*3/uL (ref 1.7–7.7)
Neutrophils Relative %: 76 %
Platelets: 184 10*3/uL (ref 150–400)
RBC: 4.78 MIL/uL (ref 3.87–5.11)
RDW: 13.2 % (ref 11.5–15.5)
WBC: 8.3 10*3/uL (ref 4.0–10.5)
nRBC: 0 % (ref 0.0–0.2)

## 2022-04-10 LAB — URINALYSIS, ROUTINE W REFLEX MICROSCOPIC
Bacteria, UA: NONE SEEN
Bilirubin Urine: NEGATIVE
Glucose, UA: >=500 mg/dL — AB
Hgb urine dipstick: NEGATIVE
Ketones, ur: 5 mg/dL — AB
Nitrite: NEGATIVE
Protein, ur: NEGATIVE mg/dL
Specific Gravity, Urine: 1.014 (ref 1.005–1.030)
pH: 6 (ref 5.0–8.0)

## 2022-04-10 LAB — COMPREHENSIVE METABOLIC PANEL
ALT: 24 U/L (ref 0–44)
AST: 23 U/L (ref 15–41)
Albumin: 4.2 g/dL (ref 3.5–5.0)
Alkaline Phosphatase: 62 U/L (ref 38–126)
Anion gap: 8 (ref 5–15)
BUN: 17 mg/dL (ref 8–23)
CO2: 23 mmol/L (ref 22–32)
Calcium: 9 mg/dL (ref 8.9–10.3)
Chloride: 108 mmol/L (ref 98–111)
Creatinine, Ser: 1.06 mg/dL — ABNORMAL HIGH (ref 0.44–1.00)
GFR, Estimated: 59 mL/min — ABNORMAL LOW (ref >60)
Glucose, Bld: 184 mg/dL — ABNORMAL HIGH (ref 70–99)
Potassium: 3.3 mmol/L — ABNORMAL LOW (ref 3.5–5.1)
Sodium: 139 mmol/L (ref 135–145)
Total Bilirubin: 0.7 mg/dL (ref 0.3–1.2)
Total Protein: 7.8 g/dL (ref 6.5–8.1)

## 2022-04-10 LAB — LIPASE, BLOOD: Lipase: 39 U/L (ref 11–51)

## 2022-04-10 MED ORDER — ONDANSETRON HCL 4 MG/2ML IJ SOLN
4.0000 mg | Freq: Once | INTRAMUSCULAR | Status: AC
Start: 2022-04-10 — End: 2022-04-10
  Administered 2022-04-10: 4 mg via INTRAVENOUS
  Filled 2022-04-10: qty 2

## 2022-04-10 MED ORDER — HYDROCODONE-ACETAMINOPHEN 5-325 MG PO TABS: 1.0000 | ORAL_TABLET | Freq: Four times a day (QID) | ORAL | 0 refills | Status: DC | PRN

## 2022-04-10 MED ORDER — CEPHALEXIN 500 MG PO CAPS
500.0000 mg | ORAL_CAPSULE | Freq: Four times a day (QID) | ORAL | 0 refills | Status: DC
Start: 2022-04-10 — End: 2022-05-25

## 2022-04-10 MED ORDER — HYDROMORPHONE HCL 1 MG/ML IJ SOLN
1.0000 mg | Freq: Once | INTRAMUSCULAR | Status: AC
  Administered 2022-04-10: 1 mg via INTRAVENOUS
  Filled 2022-04-10: qty 1

## 2022-04-10 MED ORDER — LIDOCAINE 5 % EX PTCH: 1.0000 | MEDICATED_PATCH | CUTANEOUS | 0 refills | Status: DC

## 2022-04-10 MED ORDER — CEPHALEXIN 500 MG PO CAPS
500.0000 mg | ORAL_CAPSULE | Freq: Once | ORAL | Status: AC
  Administered 2022-04-10: 500 mg via ORAL
  Filled 2022-04-10: qty 1

## 2022-04-10 MED ORDER — CYCLOBENZAPRINE HCL 10 MG PO TABS: 10.0000 mg | ORAL_TABLET | Freq: Two times a day (BID) | ORAL | 0 refills | Status: DC | PRN

## 2022-04-11 LAB — URINE CULTURE: Culture: <10000 — AB

## 2022-05-24 NOTE — Progress Notes (Unsigned)
Chief Complaint  Patient presents with   Hospitalization Follow-up   HPI: [name] is a 61 y.o. female, who is here today to follow on recent ED visit. Evaluated in the ED on 04/10/2022 due to severe right flank/abdominal pain, nausea, and vomiting. Symptoms started after eating mediterranean food. She did have diarrhea but rather constipation.  CT renal stone study: Negative for kidney stones or other acute process. Uterine fibroid. Symptoms have resolved. She has not noted blood in the stool or melena. She has not had a colonoscopy.  BP was elevated at 169/91. She is currently on amlodipine 10 mg daily and lisinopril 20 mg daily. Negative for severe/frequent headache, visual changes, chest pain, dyspnea, palpitation,or focal weakness. She has had some LE edema after traveling recently, it has improved.  Lab Results  Component Value Date   CREATININE 1.06 (H) 04/10/2022   BUN 17 04/10/2022   NA 139 04/10/2022   K 3.3 (L) 04/10/2022   CL 108 04/10/2022   CO2 23 04/10/2022   Lab Results  Component Value Date   WBC 8.3 04/10/2022   HGB 14.5 04/10/2022   HCT 42.4 04/10/2022   MCV 88.7 04/10/2022   PLT 184 04/10/2022   Dx'ed with UTI, completed treatment with cephalexin. Ucx:<10,000 COLONIES/mL INSIGNIFICANT GROWTH   Lab Results  Component Value Date   ALT 24 04/10/2022   AST 23 04/10/2022   ALKPHOS 62 04/10/2022   BILITOT 0.7 04/10/2022   Lab Results  Component Value Date   LIPASE 39 04/10/2022  Abdominal pain has resolved  DM II:Dx'ed in 04/2012. She did not take Jardiance due to cost. Currently she is on Trulicity 8.33 milligrams weekly and metformin XR 500 mg 1 tablet a.m. and 2 tablets p.m. BS most < 200, occasionally low 200's. She has met with nutritionist and try to do better with following dietary recommendations. She decreased sodas and ice cream intake. "Little" increased in physical activity. She has noticed some weight loss.  Left great  toe numbness. She has had problems since 2019. She has seen neurologist.  Lab Results  Component Value Date   HGBA1C 8.4 (A) 02/09/2022   Lab Results  Component Value Date   MICROALBUR 5.1 (H) 02/09/2022   MICROALBUR 9.7 (H) 02/13/2020   -Sometimes upper back pain and seldom retrosternal CP when in bed or sitting, when turning in bed or with certain movement. Negative for burping. Occasional heartburn. Symptoms are not exacerbated by exertion. No associated cough or wheezing.  -Lower back pain, she would like to have PT arranged. In the past she has received epidural injections. This time pain is not radiated to lower extremities. Negative for saddle anesthesia or bowel/bladder dysfunction. No history of trauma.  Review of Systems  Constitutional:  Negative for activity change, appetite change and fever.  HENT:  Negative for mouth sores, nosebleeds, sore throat and trouble swallowing.   Respiratory:  Negative for chest tightness.   Gastrointestinal:  Negative for abdominal pain, nausea and vomiting.  Endocrine: Negative for cold intolerance and heat intolerance.  Genitourinary:  Negative for decreased urine volume, dysuria and hematuria.  Musculoskeletal:  Positive for arthralgias and back pain.  Skin:  Negative for rash.  Neurological:  Positive for numbness. Negative for syncope, facial asymmetry and weakness.  Rest see pertinent positives and negatives per HPI.  Current Outpatient Medications on File Prior to Visit  Medication Sig Dispense Refill   amLODipine (NORVASC) 10 MG tablet Take 1 tablet (10 mg total) by mouth daily. [place]  tablet 2   Blood Glucose Monitoring Suppl (ONETOUCH VERIO) w/Device KIT Use to test blood sugars once daily. 1 kit 1   Cholecalciferol (VITAMIN D3 PO) Take 1 tablet by mouth daily.     furosemide (LASIX) 20 MG tablet Take 1 tablet (20 mg total) by mouth daily as needed. (Patient taking differently: Take 20 mg by mouth daily as needed for edema or  fluid.) 90 tablet 0   glucose blood (ONETOUCH VERIO) test strip Use to test blood sugars once daily. 100 each 12   HYDROcodone-acetaminophen (NORCO/VICODIN) 5-325 MG tablet Take 1 tablet by mouth every 6 (six) hours as needed. 12 tablet 0   Lancets (ONETOUCH ULTRASOFT) lancets Use to test blood sugars once daily. 100 each 12   lidocaine (LIDODERM) 5 % Place 1 patch onto the skin daily. Remove & Discard patch within 12 hours or as directed by MD 30 patch 0   lisinopril (ZESTRIL) 20 MG tablet Take 1 tablet (20 mg total) by mouth daily. 90 tablet 2   metFORMIN (GLUCOPHAGE-XR) 500 MG 24 hr tablet Take 1500 mg by mouth daily (500 mg with breakfast and 1000 mg with supper) (Patient taking differently: Take 500 mg by mouth daily. Take 1500 mg by mouth daily (500 mg with breakfast and 1000 mg with supper)) 270 tablet 1   Multiple Vitamins-Minerals (ZINC PO) Take 1 tablet by mouth daily.     rosuvastatin (CRESTOR) 10 MG tablet Take 1 tablet (10 mg total) by mouth daily. 90 tablet 3   No current facility-administered medications on file prior to visit.    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, international aid  Tobacco Use   Smoking status: Never   Smokeless tobacco: Never  Substance and Sexual Activity   Alcohol use: No   Drug use: No   Sexual activity: Not on file  Other Topics Concern   Not on file  Social History Narrative   Not on file   Social Determinants of Health   Financial Resource Strain: Not on file  Food Insecurity: Not on file   Transportation Needs: Not on file  Physical Activity: Not on file  Stress: Not on file  Social Connections: Not on file   Vitals:   05/25/22 1358  BP: 138/88  Pulse: 100  Resp: 12  SpO2: 97%   Wt Readings from Last 3 Encounters:  05/25/22 274 lb 6 oz (124.5 kg)  04/10/22 280 lb 3.3 oz (127.1 kg)  02/09/22 281 lb 6 oz (127.6 kg)   Body mass index is 42.97 kg/m.  Physical Exam Vitals and nursing note reviewed.  Constitutional:      General: She is not in acute distress.    Appearance: She is well-developed.  HENT:     Head: Normocephalic and atraumatic.     Mouth/Throat:     Mouth: Mucous membranes are moist.     Pharynx: Oropharynx is clear.  Eyes:     Conjunctiva/sclera: Conjunctivae  normal.  Cardiovascular:     Rate and Rhythm: Normal rate and regular rhythm.     Pulses:          Dorsalis pedis pulses are 2+ on the right side and 2+ on the left side.     Heart sounds: No murmur heard. Pulmonary:     Effort: Pulmonary effort is normal. No respiratory distress.     Breath sounds: Normal breath sounds.  Abdominal:     Palpations: Abdomen is soft. There is no hepatomegaly or mass.     Tenderness: There is no abdominal tenderness.  Musculoskeletal:     Cervical back: No tenderness or bony tenderness.     Thoracic back: No tenderness or bony tenderness.     Lumbar back: No tenderness or bony tenderness. Negative right straight leg raise test and negative left straight leg raise test.     Right lower leg: No edema.     Left lower leg: No edema.  Lymphadenopathy:     Cervical: No cervical adenopathy.  Skin:    General: Skin is warm.     Findings: No erythema or rash.  Neurological:     General: No focal deficit present.     Mental Status: She is alert and oriented to person, place, and time.     Cranial Nerves: No cranial nerve deficit.     Gait: Gait normal.  Psychiatric:     Comments: Well groomed, good eye contact.    Diabetic Foot Exam - Simple   Simple  Foot Form Diabetic Foot exam was performed with the following findings: Yes 05/25/2022  2:44 PM  Visual Inspection No deformities, no ulcerations, no other skin breakdown bilaterally: Yes Sensation Testing See comments: Yes Pulse Check Posterior Tibialis and Dorsalis pulse intact bilaterally: Yes Comments Decreased monofilament left great toe. Normal vibration.    ASSESSMENT AND PLAN:  [name] was seen today for hospitalization follow-up.  Diagnoses and all orders for this visit: Orders Placed This Encounter  Procedures   DG Chest 2 View   Basic metabolic panel   Ambulatory referral to Gastroenterology   Ambulatory referral to Physical Therapy   POC HgB A1c   Lab Results  Component Value Date   HGBA1C 7.3 (A) 05/25/2022   Lab Results  Component Value Date   CREATININE 1.01 05/25/2022   BUN 13 05/25/2022   NA 140 05/25/2022   K 3.5 05/25/2022   CL 103 05/25/2022   CO2 28 05/25/2022   Hypokalemia Mild. K+ rich diet recommended for now. No changes in Furosemide. Further recommendations according to BMP.  Chest pain at rest With upper back pain. Hx and examination do not suggest a serious process, musculoskeletal and GI causes among some to consider. CXR ordered today. If persistent we could consider cardiology referral.  Diabetes mellitus type 2 with neurological manifestations ([place]) HgA1C is not at goal but improved, it went from 8.4 to 7.3. Trulicity increased from 0.75 mg weekly to 1.5 mg weekly. Continue metformin XR 1 tablet in the morning with breakfast and 2 tablets with supper. Annual eye exam, periodic dental and foot care recommended. F/U in 4 months  Essential hypertension BP adequately controlled. Continue amlodipine 10 mg daily and lisinopril 20 mg daily. Continue monitoring BP regularly. Low-salt/DASH diet to continue.  Peripheral polyneuropathy Most likely related to DM II. We discussed the importance of appropriate footcare and better  glucose control.   Chronic bilateral low back pain I do not think imaging is needed at this  time. In the past she has seen orthopedist and she has received epidural injections, no symptoms suggestive of radiculopathy at this time. She is interested in trying PT, referral placed.  Colon cancer screening -     Ambulatory referral to Gastroenterology  I spent a total of 53 minutes in both face to face and non face to face activities for this visit on the date of this encounter. During this time history was obtained and documented, examination was performed, prior labs/imaging reviewed, and assessment/plan discussed.  Return in about 4 months (around 09/24/2022).  Jordan Ramirez G. Martinique, MD  [facility]. [place] office.

## 2022-05-25 ENCOUNTER — Ambulatory Visit (INDEPENDENT_AMBULATORY_CARE_PROVIDER_SITE_OTHER): Payer: Commercial Managed Care - HMO

## 2022-05-25 ENCOUNTER — Ambulatory Visit (INDEPENDENT_AMBULATORY_CARE_PROVIDER_SITE_OTHER): Payer: Commercial Managed Care - HMO | Admitting: Family Medicine

## 2022-05-25 ENCOUNTER — Encounter: Payer: Self-pay | Admitting: Family Medicine

## 2022-05-25 VITALS — BP 138/88 | HR 100 | Resp 12 | Ht 67.0 in | Wt 274.4 lb

## 2022-05-25 DIAGNOSIS — Z1211 Encounter for screening for malignant neoplasm of colon: Secondary | ICD-10-CM

## 2022-05-25 DIAGNOSIS — E876 Hypokalemia: Secondary | ICD-10-CM

## 2022-05-25 DIAGNOSIS — I10 Essential (primary) hypertension: Secondary | ICD-10-CM | POA: Diagnosis not present

## 2022-05-25 DIAGNOSIS — R079 Chest pain, unspecified: Secondary | ICD-10-CM

## 2022-05-25 DIAGNOSIS — E1149 Type 2 diabetes mellitus with other diabetic neurological complication: Secondary | ICD-10-CM | POA: Diagnosis not present

## 2022-05-25 DIAGNOSIS — G629 Polyneuropathy, unspecified: Secondary | ICD-10-CM

## 2022-05-25 DIAGNOSIS — G8929 Other chronic pain: Secondary | ICD-10-CM

## 2022-05-25 DIAGNOSIS — M545 Low back pain, unspecified: Secondary | ICD-10-CM

## 2022-05-25 LAB — POCT GLYCOSYLATED HEMOGLOBIN (HGB A1C): Hemoglobin A1C: 7.3 % — AB (ref 4.0–5.6)

## 2022-05-25 LAB — BASIC METABOLIC PANEL
BUN: 13 mg/dL (ref 6–23)
CO2: 28 mEq/L (ref 19–32)
Calcium: 9.4 mg/dL (ref 8.4–10.5)
Chloride: 103 mEq/L (ref 96–112)
Creatinine, Ser: 1.01 mg/dL (ref 0.40–1.20)
GFR: 59.74 mL/min — ABNORMAL LOW (ref >60.00)
Glucose, Bld: 158 mg/dL — ABNORMAL HIGH (ref 70–99)
Potassium: 3.5 mEq/L (ref 3.5–5.1)
Sodium: 140 mEq/L (ref 135–145)

## 2022-05-25 MED ORDER — TRULICITY 1.5 MG/0.5ML ~~LOC~~ SOAJ: 1.5000 mg | SUBCUTANEOUS | 2 refills | Status: DC

## 2022-05-25 NOTE — Assessment & Plan Note (Signed)
I do not think imaging is needed at this time. In the past she has seen orthopedist and she has received epidural injections, no symptoms suggestive of radiculopathy at this time. She is interested in trying PT, referral placed.

## 2022-05-25 NOTE — Assessment & Plan Note (Signed)
Most likely related to DM II. We discussed the importance of appropriate footcare and better glucose control.

## 2022-05-25 NOTE — Patient Instructions (Addendum)
A few things to remember from today's visit:  Essential hypertension - Plan: Basic metabolic panel  Diabetes mellitus type 2 with neurological manifestations ([place]) - Plan: POC HgB A1c, Dulaglutide (TRULICITY) 1.5 JO/[id number] SOPN  Colon cancer screening - Plan: Ambulatory referral to Gastroenterology  Hypokalemia - Plan: Basic metabolic panel  Chest pain at rest - Plan: DG Chest 2 View  Chronic bilateral low back pain, unspecified whether sciatica present - Plan: Ambulatory referral to Physical Therapy  If you need refills please call your pharmacy. Do not use My Chart to request refills or for acute issues that need immediate attention.   Chest and back pain seems musculoskeletal.  Please be sure medication list is accurate. If a new problem present, please set up appointment sooner than planned today.  Trulicity increased from 0.75 mg weekly to 1.5 mg weekly. Rest no changes. Please arrange an eye exam.

## 2022-05-25 NOTE — Assessment & Plan Note (Signed)
BP adequately controlled. Continue amlodipine 10 mg daily and lisinopril 20 mg daily. Continue monitoring BP regularly. Low-salt/DASH diet to continue.

## 2022-05-25 NOTE — Assessment & Plan Note (Signed)
HgA1C is not at goal but improved, it went from 8.4 to 7.3. Trulicity increased from 0.75 mg weekly to 1.5 mg weekly. Continue metformin XR 1 tablet in the morning with breakfast and 2 tablets with supper. Annual eye exam, periodic dental and foot care recommended. F/U in 4 months

## 2022-06-09 ENCOUNTER — Telehealth: Payer: Self-pay | Admitting: Family Medicine

## 2022-06-09 ENCOUNTER — Ambulatory Visit: Payer: Commercial Managed Care - HMO | Admitting: Physical Therapy

## 2022-06-09 NOTE — Telephone Encounter (Signed)
FYI

## 2022-06-09 NOTE — Telephone Encounter (Signed)
Wanted to make provider aware she has not been able to make contact with the patient.

## 2022-06-16 NOTE — Therapy (Signed)
OUTPATIENT PHYSICAL THERAPY THORACOLUMBAR EVALUATION   Patient Name: Jordan Ramirez MRN: [id number] DOB:Oct 28, 1960, 61 y.o., female Today's Date: 06/17/2022   PT End of Session - 06/17/22 1453     Visit Number 1    Number of Visits 17    Date for PT Re-Evaluation 08/12/22    Authorization Type [employer]    PT Start Time 1400    PT Stop Time 1450    PT Time Calculation (min) 50 min    Activity Tolerance Patient tolerated treatment well    Behavior During Therapy WFL for tasks assessed/performed             Past Medical History:  Diagnosis Date   Arthritis    Chronic low back pain    Diabetes mellitus without complication ([place])    Hypertension    Obesity    Past Surgical History:  Procedure Laterality Date   PILONIDAL CYST EXCISION     WISDOM TOOTH EXTRACTION     Patient Active Problem List   Diagnosis Date Noted   Peripheral polyneuropathy 05/25/2022   Chronic bilateral low back pain 05/25/2022   Mixed incontinence urge and stress 02/13/2020   Bilateral lower extremity edema 02/13/2020   Ganglion cyst of dorsum of right wrist 01/16/2018   Bilateral knee pain 04/17/2012   Pain of left heel 04/17/2012   Diabetes mellitus type 2 with neurological manifestations ([place]) 08/28/2009   THUMB PAIN, LEFT 08/27/2009   Hyperlipemia 08/20/2009   Morbid obesity ([place]) 08/20/2009   ANEMIA-NOS 08/20/2009   Essential hypertension 08/20/2009   ALLERGIC RHINITIS 08/20/2009   ASTHMA 08/20/2009   MENORRHAGIA, PERIMENOPAUSAL 08/20/2009   DEGENERATIVE JOINT DISEASE, KNEE 08/20/2009   TENDINITIS, LEFT THUMB 08/20/2009    PCP: Martinique, Betty G, MD  REFERRING PROVIDER: Martinique, Betty G, MD  REFERRING DIAG: M54.50,G89.29 (ICD-10-CM) - Chronic bilateral low back pain, unspecified whether sciatica present  Rationale for Evaluation and Treatment Rehabilitation  THERAPY DIAG:  Other low back pain - Plan: PT plan of care cert/re-cert  Chronic pain of right knee - Plan: PT plan of care  cert/re-cert  Chronic pain of left knee - Plan: PT plan of care cert/re-cert  Muscle weakness (generalized) - Plan: PT plan of care cert/re-cert  Other abnormalities of gait and mobility - Plan: PT plan of care cert/re-cert  ONSET DATE: Chronic  SUBJECTIVE:                                                                                                                                                                                           SUBJECTIVE STATEMENT: Pt presents to PT with reports of of chronic LBP  that started in July 2019. She had referral of symptoms and N/T into L LE at that time, but that symptom has since resolved. Has had good past success with physical therapy at [facility], did stop going there when she strained her back at the last session. 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 Denies bowel/bladder changes or saddle anesthesia.   PERTINENT HISTORY:  HTN, DMII  PAIN:  Are you having pain?  No: NPRS scale: 0/10 (6/10) Pain location: lower back Pain description: achy, tired Aggravating factors: prolonged standing Relieving factors: rest   Are you having pain?  Yes: NPRS scale: 0/10 (6/10) Pain location: bilateral knees Pain description: sharp Aggravating factors: stairs Relieving factors: rest, ice  PRECAUTIONS: None  WEIGHT BEARING RESTRICTIONS No  FALLS:  Has patient fallen in last 6 months? 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  PLOF: Independent and Independent with basic ADLs  PATIENT GOALS: pt wants to decrease lower back and knee pain in order to get more active with travel and community activities    OBJECTIVE:   DIAGNOSTIC FINDINGS:  See imaging   PATIENT SURVEYS:  FOTO: 43%  function; 56% predicted  COGNITION:  Overall cognitive status: Within functional limits for tasks assessed   SENSATION: WFL  MUSCLE LENGTH: Thomas test: Right (-); Left (+)  POSTURE: rounded shoulders and increased lumbar lordosis and larger body habitus  PALPATION: TTP to lumbar paraspinals  LUMBAR ROM:   Active  A/PROM  eval  Flexion WFL  Extension 50% limited with p!  Right lateral flexion   Left lateral flexion   Right rotation 50% limited with p!  Left rotation 50% limited with p!   (Blank rows = not tested)  LOWER EXTREMITY ROM:     Active  Right eval Left eval  Hip flexion    Hip extension    Hip abduction    Hip adduction    Hip internal rotation    Hip external rotation    Knee flexion 87 100  Knee extension    Ankle dorsiflexion    Ankle plantarflexion    Ankle inversion    Ankle eversion     (Blank rows = not tested)  LOWER EXTREMITY MMT:    MMT Right eval Left eval  Hip flexion 4/5 4/5  Hip extension    Hip abduction 4/5 4/5  Hip adduction 4/5 3+/5 p!  Hip internal rotation    Hip external rotation    Knee flexion 5/5 5/5  Knee extension 5/5 5/5  Ankle dorsiflexion    Ankle plantarflexion    Ankle inversion    Ankle eversion     (Blank rows = not tested)  LUMBAR SPECIAL TESTS:  Straight leg raise test: Negative and Slump test: Negative  FUNCTIONAL TESTS:  30 Second Sit to Stand: 7 reps  GAIT: Distance walked: 12f Assistive device utilized: None Level of assistance: Complete Independence Comments: decreased gait speed, trunk flexed    TODAY'S TREATMENT  [facility] Adult PT Treatment:  DATE: 06/17/2022 Therapeutic Exercise: Row BTB x 10 Supine PPT x 5 - 5" hold SLR x 5 each Modified thomas stretch x 30" L Bridge x 5  PATIENT EDUCATION:  Education details: eval findings, FOTO, HEP, POC Person educated: Patient Education method: Explanation, Demonstration, and Handouts Education  comprehension: verbalized understanding and returned demonstration   HOME EXERCISE PROGRAM: Access Code: [email or web address] URL: [email or web address] Date: 06/17/2022 Prepared by: Octavio Manns  Exercises - Standing Shoulder Row with Anchored Resistance  - 1 x daily - 7 x weekly - 3 sets - 10 reps - Supine Posterior Pelvic Tilt  - 1 x daily - 7 x weekly - 3 sets - 10 reps - 5 hold - Active Straight Leg Raise with Quad Set  - 1 x daily - 7 x weekly - 2-3 sets - 10 reps - Modified Thomas Stretch  - 1 x daily - 7 x weekly - 2 sets - 30 sec hold - Supine Bridge  - 1 x daily - 7 x weekly - 2-3 sets - 10 reps  ASSESSMENT:  CLINICAL IMPRESSION: Patient is a 61 y.o. F who was seen today for physical therapy evaluation and treatment for chronic lower back and bilateral knee pain. Physical findings are consistent with MD impression as pt demonstrates lumbar ROM deficits as well as core and proximal hip weakness. Her FOTO score indicates decrease in functional ability below PLOF. She would benefit from skilled PT services working on improving core and proximal hip strength in order to decrease pain and improve function. Will progress as tolerated per POC.    OBJECTIVE IMPAIRMENTS decreased activity tolerance, decreased balance, decreased endurance, decreased mobility, difficulty walking, decreased ROM, decreased strength, postural dysfunction, and pain   ACTIVITY LIMITATIONS lifting, standing, squatting, stairs, and transfers  PARTICIPATION LIMITATIONS: community activity and occupation  [place], Time since onset of injury/illness/exacerbation, and 1-2 comorbidities: HTN, DMII  are also affecting patient's functional outcome.   REHAB POTENTIAL: Excellent  CLINICAL DECISION MAKING: Stable/uncomplicated  EVALUATION COMPLEXITY: Low   GOALS: Goals reviewed with patient? No  SHORT TERM GOALS: Target date: 07/08/2022  Pt will be compliant and knowledgeable with initial  HEP for improved comfort and carryover Baseline: initial HEP given  Goal status: INITIAL  2.  Pt will self report lower back and knee pain no greater than 4/10 for improved comfort and functional ability Baseline: 6/10 at worst Goal status: INITIAL   LONG TERM GOALS: Target date: 08/12/2022  Pt will self report lower back and knee pain no greater than 1-2/10 for improved comfort and functional ability Baseline: 6/10 at worst Goal status: INITIAL   2.  Pt will improve FOTO function score to no less than 43% as proxy for functional improvement Baseline: 56% function Goal status: INITIAL   3.  Pt will increase 30 Second Sit to Stand rep count to no less than 10 reps for improved balance, strength, and functional mobility Baseline: 7 reps  Goal status: INITIAL   4.  Pt will improve bilateral knee flexion ROM to no less than 110 deg for improved comfort and functional mobility with home and community activities Baseline: see chart Goal status: INITIAL  PLAN: PT FREQUENCY: 2x/week  PT DURATION: 8 weeks  PLANNED INTERVENTIONS: Therapeutic exercises, Therapeutic activity, Neuromuscular re-education, Balance training, Gait training, Patient/Family education, Self Care, Joint mobilization, Aquatic Therapy, Dry Needling, Electrical stimulation, Cryotherapy, Moist heat, Vasopneumatic device, Manual therapy, and Re-evaluation.  PLAN FOR NEXT SESSION: assess HEP response, core and proximal hip weakness,  Ward Chatters, PT 06/17/2022, 3:21 PM

## 2022-06-17 ENCOUNTER — Ambulatory Visit: Payer: Commercial Managed Care - HMO | Admitting: Physical Therapy

## 2022-06-17 ENCOUNTER — Ambulatory Visit: Payer: Commercial Managed Care - HMO | Attending: Family Medicine

## 2022-06-17 DIAGNOSIS — M6281 Muscle weakness (generalized): Secondary | ICD-10-CM | POA: Insufficient documentation

## 2022-06-17 DIAGNOSIS — G8929 Other chronic pain: Secondary | ICD-10-CM | POA: Insufficient documentation

## 2022-06-17 DIAGNOSIS — M545 Low back pain, unspecified: Secondary | ICD-10-CM | POA: Diagnosis not present

## 2022-06-17 DIAGNOSIS — M25561 Pain in right knee: Secondary | ICD-10-CM | POA: Diagnosis present

## 2022-06-17 DIAGNOSIS — R2689 Other abnormalities of gait and mobility: Secondary | ICD-10-CM | POA: Insufficient documentation

## 2022-06-17 DIAGNOSIS — M5459 Other low back pain: Secondary | ICD-10-CM | POA: Insufficient documentation

## 2022-06-17 DIAGNOSIS — M25562 Pain in left knee: Secondary | ICD-10-CM | POA: Diagnosis present

## 2022-06-29 ENCOUNTER — Ambulatory Visit: Payer: Commercial Managed Care - HMO | Attending: Family Medicine

## 2022-06-29 DIAGNOSIS — M25562 Pain in left knee: Secondary | ICD-10-CM | POA: Diagnosis present

## 2022-06-29 DIAGNOSIS — R2689 Other abnormalities of gait and mobility: Secondary | ICD-10-CM | POA: Insufficient documentation

## 2022-06-29 DIAGNOSIS — G8929 Other chronic pain: Secondary | ICD-10-CM | POA: Diagnosis present

## 2022-06-29 DIAGNOSIS — M25561 Pain in right knee: Secondary | ICD-10-CM | POA: Insufficient documentation

## 2022-06-29 DIAGNOSIS — M5459 Other low back pain: Secondary | ICD-10-CM | POA: Diagnosis present

## 2022-06-29 DIAGNOSIS — M6281 Muscle weakness (generalized): Secondary | ICD-10-CM | POA: Insufficient documentation

## 2022-06-29 NOTE — Therapy (Signed)
OUTPATIENT PHYSICAL THERAPY TREATMENT NOTE   Patient Name: Jordan Ramirez MRN: [id number] DOB:[birth year], 61 y.o., female Today's Date: 06/29/2022  PCP: Martinique, Betty G, MD REFERRING PROVIDER: Martinique, Betty G, MD  END OF SESSION:   PT End of Session - 06/29/22 1003     Visit Number 2    Number of Visits 17    Date for PT Re-Evaluation 08/12/22    Authorization Type [employer]    PT Start Time 1003    PT Stop Time 1042    PT Time Calculation (min) 39 min    Activity Tolerance Patient tolerated treatment well    Behavior During Therapy WFL for tasks assessed/performed             Past Medical History:  Diagnosis Date   Arthritis    Chronic low back pain    Diabetes mellitus without complication ([place])    Hypertension    Obesity    Past Surgical History:  Procedure Laterality Date   PILONIDAL CYST EXCISION     WISDOM TOOTH EXTRACTION     Patient Active Problem List   Diagnosis Date Noted   Peripheral polyneuropathy 05/25/2022   Chronic bilateral low back pain 05/25/2022   Mixed incontinence urge and stress 02/13/2020   Bilateral lower extremity edema 02/13/2020   Ganglion cyst of dorsum of right wrist 01/16/2018   Bilateral knee pain 04/17/2012   Pain of left heel 04/17/2012   Diabetes mellitus type 2 with neurological manifestations ([place]) 08/28/2009   THUMB PAIN, LEFT 08/27/2009   Hyperlipemia 08/20/2009   Morbid obesity ([place]) 08/20/2009   ANEMIA-NOS 08/20/2009   Essential hypertension 08/20/2009   ALLERGIC RHINITIS 08/20/2009   ASTHMA 08/20/2009   MENORRHAGIA, PERIMENOPAUSAL 08/20/2009   DEGENERATIVE JOINT DISEASE, KNEE 08/20/2009   TENDINITIS, LEFT THUMB 08/20/2009    REFERRING DIAG: M54.50,G89.29 (ICD-10-CM) - Chronic bilateral low back pain, unspecified whether sciatica present  THERAPY DIAG:  Other low back pain  Chronic pain of right knee  Chronic pain of left knee  Muscle weakness (generalized)  Other abnormalities of gait and  mobility  Rationale for Evaluation and Treatment Rehabilitation  PERTINENT HISTORY: HTN, DMII  PRECAUTIONS: None  SUBJECTIVE: Pt presents to PT with reports of bilateral knee pain, denies back pain. Pt has been fairly compliant with HEP with no adverse effect. Pt is ready to begin PT at this time.  PAIN:  Are you having pain?  No: NPRS scale: 0/10 (6/10) Pain location: lower back Pain description: achy, tired Aggravating factors: prolonged standing Relieving factors: rest            Are you having pain?  Yes: NPRS scale: 2/10 (6/10) Pain location: bilateral knees Pain description: sharp Aggravating factors: stairs Relieving factors: rest, ice   OBJECTIVE: (objective measures completed at initial evaluation unless otherwise dated)  DIAGNOSTIC FINDINGS:  See imaging    PATIENT SURVEYS:  FOTO: 43% function; 56% predicted   COGNITION:           Overall cognitive status: Within functional limits for tasks assessed               SENSATION: WFL   MUSCLE LENGTH: Thomas test: Right (-); Left (+)   POSTURE: rounded shoulders and increased lumbar lordosis and larger body habitus   PALPATION: TTP to lumbar paraspinals   LUMBAR ROM:    Active  A/PROM  eval  Flexion WFL  Extension 50% limited with p!  Right lateral flexion    Left lateral flexion  Right rotation 50% limited with p!  Left rotation 50% limited with p!   (Blank rows = not tested)   LOWER EXTREMITY ROM:      Active  Right eval Left eval  Hip flexion      Hip extension      Hip abduction      Hip adduction      Hip internal rotation      Hip external rotation      Knee flexion 87 100  Knee extension      Ankle dorsiflexion      Ankle plantarflexion      Ankle inversion      Ankle eversion       (Blank rows = not tested)   LOWER EXTREMITY MMT:     MMT Right eval Left eval  Hip flexion 4/5 4/5  Hip extension      Hip abduction 4/5 4/5  Hip adduction 4/5 3+/5 p!  Hip internal  rotation      Hip external rotation      Knee flexion 5/5 5/5  Knee extension 5/5 5/5  Ankle dorsiflexion      Ankle plantarflexion      Ankle inversion      Ankle eversion       (Blank rows = not tested)   LUMBAR SPECIAL TESTS:  Straight leg raise test: Negative and Slump test: Negative   FUNCTIONAL TESTS:  30 Second Sit to Stand: 7 reps   GAIT: Distance walked: 72f Assistive device utilized: None Level of assistance: Complete Independence Comments: decreased gait speed, trunk flexed       TODAY'S TREATMENT  [facility] Adult PT Treatment:                                                DATE: 06/29/2022 Therapeutic Exercise: Supine SLR 2x10 each Quad sets with towels under heels 2x10 - 5" hold S/L clamshell 2x10 GTB STS 2x10 - no UE support Seated knee ext 2x10 10# Seated hamstring curl 2x10 35# Step ups 2x10 fwd 8in Supine PPT x 10 - 5" hold Supine PPT with ball 2x10 - 5" hold  [facility] Adult PT Treatment:                                                DATE: 06/17/2022 Therapeutic Exercise: Row BTB x 10 Supine PPT x 5 - 5" hold SLR x 5 each Modified thomas stretch x 30" L Bridge x 5   PATIENT EDUCATION:  Education details: HEP update Person educated: Patient Education method: Explanation, DMedia planner and Handouts Education comprehension: verbalized understanding and returned demonstration     HOME EXERCISE PROGRAM: Access Code: [email or web address]: [email or web address] Date: 06/29/2022 Prepared by: DOctavio Manns Exercises - Standing Shoulder Row with Anchored Resistance  - 1 x daily - 7 x weekly - 3 sets - 10 reps - Supine Posterior Pelvic Tilt  - 1 x daily - 7 x weekly - 3 sets - 10 reps - 5 hold - Active Straight Leg Raise with Quad Set  - 1 x daily - 7 x weekly - 2-3 sets - 10 reps - Modified Thomas Stretch  - 1 x daily - 7 x weekly - 2  sets - 30 sec hold - Supine Bridge  - 1 x daily - 7 x weekly - 2-3 sets - 10 reps - Clamshell with Resistance  - 1  x daily - 7 x weekly - 2 sets - 10 reps - Step Up  - 1 x daily - 7 x weekly - 2 sets - 10 reps   ASSESSMENT:   CLINICAL IMPRESSION: Pt was able to complete all prescribed exercises with no adverse effect or increase in pain. Therapy focused on improving core and LE strength in order to decrease pain and improve functional ability. Will continue to progress as tolerated per POC.      OBJECTIVE IMPAIRMENTS decreased activity tolerance, decreased balance, decreased endurance, decreased mobility, difficulty walking, decreased ROM, decreased strength, postural dysfunction, and pain    ACTIVITY LIMITATIONS lifting, standing, squatting, stairs, and transfers   PARTICIPATION LIMITATIONS: community activity and occupation   [place], Time since onset of injury/illness/exacerbation, and 1-2 comorbidities: HTN, DMII  are also affecting patient's functional outcome.    REHAB POTENTIAL: Excellent   CLINICAL DECISION MAKING: Stable/uncomplicated   EVALUATION COMPLEXITY: Low     GOALS: Goals reviewed with patient? No   SHORT TERM GOALS: Target date: 07/08/2022   Pt will be compliant and knowledgeable with initial HEP for improved comfort and carryover Baseline: initial HEP given  Goal status: INITIAL   2.  Pt will self report lower back and knee pain no greater than 4/10 for improved comfort and functional ability Baseline: 6/10 at worst Goal status: INITIAL    LONG TERM GOALS: Target date: 08/12/2022   Pt will self report lower back and knee pain no greater than 1-2/10 for improved comfort and functional ability Baseline: 6/10 at worst Goal status: INITIAL    2.  Pt will improve FOTO function score to no less than 43% as proxy for functional improvement Baseline: 56% function Goal status: INITIAL    3.  Pt will increase 30 Second Sit to Stand rep count to no less than 10 reps for improved balance, strength, and functional mobility Baseline: 7 reps  Goal status:  INITIAL    4.  Pt will improve bilateral knee flexion ROM to no less than 110 deg for improved comfort and functional mobility with home and community activities Baseline: see chart Goal status: INITIAL   PLAN: PT FREQUENCY: 2x/week   PT DURATION: 8 weeks   PLANNED INTERVENTIONS: Therapeutic exercises, Therapeutic activity, Neuromuscular re-education, Balance training, Gait training, Patient/Family education, Self Care, Joint mobilization, Aquatic Therapy, Dry Needling, Electrical stimulation, Cryotherapy, Moist heat, Vasopneumatic device, Manual therapy, and Re-evaluation.   PLAN FOR NEXT SESSION: assess HEP response, core and proximal hip weakness,     Ward Chatters, PT 06/29/2022, 10:43 AM

## 2022-07-01 ENCOUNTER — Ambulatory Visit: Payer: Commercial Managed Care - HMO

## 2022-07-01 DIAGNOSIS — G8929 Other chronic pain: Secondary | ICD-10-CM

## 2022-07-01 DIAGNOSIS — R2689 Other abnormalities of gait and mobility: Secondary | ICD-10-CM

## 2022-07-01 DIAGNOSIS — M5459 Other low back pain: Secondary | ICD-10-CM

## 2022-07-01 DIAGNOSIS — M6281 Muscle weakness (generalized): Secondary | ICD-10-CM

## 2022-07-01 NOTE — Therapy (Signed)
OUTPATIENT PHYSICAL THERAPY TREATMENT NOTE   Patient Name: Jordan Ramirez MRN: [id number] DOB:06/17/1961, 61 y.o., female Today's Date: 07/01/2022  PCP: [place]ique, Betty G, MD REFERRING PROVIDER: [place], Betty G, MD  END OF SESSION:   PT End of Session - 07/01/22 1132     Visit Number 3    Number of Visits 17    Date for PT Re-Evaluation 08/12/22    Authorization Type [employer]    PT Start Time 1131    PT Stop Time 1210    PT Time Calculation (min) 39 min    Activity Tolerance Patient tolerated treatment well    Behavior During Therapy WFL for tasks assessed/performed             Past Medical History:  Diagnosis Date   Arthritis    Chronic low back pain    Diabetes mellitus without complication ([place])    Hypertension    Obesity    Past Surgical History:  Procedure Laterality Date   PILONIDAL CYST EXCISION     WISDOM TOOTH EXTRACTION     Patient Active Problem List   Diagnosis Date Noted   Peripheral polyneuropathy 05/25/2022   Chronic bilateral low back pain 05/25/2022   Mixed incontinence urge and stress 02/13/2020   Bilateral lower extremity edema 02/13/2020   Ganglion cyst of dorsum of right wrist 01/16/2018   Bilateral knee pain 04/17/2012   Pain of left heel 04/17/2012   Diabetes mellitus type 2 with neurological manifestations ([place]) 08/28/2009   THUMB PAIN, LEFT 08/27/2009   Hyperlipemia 08/20/2009   Morbid obesity ([place]) 08/20/2009   ANEMIA-NOS 08/20/2009   Essential hypertension 08/20/2009   ALLERGIC RHINITIS 08/20/2009   ASTHMA 08/20/2009   MENORRHAGIA, PERIMENOPAUSAL 08/20/2009   DEGENERATIVE JOINT DISEASE, KNEE 08/20/2009   TENDINITIS, LEFT THUMB 08/20/2009    REFERRING DIAG: M54.50,G89.29 (ICD-10-CM) - Chronic bilateral low back pain, unspecified whether sciatica present  THERAPY DIAG:  Other low back pain  Chronic pain of right knee  Chronic pain of left knee  Muscle weakness (generalized)  Other abnormalities of gait and  mobility  Rationale for Evaluation and Treatment Rehabilitation  PERTINENT HISTORY: HTN, DMII  PRECAUTIONS: None  SUBJECTIVE: Patient reports that BIL knees are hurting today but that her lower back isn't in any pain today.   PAIN:  Are you having pain?  No: NPRS scale: 0/10 (6/10) Pain location: lower back Pain description: achy, tired Aggravating factors: prolonged standing Relieving factors: rest            Are you having pain?  Yes: NPRS scale: 5/10 (6/10) Pain location: bilateral knees Pain description: sharp Aggravating factors: stairs Relieving factors: rest, ice   OBJECTIVE: (objective measures completed at initial evaluation unless otherwise dated)  DIAGNOSTIC FINDINGS:  See imaging    PATIENT SURVEYS:  FOTO: 43% function; 56% predicted   COGNITION:           Overall cognitive status: Within functional limits for tasks assessed               SENSATION: WFL   MUSCLE LENGTH: Thomas test: Right (-); Left (+)   POSTURE: rounded shoulders and increased lumbar lordosis and larger body habitus   PALPATION: TTP to lumbar paraspinals   LUMBAR ROM:    Active  A/PROM  eval  Flexion WFL  Extension 50% limited with p!  Right lateral flexion    Left lateral flexion    Right rotation 50% limited with p!  Left rotation 50% limited with p!   (  Blank rows = not tested)   LOWER EXTREMITY ROM:      Active  Right eval Left eval  Hip flexion      Hip extension      Hip abduction      Hip adduction      Hip internal rotation      Hip external rotation      Knee flexion 87 100  Knee extension      Ankle dorsiflexion      Ankle plantarflexion      Ankle inversion      Ankle eversion       (Blank rows = not tested)   LOWER EXTREMITY MMT:     MMT Right eval Left eval  Hip flexion 4/5 4/5  Hip extension      Hip abduction 4/5 4/5  Hip adduction 4/5 3+/5 p!  Hip internal rotation      Hip external rotation      Knee flexion 5/5 5/5  Knee extension  5/5 5/5  Ankle dorsiflexion      Ankle plantarflexion      Ankle inversion      Ankle eversion       (Blank rows = not tested)   LUMBAR SPECIAL TESTS:  Straight leg raise test: Negative and Slump test: Negative   FUNCTIONAL TESTS:  30 Second Sit to Stand: 7 reps   GAIT: Distance walked: 6f Assistive device utilized: None Level of assistance: Complete Independence Comments: decreased gait speed, trunk flexed       TODAY'S TREATMENT  [facility] Adult PT Treatment:                                                DATE: 07/01/2022 Therapeutic Exercise: Nustep level 5 x 5 mins Step ups 2x10 fwd 8in BIL leading Seated knee ext 2x10 10# Seated hamstring curl 2x10 35# Quad sets with towels under heels 2x10 - 5" hold STS 2x10 - no UE support Supine PPT with ball 2x10 - 5" hold Bridges 2x10  [facility] Adult PT Treatment:                                                DATE: 06/29/2022 Therapeutic Exercise: Supine SLR 2x10 each Quad sets with towels under heels 2x10 - 5" hold S/L clamshell 2x10 GTB STS 2x10 - no UE support Seated knee ext 2x10 10# Seated hamstring curl 2x10 35# Step ups 2x10 fwd 8in Supine PPT x 10 - 5" hold Supine PPT with ball 2x10 - 5" hold  [facility] Adult PT Treatment:                                                DATE: 06/17/2022 Therapeutic Exercise: Row BTB x 10 Supine PPT x 5 - 5" hold SLR x 5 each Modified thomas stretch x 30" L Bridge x 5   PATIENT EDUCATION:  Education details: HEP update Person educated: Patient Education method: Explanation, DMedia planner and Handouts Education comprehension: verbalized understanding and returned demonstration     HOME EXERCISE PROGRAM: Access Code: [email or web address]: [email or web address] Date:  06/29/2022 Prepared by: Octavio Manns  Exercises - Standing Shoulder Row with Anchored Resistance  - 1 x daily - 7 x weekly - 3 sets - 10 reps - Supine Posterior Pelvic Tilt  - 1 x daily - 7 x weekly - 3 sets - 10  reps - 5 hold - Active Straight Leg Raise with Quad Set  - 1 x daily - 7 x weekly - 2-3 sets - 10 reps - Modified Thomas Stretch  - 1 x daily - 7 x weekly - 2 sets - 30 sec hold - Supine Bridge  - 1 x daily - 7 x weekly - 2-3 sets - 10 reps - Clamshell with Resistance  - 1 x daily - 7 x weekly - 2 sets - 10 reps - Step Up  - 1 x daily - 7 x weekly - 2 sets - 10 reps   ASSESSMENT:   CLINICAL IMPRESSION: Patient presents to PT with no current back pain and reports pain in BIL knees, L>R and daily HEP compliance. Session today continued to focus on proximal hip, BIL LE, and core strengthening. Patient was able to tolerate all prescribed exercises with no adverse effects. Patient continues to benefit from skilled PT services and should be progressed as able to improve functional independence.    OBJECTIVE IMPAIRMENTS decreased activity tolerance, decreased balance, decreased endurance, decreased mobility, difficulty walking, decreased ROM, decreased strength, postural dysfunction, and pain    ACTIVITY LIMITATIONS lifting, standing, squatting, stairs, and transfers   PARTICIPATION LIMITATIONS: community activity and occupation   [place], Time since onset of injury/illness/exacerbation, and 1-2 comorbidities: HTN, DMII  are also affecting patient's functional outcome.    REHAB POTENTIAL: Excellent   CLINICAL DECISION MAKING: Stable/uncomplicated   EVALUATION COMPLEXITY: Low     GOALS: Goals reviewed with patient? No   SHORT TERM GOALS: Target date: 07/08/2022   Pt will be compliant and knowledgeable with initial HEP for improved comfort and carryover Baseline: initial HEP given  Goal status: INITIAL   2.  Pt will self report lower back and knee pain no greater than 4/10 for improved comfort and functional ability Baseline: 6/10 at worst Goal status: INITIAL    LONG TERM GOALS: Target date: 08/12/2022   Pt will self report lower back and knee pain no greater than  1-2/10 for improved comfort and functional ability Baseline: 6/10 at worst Goal status: INITIAL    2.  Pt will improve FOTO function score to no less than 43% as proxy for functional improvement Baseline: 56% function Goal status: INITIAL    3.  Pt will increase 30 Second Sit to Stand rep count to no less than 10 reps for improved balance, strength, and functional mobility Baseline: 7 reps  Goal status: INITIAL    4.  Pt will improve bilateral knee flexion ROM to no less than 110 deg for improved comfort and functional mobility with home and community activities Baseline: see chart Goal status: INITIAL   PLAN: PT FREQUENCY: 2x/week   PT DURATION: 8 weeks   PLANNED INTERVENTIONS: Therapeutic exercises, Therapeutic activity, Neuromuscular re-education, Balance training, Gait training, Patient/Family education, Self Care, Joint mobilization, Aquatic Therapy, Dry Needling, Electrical stimulation, Cryotherapy, Moist heat, Vasopneumatic device, Manual therapy, and Re-evaluation.   PLAN FOR NEXT SESSION: assess HEP response, core and proximal hip weakness,     Margarette Canada, PTA 07/01/2022, 12:12 PM

## 2022-07-06 ENCOUNTER — Ambulatory Visit: Payer: Commercial Managed Care - HMO

## 2022-07-06 DIAGNOSIS — M5459 Other low back pain: Secondary | ICD-10-CM | POA: Diagnosis not present

## 2022-07-06 DIAGNOSIS — G8929 Other chronic pain: Secondary | ICD-10-CM

## 2022-07-06 NOTE — Therapy (Signed)
OUTPATIENT PHYSICAL THERAPY TREATMENT NOTE   Patient Name: Jordan Ramirez MRN: [id number] DOB:October 08, 1961, 61 y.o., female Today's Date: 07/06/2022  PCP: Martinique, Betty G, MD REFERRING PROVIDER: Martinique, Betty G, MD  END OF SESSION:   PT End of Session - 07/06/22 1143     Visit Number 4    Number of Visits 17    Date for PT Re-Evaluation 08/12/22    Authorization Type [employer]    PT Start Time 1143    PT Stop Time 1218    PT Time Calculation (min) 35 min    Activity Tolerance Patient tolerated treatment well    Behavior During Therapy WFL for tasks assessed/performed              Past Medical History:  Diagnosis Date   Arthritis    Chronic low back pain    Diabetes mellitus without complication ([place])    Hypertension    Obesity    Past Surgical History:  Procedure Laterality Date   PILONIDAL CYST EXCISION     WISDOM TOOTH EXTRACTION     Patient Active Problem List   Diagnosis Date Noted   Peripheral polyneuropathy 05/25/2022   Chronic bilateral low back pain 05/25/2022   Mixed incontinence urge and stress 02/13/2020   Bilateral lower extremity edema 02/13/2020   Ganglion cyst of dorsum of right wrist 01/16/2018   Bilateral knee pain 04/17/2012   Pain of left heel 04/17/2012   Diabetes mellitus type 2 with neurological manifestations ([place]) 08/28/2009   THUMB PAIN, LEFT 08/27/2009   Hyperlipemia 08/20/2009   Morbid obesity ([place]) 08/20/2009   ANEMIA-NOS 08/20/2009   Essential hypertension 08/20/2009   ALLERGIC RHINITIS 08/20/2009   ASTHMA 08/20/2009   MENORRHAGIA, PERIMENOPAUSAL 08/20/2009   DEGENERATIVE JOINT DISEASE, KNEE 08/20/2009   TENDINITIS, LEFT THUMB 08/20/2009    REFERRING DIAG: M54.50,G89.29 (ICD-10-CM) - Chronic bilateral low back pain, unspecified whether sciatica present  THERAPY DIAG:  Other low back pain  Chronic pain of right knee  Rationale for Evaluation and Treatment Rehabilitation  PERTINENT HISTORY: HTN, DMII  PRECAUTIONS:  None  SUBJECTIVE: Pt presents to PT with no reports of lower back pain, does have R knee pain. Has been compliant with HEP with no adverse effect. Pt is ready to begin PT at this time.   PAIN:  Are you having pain?  No: NPRS scale: 0/10 (6/10) Pain location: lower back Pain description: achy, tired Aggravating factors: prolonged standing Relieving factors: rest            Are you having pain?  Yes: NPRS scale: 5/10 (6/10) Pain location: bilateral knees Pain description: sharp Aggravating factors: stairs Relieving factors: rest, ice   OBJECTIVE: (objective measures completed at initial evaluation unless otherwise dated)  DIAGNOSTIC FINDINGS:  See imaging    PATIENT SURVEYS:  FOTO: 43% function; 56% predicted   COGNITION:           Overall cognitive status: Within functional limits for tasks assessed               SENSATION: WFL   MUSCLE LENGTH: Thomas test: Right (-); Left (+)   POSTURE: rounded shoulders and increased lumbar lordosis and larger body habitus   PALPATION: TTP to lumbar paraspinals   LUMBAR ROM:    Active  A/PROM  eval  Flexion WFL  Extension 50% limited with p!  Right lateral flexion    Left lateral flexion    Right rotation 50% limited with p!  Left rotation 50% limited with p!   (  Blank rows = not tested)   LOWER EXTREMITY ROM:      Active  Right eval Left eval  Hip flexion      Hip extension      Hip abduction      Hip adduction      Hip internal rotation      Hip external rotation      Knee flexion 87 100  Knee extension      Ankle dorsiflexion      Ankle plantarflexion      Ankle inversion      Ankle eversion       (Blank rows = not tested)   LOWER EXTREMITY MMT:     MMT Right eval Left eval  Hip flexion 4/5 4/5  Hip extension      Hip abduction 4/5 4/5  Hip adduction 4/5 3+/5 p!  Hip internal rotation      Hip external rotation      Knee flexion 5/5 5/5  Knee extension 5/5 5/5  Ankle dorsiflexion      Ankle  plantarflexion      Ankle inversion      Ankle eversion       (Blank rows = not tested)   LUMBAR SPECIAL TESTS:  Straight leg raise test: Negative and Slump test: Negative   FUNCTIONAL TESTS:  30 Second Sit to Stand: 7 reps   GAIT: Distance walked: 79f Assistive device utilized: None Level of assistance: Complete Independence Comments: decreased gait speed, trunk flexed       TODAY'S TREATMENT  [facility] Adult PT Treatment:                                                DATE: 07/06/2022 Therapeutic Exercise: Seated knee ext 2x15 10# Seated knee flex 2x15 35# Standing hip abd/ext 2x10 25# STS 2x10 - no UE support - high table Supine PPT x 10 - 5" hold  Supine PPT with ball 2x10 - 5" hold Supine PPT with BTB pulldown x 10 Supine SLR 2x10 Seated hamstring stretch 2x30" each Bridges 2x10   PATIENT EDUCATION:  Education details: HEP update Person educated: Patient Education method: Explanation, Demonstration, and Handouts Education comprehension: verbalized understanding and returned demonstration     HOME EXERCISE PROGRAM: Access Code: [email or web address]: [email or web address] Date: 06/29/2022 Prepared by: DOctavio Manns Exercises - Standing Shoulder Row with Anchored Resistance  - 1 x daily - 7 x weekly - 3 sets - 10 reps - Supine Posterior Pelvic Tilt  - 1 x daily - 7 x weekly - 3 sets - 10 reps - 5 hold - Active Straight Leg Raise with Quad Set  - 1 x daily - 7 x weekly - 2-3 sets - 10 reps - Modified Thomas Stretch  - 1 x daily - 7 x weekly - 2 sets - 30 sec hold - Supine Bridge  - 1 x daily - 7 x weekly - 2-3 sets - 10 reps - Clamshell with Resistance  - 1 x daily - 7 x weekly - 2 sets - 10 reps - Step Up  - 1 x daily - 7 x weekly - 2 sets - 10 reps   ASSESSMENT:   CLINICAL IMPRESSION: Pt was able to complete all prescribed exercises with no adverse effect or increase in pain. Therapy focused on improving core and proximal hip strength  in order to decrease  pain and improve functional mobility. Pt is progressing with therapy and will continue to be seen and progressed as able.    OBJECTIVE IMPAIRMENTS decreased activity tolerance, decreased balance, decreased endurance, decreased mobility, difficulty walking, decreased ROM, decreased strength, postural dysfunction, and pain    ACTIVITY LIMITATIONS lifting, standing, squatting, stairs, and transfers   PARTICIPATION LIMITATIONS: community activity and occupation   [place], Time since onset of injury/illness/exacerbation, and 1-2 comorbidities: HTN, DMII  are also affecting patient's functional outcome.      GOALS: Goals reviewed with patient? No   SHORT TERM GOALS: Target date: 07/08/2022   Pt will be compliant and knowledgeable with initial HEP for improved comfort and carryover Baseline: initial HEP given  Goal status: INITIAL   2.  Pt will self report lower back and knee pain no greater than 4/10 for improved comfort and functional ability Baseline: 6/10 at worst Goal status: INITIAL    LONG TERM GOALS: Target date: 08/12/2022   Pt will self report lower back and knee pain no greater than 1-2/10 for improved comfort and functional ability Baseline: 6/10 at worst Goal status: INITIAL    2.  Pt will improve FOTO function score to no less than 43% as proxy for functional improvement Baseline: 56% function Goal status: INITIAL    3.  Pt will increase 30 Second Sit to Stand rep count to no less than 10 reps for improved balance, strength, and functional mobility Baseline: 7 reps  Goal status: INITIAL    4.  Pt will improve bilateral knee flexion ROM to no less than 110 deg for improved comfort and functional mobility with home and community activities Baseline: see chart Goal status: INITIAL   PLAN: PT FREQUENCY: 2x/week   PT DURATION: 8 weeks   PLANNED INTERVENTIONS: Therapeutic exercises, Therapeutic activity, Neuromuscular re-education, Balance training,  Gait training, Patient/Family education, Self Care, Joint mobilization, Aquatic Therapy, Dry Needling, Electrical stimulation, Cryotherapy, Moist heat, Vasopneumatic device, Manual therapy, and Re-evaluation.   PLAN FOR NEXT SESSION: assess HEP response, core and proximal hip weakness,     Ward Chatters, PT 07/06/2022, 1:39 PM

## 2022-07-08 ENCOUNTER — Ambulatory Visit: Payer: Commercial Managed Care - HMO

## 2022-07-13 ENCOUNTER — Ambulatory Visit: Payer: Commercial Managed Care - HMO

## 2022-07-13 DIAGNOSIS — R2689 Other abnormalities of gait and mobility: Secondary | ICD-10-CM

## 2022-07-13 DIAGNOSIS — M5459 Other low back pain: Secondary | ICD-10-CM | POA: Diagnosis not present

## 2022-07-13 DIAGNOSIS — M6281 Muscle weakness (generalized): Secondary | ICD-10-CM

## 2022-07-13 DIAGNOSIS — G8929 Other chronic pain: Secondary | ICD-10-CM

## 2022-07-13 NOTE — Therapy (Signed)
OUTPATIENT PHYSICAL THERAPY TREATMENT NOTE   Patient Name: Jordan Ramirez MRN: [id number] DOB:1960/11/12, 61 y.o., female Today's Date: 07/13/2022  PCP: Martinique, Betty G, MD REFERRING PROVIDER: Martinique, Betty G, MD  END OF SESSION:   PT End of Session - 07/13/22 1132     Visit Number 5    Number of Visits 17    Date for PT Re-Evaluation 08/12/22    Authorization Type [employer]    PT Start Time 1132    PT Stop Time 1211    PT Time Calculation (min) 39 min    Activity Tolerance Patient tolerated treatment well    Behavior During Therapy WFL for tasks assessed/performed               Past Medical History:  Diagnosis Date   Arthritis    Chronic low back pain    Diabetes mellitus without complication ([place])    Hypertension    Obesity    Past Surgical History:  Procedure Laterality Date   PILONIDAL CYST EXCISION     WISDOM TOOTH EXTRACTION     Patient Active Problem List   Diagnosis Date Noted   Peripheral polyneuropathy 05/25/2022   Chronic bilateral low back pain 05/25/2022   Mixed incontinence urge and stress 02/13/2020   Bilateral lower extremity edema 02/13/2020   Ganglion cyst of dorsum of right wrist 01/16/2018   Bilateral knee pain 04/17/2012   Pain of left heel 04/17/2012   Diabetes mellitus type 2 with neurological manifestations ([place]) 08/28/2009   THUMB PAIN, LEFT 08/27/2009   Hyperlipemia 08/20/2009   Morbid obesity ([place]) 08/20/2009   ANEMIA-NOS 08/20/2009   Essential hypertension 08/20/2009   ALLERGIC RHINITIS 08/20/2009   ASTHMA 08/20/2009   MENORRHAGIA, PERIMENOPAUSAL 08/20/2009   DEGENERATIVE JOINT DISEASE, KNEE 08/20/2009   TENDINITIS, LEFT THUMB 08/20/2009    REFERRING DIAG: M54.50,G89.29 (ICD-10-CM) - Chronic bilateral low back pain, unspecified whether sciatica present  THERAPY DIAG:  Other low back pain  Chronic pain of right knee  Chronic pain of left knee  Muscle weakness (generalized)  Other abnormalities of gait and  mobility  Rationale for Evaluation and Treatment Rehabilitation  PERTINENT HISTORY: HTN, DMII  PRECAUTIONS: None  SUBJECTIVE: Pt presents to PT with no reports of lower back pain, does have continued knee stiffness. She has been compliant with HEP with no adverse effect or increase in pain. She is ready to begin PT at this time.   PAIN:  Are you having pain?  No: NPRS scale: 0/10 (6/10) Pain location: lower back Pain description: achy, tired Aggravating factors: prolonged standing Relieving factors: rest            Are you having pain?  Yes: NPRS scale: 5/10 (6/10) Pain location: bilateral knees Pain description: sharp Aggravating factors: stairs Relieving factors: rest, ice   OBJECTIVE: (objective measures completed at initial evaluation unless otherwise dated)  DIAGNOSTIC FINDINGS:  See imaging    PATIENT SURVEYS:  FOTO: 43% function; 56% predicted   COGNITION:           Overall cognitive status: Within functional limits for tasks assessed               SENSATION: WFL   MUSCLE LENGTH: Thomas test: Right (-); Left (+)   POSTURE: rounded shoulders and increased lumbar lordosis and larger body habitus   PALPATION: TTP to lumbar paraspinals   LUMBAR ROM:    Active  A/PROM  eval  Flexion WFL  Extension 50% limited with p!  Right lateral  flexion    Left lateral flexion    Right rotation 50% limited with p!  Left rotation 50% limited with p!   (Blank rows = not tested)   LOWER EXTREMITY ROM:      Active  Right eval Left eval  Hip flexion      Hip extension      Hip abduction      Hip adduction      Hip internal rotation      Hip external rotation      Knee flexion 87 100  Knee extension      Ankle dorsiflexion      Ankle plantarflexion      Ankle inversion      Ankle eversion       (Blank rows = not tested)   LOWER EXTREMITY MMT:     MMT Right eval Left eval  Hip flexion 4/5 4/5  Hip extension      Hip abduction 4/5 4/5  Hip adduction  4/5 3+/5 p!  Hip internal rotation      Hip external rotation      Knee flexion 5/5 5/5  Knee extension 5/5 5/5  Ankle dorsiflexion      Ankle plantarflexion      Ankle inversion      Ankle eversion       (Blank rows = not tested)   LUMBAR SPECIAL TESTS:  Straight leg raise test: Negative and Slump test: Negative   FUNCTIONAL TESTS:  30 Second Sit to Stand: 7 reps   GAIT: Distance walked: 58f Assistive device utilized: None Level of assistance: Complete Independence Comments: decreased gait speed, trunk flexed       TODAY'S TREATMENT  [facility] Adult PT Treatment:                                                DATE: 07/13/2022 Therapeutic Exercise: NuStep lvl 5 UE/LE x 3 min while taking subjective STS 2x10 - no UE support - high table Supine hamstring stretch with strap 2x30" each Bridge x 15 Supine PPT alternating SLR x 10  Supine heel slide with strap x 10 - 10" hold R Supine PPT with ball x 15 - 5" hold Standing hip abd/ext 2x10 25# Step up fwd x 10 8in   PATIENT EDUCATION:  Education details: HEP update Person educated: Patient Education method: Explanation, Demonstration, and Handouts Education comprehension: verbalized understanding and returned demonstration     HOME EXERCISE PROGRAM: Access Code: [email or web address]: [email or web address] Date: 07/13/2022 Prepared by: DOctavio Manns Exercises - Standing Shoulder Row with Anchored Resistance  - 1 x daily - 7 x weekly - 3 sets - 10 reps - Supine Posterior Pelvic Tilt  - 1 x daily - 7 x weekly - 3 sets - 10 reps - 5 hold - Active Straight Leg Raise with Quad Set  - 1 x daily - 7 x weekly - 2-3 sets - 10 reps - Modified Thomas Stretch  - 1 x daily - 7 x weekly - 2 sets - 30 sec hold - Supine Bridge  - 1 x daily - 7 x weekly - 2-3 sets - 10 reps - Clamshell with Resistance  - 1 x daily - 7 x weekly - 2 sets - 10 reps - Step Up  - 1 x daily - 7 x weekly - 2 sets -  10 reps - Supine Heel Slide with Strap   - 1 x daily - 7 x weekly - 2 sets - 10 reps - 10 second hold   ASSESSMENT:   CLINICAL IMPRESSION: Pt was again able to complete all prescribed exercises with no adverse effect. Therapy today continued to focus on improving core and proximal hip strength. She is progressing as expected, will continue per POC as prescribed.    OBJECTIVE IMPAIRMENTS decreased activity tolerance, decreased balance, decreased endurance, decreased mobility, difficulty walking, decreased ROM, decreased strength, postural dysfunction, and pain    ACTIVITY LIMITATIONS lifting, standing, squatting, stairs, and transfers   PARTICIPATION LIMITATIONS: community activity and occupation   [place], Time since onset of injury/illness/exacerbation, and 1-2 comorbidities: HTN, DMII  are also affecting patient's functional outcome.      GOALS: Goals reviewed with patient? No   SHORT TERM GOALS: Target date: 07/08/2022   Pt will be compliant and knowledgeable with initial HEP for improved comfort and carryover Baseline: initial HEP given  Goal status: INITIAL   2.  Pt will self report lower back and knee pain no greater than 4/10 for improved comfort and functional ability Baseline: 6/10 at worst Goal status: INITIAL    LONG TERM GOALS: Target date: 08/12/2022   Pt will self report lower back and knee pain no greater than 1-2/10 for improved comfort and functional ability Baseline: 6/10 at worst Goal status: INITIAL    2.  Pt will improve FOTO function score to no less than 43% as proxy for functional improvement Baseline: 56% function Goal status: INITIAL    3.  Pt will increase 30 Second Sit to Stand rep count to no less than 10 reps for improved balance, strength, and functional mobility Baseline: 7 reps  Goal status: INITIAL    4.  Pt will improve bilateral knee flexion ROM to no less than 110 deg for improved comfort and functional mobility with home and community activities Baseline: see  chart Goal status: INITIAL   PLAN: PT FREQUENCY: 2x/week   PT DURATION: 8 weeks   PLANNED INTERVENTIONS: Therapeutic exercises, Therapeutic activity, Neuromuscular re-education, Balance training, Gait training, Patient/Family education, Self Care, Joint mobilization, Aquatic Therapy, Dry Needling, Electrical stimulation, Cryotherapy, Moist heat, Vasopneumatic device, Manual therapy, and Re-evaluation.   PLAN FOR NEXT SESSION: assess HEP response, core and proximal hip weakness,     Ward Chatters, PT 07/13/2022, 12:11 PM

## 2022-07-15 ENCOUNTER — Ambulatory Visit: Payer: Commercial Managed Care - HMO

## 2022-07-15 DIAGNOSIS — G8929 Other chronic pain: Secondary | ICD-10-CM

## 2022-07-15 DIAGNOSIS — M6281 Muscle weakness (generalized): Secondary | ICD-10-CM

## 2022-07-15 DIAGNOSIS — M5459 Other low back pain: Secondary | ICD-10-CM | POA: Diagnosis not present

## 2022-07-15 DIAGNOSIS — R2689 Other abnormalities of gait and mobility: Secondary | ICD-10-CM

## 2022-07-15 NOTE — Therapy (Signed)
OUTPATIENT PHYSICAL THERAPY TREATMENT NOTE   Patient Name: Jordan Ramirez MRN: [id number] DOB:May 27, 1961, 61 y.o., female Today's Date: 07/15/2022  PCP: Martinique, Betty G, MD REFERRING PROVIDER: [place]ique, Betty G, MD  END OF SESSION:   PT End of Session - 07/15/22 1143     Visit Number 6    Number of Visits 17    Date for PT Re-Evaluation 08/12/22    Authorization Type [employer]    PT Start Time 1143    PT Stop Time 1215    PT Time Calculation (min) 32 min    Activity Tolerance Patient tolerated treatment well    Behavior During Therapy WFL for tasks assessed/performed                Past Medical History:  Diagnosis Date   Arthritis    Chronic low back pain    Diabetes mellitus without complication ([place])    Hypertension    Obesity    Past Surgical History:  Procedure Laterality Date   PILONIDAL CYST EXCISION     WISDOM TOOTH EXTRACTION     Patient Active Problem List   Diagnosis Date Noted   Peripheral polyneuropathy 05/25/2022   Chronic bilateral low back pain 05/25/2022   Mixed incontinence urge and stress 02/13/2020   Bilateral lower extremity edema 02/13/2020   Ganglion cyst of dorsum of right wrist 01/16/2018   Bilateral knee pain 04/17/2012   Pain of left heel 04/17/2012   Diabetes mellitus type 2 with neurological manifestations ([place]) 08/28/2009   THUMB PAIN, LEFT 08/27/2009   Hyperlipemia 08/20/2009   Morbid obesity ([place]) 08/20/2009   ANEMIA-NOS 08/20/2009   Essential hypertension 08/20/2009   ALLERGIC RHINITIS 08/20/2009   ASTHMA 08/20/2009   MENORRHAGIA, PERIMENOPAUSAL 08/20/2009   DEGENERATIVE JOINT DISEASE, KNEE 08/20/2009   TENDINITIS, LEFT THUMB 08/20/2009    REFERRING DIAG: M54.50,G89.29 (ICD-10-CM) - Chronic bilateral low back pain, unspecified whether sciatica present  THERAPY DIAG:  Other low back pain  Chronic pain of right knee  Chronic pain of left knee  Muscle weakness (generalized)  Other abnormalities of gait and  mobility  Rationale for Evaluation and Treatment Rehabilitation  PERTINENT HISTORY: HTN, DMII  PRECAUTIONS: None  SUBJECTIVE: Pt presents to PT with no current pain. Has been fairly compliant with HEP with no adverse effect. Pt is ready to begin PT at this time.   PAIN:  Are you having pain?  No: NPRS scale: 0/10 (6/10) Pain location: lower back Pain description: achy, tired Aggravating factors: prolonged standing Relieving factors: rest            Are you having pain?  Yes: NPRS scale: 5/10 (6/10) Pain location: bilateral knees Pain description: sharp Aggravating factors: stairs Relieving factors: rest, ice   OBJECTIVE: (objective measures completed at initial evaluation unless otherwise dated)  DIAGNOSTIC FINDINGS:  See imaging    PATIENT SURVEYS:  FOTO: 43% function; 56% predicted   COGNITION:           Overall cognitive status: Within functional limits for tasks assessed               SENSATION: WFL   MUSCLE LENGTH: Thomas test: Right (-); Left (+)   POSTURE: rounded shoulders and increased lumbar lordosis and larger body habitus   PALPATION: TTP to lumbar paraspinals   LUMBAR ROM:    Active  A/PROM  eval  Flexion WFL  Extension 50% limited with p!  Right lateral flexion    Left lateral flexion    Right  rotation 50% limited with p!  Left rotation 50% limited with p!   (Blank rows = not tested)   LOWER EXTREMITY ROM:      Active  Right eval Left eval  Hip flexion      Hip extension      Hip abduction      Hip adduction      Hip internal rotation      Hip external rotation      Knee flexion 87 100  Knee extension      Ankle dorsiflexion      Ankle plantarflexion      Ankle inversion      Ankle eversion       (Blank rows = not tested)   LOWER EXTREMITY MMT:     MMT Right eval Left eval  Hip flexion 4/5 4/5  Hip extension      Hip abduction 4/5 4/5  Hip adduction 4/5 3+/5 p!  Hip internal rotation      Hip external rotation       Knee flexion 5/5 5/5  Knee extension 5/5 5/5  Ankle dorsiflexion      Ankle plantarflexion      Ankle inversion      Ankle eversion       (Blank rows = not tested)   LUMBAR SPECIAL TESTS:  Straight leg raise test: Negative and Slump test: Negative   FUNCTIONAL TESTS:  30 Second Sit to Stand: 7 reps   GAIT: Distance walked: 53f Assistive device utilized: None Level of assistance: Complete Independence Comments: decreased gait speed, trunk flexed       TODAY'S TREATMENT  [facility] Adult PT Treatment:                                                DATE: 07/15/2022 Therapeutic Exercise: NuStep lvl 6 UE/LE x 5 min while taking subjective STS 2x10 - 15# KB - high table Supine hamstring stretch with strap x 60" each LTR x 5 - 5" hold Bridge x 15 Supine PPT alternating SLR x 10  Supine PPT with alt clam GTB 2x10 Standing hip abd 2x10 25# Standing hip ext x 10 25# Step up fwd x 10 8in   [facility] Adult PT Treatment:                                                DATE: 07/13/2022 Therapeutic Exercise: NuStep lvl 5 UE/LE x 3 min while taking subjective STS 2x10 - no UE support - high table Supine hamstring stretch with strap 2x30" each Bridge x 15 Supine PPT alternating SLR x 10  Supine heel slide with strap x 10 - 10" hold R Supine PPT with ball x 15 - 5" hold Standing hip abd/ext 2x10 25# Step up fwd x 10 8in   PATIENT EDUCATION:  Education details: HEP update Person educated: Patient Education method: Explanation, Demonstration, and Handouts Education comprehension: verbalized understanding and returned demonstration     HOME EXERCISE PROGRAM: Access Code: [email or web address]: [email or web address] Date: 07/13/2022 Prepared by: DOctavio Manns Exercises - Standing Shoulder Row with Anchored Resistance  - 1 x daily - 7 x weekly - 3 sets - 10 reps - Supine Posterior Pelvic Tilt  -  1 x daily - 7 x weekly - 3 sets - 10 reps - 5 hold - Active Straight Leg Raise with  Quad Set  - 1 x daily - 7 x weekly - 2-3 sets - 10 reps - Modified Thomas Stretch  - 1 x daily - 7 x weekly - 2 sets - 30 sec hold - Supine Bridge  - 1 x daily - 7 x weekly - 2-3 sets - 10 reps - Clamshell with Resistance  - 1 x daily - 7 x weekly - 2 sets - 10 reps - Step Up  - 1 x daily - 7 x weekly - 2 sets - 10 reps - Supine Heel Slide with Strap  - 1 x daily - 7 x weekly - 2 sets - 10 reps - 10 second hold   ASSESSMENT:   CLINICAL IMPRESSION: Pt was again able to complete all prescribed exercises with no adverse effect, did have slight discomfort in L groin. Therapy focused on improving core and proximal hip strength in order to decrease pain and improve function. She continues to progress well with therapy and will continue to be seen and progressed as able.    OBJECTIVE IMPAIRMENTS decreased activity tolerance, decreased balance, decreased endurance, decreased mobility, difficulty walking, decreased ROM, decreased strength, postural dysfunction, and pain    ACTIVITY LIMITATIONS lifting, standing, squatting, stairs, and transfers   PARTICIPATION LIMITATIONS: community activity and occupation   [place], Time since onset of injury/illness/exacerbation, and 1-2 comorbidities: HTN, DMII  are also affecting patient's functional outcome.      GOALS: Goals reviewed with patient? No   SHORT TERM GOALS: Target date: 07/08/2022   Pt will be compliant and knowledgeable with initial HEP for improved comfort and carryover Baseline: initial HEP given  Goal status: INITIAL   2.  Pt will self report lower back and knee pain no greater than 4/10 for improved comfort and functional ability Baseline: 6/10 at worst Goal status: INITIAL    LONG TERM GOALS: Target date: 08/12/2022   Pt will self report lower back and knee pain no greater than 1-2/10 for improved comfort and functional ability Baseline: 6/10 at worst Goal status: INITIAL    2.  Pt will improve FOTO function  score to no less than 43% as proxy for functional improvement Baseline: 56% function Goal status: INITIAL    3.  Pt will increase 30 Second Sit to Stand rep count to no less than 10 reps for improved balance, strength, and functional mobility Baseline: 7 reps  Goal status: INITIAL    4.  Pt will improve bilateral knee flexion ROM to no less than 110 deg for improved comfort and functional mobility with home and community activities Baseline: see chart Goal status: INITIAL   PLAN: PT FREQUENCY: 2x/week   PT DURATION: 8 weeks   PLANNED INTERVENTIONS: Therapeutic exercises, Therapeutic activity, Neuromuscular re-education, Balance training, Gait training, Patient/Family education, Self Care, Joint mobilization, Aquatic Therapy, Dry Needling, Electrical stimulation, Cryotherapy, Moist heat, Vasopneumatic device, Manual therapy, and Re-evaluation.   PLAN FOR NEXT SESSION: assess HEP response, core and proximal hip weakness,     Ward Chatters, PT 07/15/2022, 12:23 PM

## 2022-07-19 NOTE — Therapy (Signed)
OUTPATIENT PHYSICAL THERAPY TREATMENT NOTE   Patient Name: Jordan Ramirez MRN: [id number] DOB:1961-10-17, 61 y.o., female Today's Date: 07/20/2022  PCP: Martin[place], Betty G, MD REFERRING PROVIDER: [place], Betty G, MD  END OF SESSION:   PT End of Session - 07/20/22 1138     Visit Number 7    Number of Visits 17    Date for PT Re-Evaluation 08/12/22    Authorization Type [employer]    PT Start Time 1136    PT Stop Time 1215    PT Time Calculation (min) 39 min    Activity Tolerance Patient tolerated treatment well    Behavior During Therapy WFL for tasks assessed/performed                 Past Medical History:  Diagnosis Date   Arthritis    Chronic low back pain    Diabetes mellitus without complication ([place])    Hypertension    Obesity    Past Surgical History:  Procedure Laterality Date   PILONIDAL CYST EXCISION     WISDOM TOOTH EXTRACTION     Patient Active Problem List   Diagnosis Date Noted   Peripheral polyneuropathy 05/25/2022   Chronic bilateral low back pain 05/25/2022   Mixed incontinence urge and stress 02/13/2020   Bilateral lower extremity edema 02/13/2020   Ganglion cyst of dorsum of right wrist 01/16/2018   Bilateral knee pain 04/17/2012   Pain of left heel 04/17/2012   Diabetes mellitus type 2 with neurological manifestations ([place]) 08/28/2009   THUMB PAIN, LEFT 08/27/2009   Hyperlipemia 08/20/2009   Morbid obesity ([place]) 08/20/2009   ANEMIA-NOS 08/20/2009   Essential hypertension 08/20/2009   ALLERGIC RHINITIS 08/20/2009   ASTHMA 08/20/2009   MENORRHAGIA, PERIMENOPAUSAL 08/20/2009   DEGENERATIVE JOINT DISEASE, KNEE 08/20/2009   TENDINITIS, LEFT THUMB 08/20/2009    REFERRING DIAG: M54.50,G89.29 (ICD-10-CM) - Chronic bilateral low back pain, unspecified whether sciatica present  THERAPY DIAG:  Other low back pain  Chronic pain of right knee  Chronic pain of left knee  Muscle weakness (generalized)  Other abnormalities of gait and  mobility  Rationale for Evaluation and Treatment Rehabilitation  PERTINENT HISTORY: HTN, DMII  PRECAUTIONS: None  SUBJECTIVE: Pt presents to PT with reports of slight R sided lower back and bilateral knee pain. Has been compliant with HEP with no adverse effect. Pt is ready to begin PT at this time.   PAIN:  Are you having pain?  No: NPRS scale: 2/10 (6/10) Pain location: lower back Pain description: achy, tired Aggravating factors: prolonged standing Relieving factors: rest            Are you having pain?  Yes: NPRS scale: 2/10 (6/10) Pain location: bilateral knees Pain description: sharp Aggravating factors: stairs Relieving factors: rest, ice   OBJECTIVE: (objective measures completed at initial evaluation unless otherwise dated)  DIAGNOSTIC FINDINGS:  See imaging    PATIENT SURVEYS:  FOTO: 58% function - 07/20/2022   COGNITION:           Overall cognitive status: Within functional limits for tasks assessed               SENSATION: WFL   MUSCLE LENGTH: Thomas test: Right (-); Left (+)   POSTURE: rounded shoulders and increased lumbar lordosis and larger body habitus   PALPATION: TTP to lumbar paraspinals   LUMBAR ROM:    Active  A/PROM  eval  Flexion WFL  Extension 50% limited with p!  Right lateral flexion  Left lateral flexion    Right rotation 50% limited with p!  Left rotation 50% limited with p!   (Blank rows = not tested)   LOWER EXTREMITY ROM:      Active  Right eval Left eval  Hip flexion      Hip extension      Hip abduction      Hip adduction      Hip internal rotation      Hip external rotation      Knee flexion 87 100  Knee extension      Ankle dorsiflexion      Ankle plantarflexion      Ankle inversion      Ankle eversion       (Blank rows = not tested)   LOWER EXTREMITY MMT:     MMT Right eval Left eval  Hip flexion 4/5 4/5  Hip extension      Hip abduction 4/5 4/5  Hip adduction 4/5 3+/5 p!  Hip internal  rotation      Hip external rotation      Knee flexion 5/5 5/5  Knee extension 5/5 5/5  Ankle dorsiflexion      Ankle plantarflexion      Ankle inversion      Ankle eversion       (Blank rows = not tested)   LUMBAR SPECIAL TESTS:  Straight leg raise test: Negative and Slump test: Negative   FUNCTIONAL TESTS:  30 Second Sit to Stand: 7 reps   GAIT: Distance walked: 1f Assistive device utilized: None Level of assistance: Complete Independence Comments: decreased gait speed, trunk flexed       TODAY'S TREATMENT  [facility] Adult PT Treatment:                                                DATE: 07/20/2022 Therapeutic Exercise: NuStep lvl 6 UE/LE x 3 min while taking subjective Supine hamstring stretch with strap x 60" each LTR x 5 - 5" hold Bridge x 15 Supine SLR 2X10 2# Supine PPT with alt clam GTB x 20 S/L clamshell 2x10 GTB Supine pilates ring x 10 - 5" hold Standing hip abd 2x10 25# Standing hip ext x 10 25# Step up fwd x 10 8in   [facility] Adult PT Treatment:                                                DATE: 07/15/2022 Therapeutic Exercise: NuStep lvl 6 UE/LE x 5 min while taking subjective STS 2x10 - 15# KB - high table Supine hamstring stretch with strap x 60" each LTR x 5 - 5" hold Bridge x 15 Supine PPT alternating SLR x 10  Supine PPT with alt clam GTB 2x10 Lateral walk RTB x 4 laps at counter Step up fwd x 10 8in   [facility] Adult PT Treatment:                                                DATE: 07/13/2022 Therapeutic Exercise: NuStep lvl 5 UE/LE x 3 min while taking subjective STS  2x10 - no UE support - high table Supine hamstring stretch with strap 2x30" each Bridge x 15 Supine PPT alternating SLR x 10  Supine heel slide with strap x 10 - 10" hold R Supine PPT with ball x 15 - 5" hold Standing hip abd/ext 2x10 25# Step up fwd x 10 8in   PATIENT EDUCATION:  Education details: HEP update Person educated: Patient Education method: Explanation, Demonstration,  and Handouts Education comprehension: verbalized understanding and returned demonstration     HOME EXERCISE PROGRAM: Access Code: [email or web address] URL: [email or web address] Date: 07/13/2022 Prepared by: Octavio Manns  Exercises - Standing Shoulder Row with Anchored Resistance  - 1 x daily - 7 x weekly - 3 sets - 10 reps - Supine Posterior Pelvic Tilt  - 1 x daily - 7 x weekly - 3 sets - 10 reps - 5 hold - Active Straight Leg Raise with Quad Set  - 1 x daily - 7 x weekly - 2-3 sets - 10 reps - Modified Thomas Stretch  - 1 x daily - 7 x weekly - 2 sets - 30 sec hold - Supine Bridge  - 1 x daily - 7 x weekly - 2-3 sets - 10 reps - Clamshell with Resistance  - 1 x daily - 7 x weekly - 2 sets - 10 reps - Step Up  - 1 x daily - 7 x weekly - 2 sets - 10 reps - Supine Heel Slide with Strap  - 1 x daily - 7 x weekly - 2 sets - 10 reps - 10 second hold   ASSESSMENT:   CLINICAL IMPRESSION: Pt was once again able to complete prescribed exercises with no adverse effect. Therapy today continued to progress core and proximal hip strengthening exercises. She has met her FOTO score and is progressing as expected with therapy overall. Will continue per POC as prescribed.   OBJECTIVE IMPAIRMENTS decreased activity tolerance, decreased balance, decreased endurance, decreased mobility, difficulty walking, decreased ROM, decreased strength, postural dysfunction, and pain    ACTIVITY LIMITATIONS lifting, standing, squatting, stairs, and transfers   PARTICIPATION LIMITATIONS: community activity and occupation   [place], Time since onset of injury/illness/exacerbation, and 1-2 comorbidities: HTN, DMII  are also affecting patient's functional outcome.      GOALS: Goals reviewed with patient? No   SHORT TERM GOALS: Target date: 07/08/2022   Pt will be compliant and knowledgeable with initial HEP for improved comfort and carryover Baseline: initial HEP given  Goal status: MET    2.  Pt will self report lower back and knee pain no greater than 4/10 for improved comfort and functional ability Baseline: 6/10 at worst Goal status: MET   LONG TERM GOALS: Target date: 08/12/2022   Pt will self report lower back and knee pain no greater than 1-2/10 for improved comfort and functional ability Baseline: 6/10 at worst Goal status:ONGOING   2.  Pt will improve FOTO function score to no less than 43% as proxy for functional improvement Baseline: 56% function 07/20/2022: 58% function Goal status: MET    3.  Pt will increase 30 Second Sit to Stand rep count to no less than 10 reps for improved balance, strength, and functional mobility Baseline: 7 reps  07/20/2022: 8 reps Goal status: ONGOING   4.  Pt will improve bilateral knee flexion ROM to no less than 110 deg for improved comfort and functional mobility with home and community activities Baseline: see chart Goal status: ONGOING   PLAN: PT FREQUENCY:  2x/week   PT DURATION: 8 weeks   PLANNED INTERVENTIONS: Therapeutic exercises, Therapeutic activity, Neuromuscular re-education, Balance training, Gait training, Patient/Family education, Self Care, Joint mobilization, Aquatic Therapy, Dry Needling, Electrical stimulation, Cryotherapy, Moist heat, Vasopneumatic device, Manual therapy, and Re-evaluation.   PLAN FOR NEXT SESSION: assess HEP response, core and proximal hip weakness,     Ward Chatters, PT 07/20/2022, 12:15 PM

## 2022-07-20 ENCOUNTER — Ambulatory Visit: Payer: Commercial Managed Care - HMO | Attending: Family Medicine

## 2022-07-20 DIAGNOSIS — M25561 Pain in right knee: Secondary | ICD-10-CM | POA: Diagnosis present

## 2022-07-20 DIAGNOSIS — M6281 Muscle weakness (generalized): Secondary | ICD-10-CM | POA: Diagnosis present

## 2022-07-20 DIAGNOSIS — R2689 Other abnormalities of gait and mobility: Secondary | ICD-10-CM | POA: Diagnosis present

## 2022-07-20 DIAGNOSIS — G8929 Other chronic pain: Secondary | ICD-10-CM | POA: Diagnosis present

## 2022-07-20 DIAGNOSIS — M5459 Other low back pain: Secondary | ICD-10-CM | POA: Insufficient documentation

## 2022-07-20 DIAGNOSIS — M25562 Pain in left knee: Secondary | ICD-10-CM | POA: Insufficient documentation

## 2022-07-21 NOTE — Therapy (Signed)
OUTPATIENT PHYSICAL THERAPY TREATMENT NOTE   Patient Name: Jordan Ramirez MRN: [id number] DOB:11-08-1960, 61 y.o., female Today's Date: 07/22/2022  PCP: Martinique, Betty G, MD REFERRING PROVIDER: [place]ique, Betty G, MD  END OF SESSION:   PT End of Session - 07/22/22 1140     Visit Number 8    Number of Visits 17    Date for PT Re-Evaluation 08/12/22    Authorization Type [employer]    PT Start Time 1138    PT Stop Time 1215    PT Time Calculation (min) 37 min    Activity Tolerance Patient tolerated treatment well    Behavior During Therapy WFL for tasks assessed/performed                  Past Medical History:  Diagnosis Date   Arthritis    Chronic low back pain    Diabetes mellitus without complication ([place])    Hypertension    Obesity    Past Surgical History:  Procedure Laterality Date   PILONIDAL CYST EXCISION     WISDOM TOOTH EXTRACTION     Patient Active Problem List   Diagnosis Date Noted   Peripheral polyneuropathy 05/25/2022   Chronic bilateral low back pain 05/25/2022   Mixed incontinence urge and stress 02/13/2020   Bilateral lower extremity edema 02/13/2020   Ganglion cyst of dorsum of right wrist 01/16/2018   Bilateral knee pain 04/17/2012   Pain of left heel 04/17/2012   Diabetes mellitus type 2 with neurological manifestations ([place]) 08/28/2009   THUMB PAIN, LEFT 08/27/2009   Hyperlipemia 08/20/2009   Morbid obesity ([place]) 08/20/2009   ANEMIA-NOS 08/20/2009   Essential hypertension 08/20/2009   ALLERGIC RHINITIS 08/20/2009   ASTHMA 08/20/2009   MENORRHAGIA, PERIMENOPAUSAL 08/20/2009   DEGENERATIVE JOINT DISEASE, KNEE 08/20/2009   TENDINITIS, LEFT THUMB 08/20/2009    REFERRING DIAG: M54.50,G89.29 (ICD-10-CM) - Chronic bilateral low back pain, unspecified whether sciatica present  THERAPY DIAG:  Other low back pain  Chronic pain of right knee  Chronic pain of left knee  Muscle weakness (generalized)  Other abnormalities of gait and  mobility  Rationale for Evaluation and Treatment Rehabilitation  PERTINENT HISTORY: HTN, DMII  PRECAUTIONS: None  SUBJECTIVE: Pt presents to PT with no current lower back or bilateral knee pain/ Has been compliant with HEP with no adverse effect. Pt is ready to begin PT at this time.   PAIN:  Are you having pain?  No: NPRS scale: 0/10 (6/10) Pain location: lower back Pain description: achy, tired Aggravating factors: prolonged standing Relieving factors: rest            Are you having pain?  No: NPRS scale: 0/10 (6/10) Pain location: bilateral knees Pain description: sharp Aggravating factors: stairs Relieving factors: rest, ice   OBJECTIVE: (objective measures completed at initial evaluation unless otherwise dated)  DIAGNOSTIC FINDINGS:  See imaging    PATIENT SURVEYS:  FOTO: 58% function - 07/20/2022   COGNITION:           Overall cognitive status: Within functional limits for tasks assessed               SENSATION: WFL   MUSCLE LENGTH: Thomas test: Right (-); Left (+)   POSTURE: rounded shoulders and increased lumbar lordosis and larger body habitus   PALPATION: TTP to lumbar paraspinals   LUMBAR ROM:    Active  A/PROM  eval  Flexion WFL  Extension 50% limited with p!  Right lateral flexion    Left  lateral flexion    Right rotation 50% limited with p!  Left rotation 50% limited with p!   (Blank rows = not tested)   LOWER EXTREMITY ROM:      Active  Right eval Left eval  Hip flexion      Hip extension      Hip abduction      Hip adduction      Hip internal rotation      Hip external rotation      Knee flexion 87 100  Knee extension      Ankle dorsiflexion      Ankle plantarflexion      Ankle inversion      Ankle eversion       (Blank rows = not tested)   LOWER EXTREMITY MMT:     MMT Right eval Left eval  Hip flexion 4/5 4/5  Hip extension      Hip abduction 4/5 4/5  Hip adduction 4/5 3+/5 p!  Hip internal rotation      Hip  external rotation      Knee flexion 5/5 5/5  Knee extension 5/5 5/5  Ankle dorsiflexion      Ankle plantarflexion      Ankle inversion      Ankle eversion       (Blank rows = not tested)   LUMBAR SPECIAL TESTS:  Straight leg raise test: Negative and Slump test: Negative   FUNCTIONAL TESTS:  30 Second Sit to Stand: 7 reps   GAIT: Distance walked: 19f Assistive device utilized: None Level of assistance: Complete [place] Comments: decreased gait speed, trunk flexed      TODAY'S TREATMENT  [facility] Adult PT Treatment:                                                DATE: 07/22/2022 Therapeutic Exercise: NuStep lvl 6 UE/LE x 3 min while taking subjective STS 2x10 - 10# KB LTR x 5 each Supine quad sets x 10 - 5" hold Supine SLR 2x10 each Supine pilates ring x 15 - 5" hold Supine hamstring stretch with strap x 60" each Modified thomas stretch with stool 2x60" L S/L clamshell 2x15 BTB  [facility] Adult PT Treatment:                                                DATE: 07/20/2022 Therapeutic Exercise: NuStep lvl 6 UE/LE x 3 min while taking subjective Supine hamstring stretch with strap x 60" each LTR x 5 - 5" hold Bridge x 15 Supine SLR 2X10 2# Supine PPT with alt clam GTB x 20 S/L clamshell 2x10 GTB Supine pilates ring x 10 - 5" hold Standing hip abd 2x10 25# Standing hip ext x 10 25# Step up fwd x 10 8in   [facility] Adult PT Treatment:                                                DATE: 07/15/2022 Therapeutic Exercise: NuStep lvl 6 UE/LE x 5 min while taking subjective STS 2x10 - 15# KB - high table Supine  hamstring stretch with strap x 60" each LTR x 5 - 5" hold Bridge x 15 Supine PPT alternating SLR x 10  Supine PPT with alt clam GTB 2x10 Lateral walk RTB x 4 laps at counter Step up fwd x 10 8in   [facility] Adult PT Treatment:                                                DATE: 07/13/2022 Therapeutic Exercise: NuStep lvl 5 UE/LE x 3 min while taking subjective STS 2x10 - no  UE support - high table Supine hamstring stretch with strap 2x30" each Bridge x 15 Supine PPT alternating SLR x 10  Supine heel slide with strap x 10 - 10" hold R Supine PPT with ball x 15 - 5" hold Standing hip abd/ext 2x10 25# Step up fwd x 10 8in   PATIENT EDUCATION:  Education details: HEP update Person educated: Patient Education method: Explanation, Demonstration, and Handouts Education comprehension: verbalized understanding and returned demonstration     HOME EXERCISE PROGRAM: Access Code: [email or web address] URL: [email or web address] Date: 07/13/2022 Prepared by: Octavio Manns  Exercises - Standing Shoulder Row with Anchored Resistance  - 1 x daily - 7 x weekly - 3 sets - 10 reps - Supine Posterior Pelvic Tilt  - 1 x daily - 7 x weekly - 3 sets - 10 reps - 5 hold - Active Straight Leg Raise with Quad Set  - 1 x daily - 7 x weekly - 2-3 sets - 10 reps - Modified Thomas Stretch  - 1 x daily - 7 x weekly - 2 sets - 30 sec hold - Supine Bridge  - 1 x daily - 7 x weekly - 2-3 sets - 10 reps - Clamshell with Resistance  - 1 x daily - 7 x weekly - 2 sets - 10 reps - Step Up  - 1 x daily - 7 x weekly - 2 sets - 10 reps - Supine Heel Slide with Strap  - 1 x daily - 7 x weekly - 2 sets - 10 reps - 10 second hold   ASSESSMENT:   CLINICAL IMPRESSION: Pt was again able to complete all prescribed exercises with no adverse effect, did have slight discomfort in L groin with hip flexor stretch. Therapy focused on improving core and proximal hip strength in order to decrease pain and improve function. She continues to progress well with therapy and will continue to be seen and progressed as able.    OBJECTIVE IMPAIRMENTS decreased activity tolerance, decreased balance, decreased endurance, decreased mobility, difficulty walking, decreased ROM, decreased strength, postural dysfunction, and pain    ACTIVITY LIMITATIONS lifting, standing, squatting, stairs, and transfers    PARTICIPATION LIMITATIONS: community activity and occupation   [place], Time since onset of injury/illness/exacerbation, and 1-2 comorbidities: HTN, DMII  are also affecting patient's functional outcome.      GOALS: Goals reviewed with patient? No   SHORT TERM GOALS: Target date: 07/08/2022   Pt will be compliant and knowledgeable with initial HEP for improved comfort and carryover Baseline: initial HEP given  Goal status: MET   2.  Pt will self report lower back and knee pain no greater than 4/10 for improved comfort and functional ability Baseline: 6/10 at worst Goal status: MET   LONG TERM GOALS: Target date: 08/12/2022   Pt will self report  lower back and knee pain no greater than 1-2/10 for improved comfort and functional ability Baseline: 6/10 at worst Goal status:ONGOING   2.  Pt will improve FOTO function score to no less than 43% as proxy for functional improvement Baseline: 56% function 07/20/2022: 58% function Goal status: MET    3.  Pt will increase 30 Second Sit to Stand rep count to no less than 10 reps for improved balance, strength, and functional mobility Baseline: 7 reps  07/20/2022: 8 reps Goal status: ONGOING   4.  Pt will improve bilateral knee flexion ROM to no less than 110 deg for improved comfort and functional mobility with home and community activities Baseline: see chart Goal status: ONGOING   PLAN: PT FREQUENCY: 2x/week   PT DURATION: 8 weeks   PLANNED INTERVENTIONS: Therapeutic exercises, Therapeutic activity, Neuromuscular re-education, Balance training, Gait training, Patient/Family education, Self Care, Joint mobilization, Aquatic Therapy, Dry Needling, Electrical stimulation, Cryotherapy, Moist heat, Vasopneumatic device, Manual therapy, and Re-evaluation.   PLAN FOR NEXT SESSION: assess HEP response, core and proximal hip weakness,     Ward Chatters, PT 07/22/2022, 12:21 PM

## 2022-07-22 ENCOUNTER — Ambulatory Visit: Payer: Commercial Managed Care - HMO

## 2022-07-22 DIAGNOSIS — R2689 Other abnormalities of gait and mobility: Secondary | ICD-10-CM

## 2022-07-22 DIAGNOSIS — M5459 Other low back pain: Secondary | ICD-10-CM

## 2022-07-22 DIAGNOSIS — M6281 Muscle weakness (generalized): Secondary | ICD-10-CM

## 2022-07-22 DIAGNOSIS — G8929 Other chronic pain: Secondary | ICD-10-CM

## 2022-07-27 ENCOUNTER — Ambulatory Visit: Payer: Commercial Managed Care - HMO

## 2022-07-29 ENCOUNTER — Ambulatory Visit: Payer: Commercial Managed Care - HMO

## 2022-07-29 DIAGNOSIS — R2689 Other abnormalities of gait and mobility: Secondary | ICD-10-CM

## 2022-07-29 DIAGNOSIS — M6281 Muscle weakness (generalized): Secondary | ICD-10-CM

## 2022-07-29 DIAGNOSIS — G8929 Other chronic pain: Secondary | ICD-10-CM

## 2022-07-29 DIAGNOSIS — M5459 Other low back pain: Secondary | ICD-10-CM | POA: Diagnosis not present

## 2022-07-29 NOTE — Therapy (Signed)
OUTPATIENT PHYSICAL THERAPY TREATMENT NOTE   Patient Name: Jordan Ramirez MRN: [id number] DOB:June 27, 1961, 60 y.o., female Today's Date: 07/29/2022  PCP: [place], Betty G, MD REFERRING PROVIDER: [place], Betty G, MD  END OF SESSION:   PT End of Session - 07/29/22 1404     Visit Number 9    Number of Visits 17    Date for PT Re-Evaluation 08/12/22    Authorization Type [employer]    PT Start Time 1404    PT Stop Time 1444    PT Time Calculation (min) 40 min    Activity Tolerance Patient tolerated treatment well    Behavior During Therapy WFL for tasks assessed/performed                   Past Medical History:  Diagnosis Date   Arthritis    Chronic low back pain    Diabetes mellitus without complication ([place])    Hypertension    Obesity    Past Surgical History:  Procedure Laterality Date   PILONIDAL CYST EXCISION     WISDOM TOOTH EXTRACTION     Patient Active Problem List   Diagnosis Date Noted   Peripheral polyneuropathy 05/25/2022   Chronic bilateral low back pain 05/25/2022   Mixed incontinence urge and stress 02/13/2020   Bilateral lower extremity edema 02/13/2020   Ganglion cyst of dorsum of right wrist 01/16/2018   Bilateral knee pain 04/17/2012   Pain of left heel 04/17/2012   Diabetes mellitus type 2 with neurological manifestations ([place]) 08/28/2009   THUMB PAIN, LEFT 08/27/2009   Hyperlipemia 08/20/2009   Morbid obesity ([place]) 08/20/2009   ANEMIA-NOS 08/20/2009   Essential hypertension 08/20/2009   ALLERGIC RHINITIS 08/20/2009   ASTHMA 08/20/2009   MENORRHAGIA, PERIMENOPAUSAL 08/20/2009   DEGENERATIVE JOINT DISEASE, KNEE 08/20/2009   TENDINITIS, LEFT THUMB 08/20/2009    REFERRING DIAG: M54.50,G89.29 (ICD-10-CM) - Chronic bilateral low back pain, unspecified whether sciatica present  THERAPY DIAG:  Other low back pain  Chronic pain of right knee  Chronic pain of left knee  Muscle weakness (generalized)  Other abnormalities of gait and  mobility  Rationale for Evaluation and Treatment Rehabilitation  PERTINENT HISTORY: HTN, DMII  PRECAUTIONS: None  SUBJECTIVE: Pt presents to PT with reports of recent increase in pain in bilateral knees and lower back. Has been compliant with HEP, no adverse effect. Pt is ready to begin PT at this time.   PAIN:  Are you having pain?  No: NPRS scale: 3/10 (6/10) Pain location: lower back Pain description: achy, tired Aggravating factors: prolonged standing Relieving factors: rest            Are you having pain?  Yes: NPRS scale: 4/10 (6/10) Pain location: bilateral knees Pain description: sharp Aggravating factors: stairs Relieving factors: rest, ice   OBJECTIVE: (objective measures completed at initial evaluation unless otherwise dated)  DIAGNOSTIC FINDINGS:  See imaging    PATIENT SURVEYS:  FOTO: 58% function - 07/20/2022   COGNITION:           Overall cognitive status: Within functional limits for tasks assessed               SENSATION: WFL   MUSCLE LENGTH: Thomas test: Right (-); Left (+)   POSTURE: rounded shoulders and increased lumbar lordosis and larger body habitus   PALPATION: TTP to lumbar paraspinals   LUMBAR ROM:    Active  A/PROM  eval  Flexion WFL  Extension 50% limited with p!  Right lateral flexion  Left lateral flexion    Right rotation 50% limited with p!  Left rotation 50% limited with p!   (Blank rows = not tested)   LOWER EXTREMITY ROM:      Active  Right eval Right  07/29/2022  Hip flexion      Hip extension      Hip abduction      Hip adduction      Hip internal rotation      Hip external rotation      Knee flexion 87 92  Knee extension      Ankle dorsiflexion      Ankle plantarflexion      Ankle inversion      Ankle eversion       (Blank rows = not tested)   LOWER EXTREMITY MMT:     MMT Right eval Left eval  Hip flexion 4/5 4/5  Hip extension      Hip abduction 4/5 4/5  Hip adduction 4/5 3+/5 p!  Hip  internal rotation      Hip external rotation      Knee flexion 5/5 5/5  Knee extension 5/5 5/5  Ankle dorsiflexion      Ankle plantarflexion      Ankle inversion      Ankle eversion       (Blank rows = not tested)   LUMBAR SPECIAL TESTS:  Straight leg raise test: Negative and Slump test: Negative   FUNCTIONAL TESTS:  30 Second Sit to Stand: 7 reps   GAIT: Distance walked: 97ft Assistive device utilized: None Level of assistance: Complete [place] Comments: decreased gait speed, trunk flexed      TODAY'S TREATMENT  [facility] Adult PT Treatment:                                                DATE: 07/29/2022 Therapeutic Exercise: NuStep lvl 6 UE/LE x 4 min while taking subjective STS 2x10 - 10# KB Supine quad sets x 10 - 5" hold - towel under heels Supine SLR x 15 each Supine heel slide with strap x 10 - 5" hold  Standing mini squat x 15 - UE support  Slant board calf stretch 2x30" Seated knee ext 3x10 10# Seated hamstring curl 2x10 35# Standing hip abd 2x10 25# Leg press 2x10 40# 2x10  [facility] Adult PT Treatment:                                                DATE: 07/22/2022 Therapeutic Exercise: NuStep lvl 6 UE/LE x 3 min while taking subjective STS 2x10 - 10# KB LTR x 5 each Supine quad sets x 10 - 5" hold Supine SLR 2x10 each Supine pilates ring x 15 - 5" hold Supine hamstring stretch with strap x 60" each Modified thomas stretch with stool 2x60" L S/L clamshell 2x15 BTB  [facility] Adult PT Treatment:                                                DATE: 07/20/2022 Therapeutic Exercise: NuStep lvl 6 UE/LE x 3 min while taking  subjective Supine hamstring stretch with strap x 60" each LTR x 5 - 5" hold Bridge x 15 Supine SLR 2X10 2# Supine PPT with alt clam GTB x 20 S/L clamshell 2x10 GTB Supine pilates ring x 10 - 5" hold Standing hip abd 2x10 25# Standing hip ext x 10 25# Step up fwd x 10 8in   [facility] Adult PT Treatment:                                                 DATE: 07/15/2022 Therapeutic Exercise: NuStep lvl 6 UE/LE x 5 min while taking subjective STS 2x10 - 15# KB - high table Supine hamstring stretch with strap x 60" each LTR x 5 - 5" hold Bridge x 15 Supine PPT alternating SLR x 10  Supine PPT with alt clam GTB 2x10 Lateral walk RTB x 4 laps at counter Step up fwd x 10 8in   PATIENT EDUCATION:  Education details: HEP update Person educated: Patient Education method: Explanation, Demonstration, and Handouts Education comprehension: verbalized understanding and returned demonstration     HOME EXERCISE PROGRAM: Access Code: [email or web address] URL: [email or web address] Date: 07/13/2022 Prepared by: Octavio Manns  Exercises - Standing Shoulder Row with Anchored Resistance  - 1 x daily - 7 x weekly - 3 sets - 10 reps - Supine Posterior Pelvic Tilt  - 1 x daily - 7 x weekly - 3 sets - 10 reps - 5 hold - Active Straight Leg Raise with Quad Set  - 1 x daily - 7 x weekly - 2-3 sets - 10 reps - Modified Thomas Stretch  - 1 x daily - 7 x weekly - 2 sets - 30 sec hold - Supine Bridge  - 1 x daily - 7 x weekly - 2-3 sets - 10 reps - Clamshell with Resistance  - 1 x daily - 7 x weekly - 2 sets - 10 reps - Step Up  - 1 x daily - 7 x weekly - 2 sets - 10 reps - Supine Heel Slide with Strap  - 1 x daily - 7 x weekly - 2 sets - 10 reps - 10 second hold   ASSESSMENT:   CLINICAL IMPRESSION: Pt was able to complete all prescribed exercises with no adverse effect or increase in pain. Therapy focused on improving LE strength with pt handling progression well. Pt is progressing well with therapy, will continue to be seen and progressed as able per POC.    OBJECTIVE IMPAIRMENTS decreased activity tolerance, decreased balance, decreased endurance, decreased mobility, difficulty walking, decreased ROM, decreased strength, postural dysfunction, and pain    ACTIVITY LIMITATIONS lifting, standing, squatting, stairs, and transfers   PARTICIPATION  LIMITATIONS: community activity and occupation   [place], Time since onset of injury/illness/exacerbation, and 1-2 comorbidities: HTN, DMII  are also affecting patient's functional outcome.      GOALS: Goals reviewed with patient? No   SHORT TERM GOALS: Target date: 07/08/2022   Pt will be compliant and knowledgeable with initial HEP for improved comfort and carryover Baseline: initial HEP given  Goal status: MET   2.  Pt will self report lower back and knee pain no greater than 4/10 for improved comfort and functional ability Baseline: 6/10 at worst Goal status: MET   LONG TERM GOALS: Target date: 08/12/2022   Pt will self report lower back and  knee pain no greater than 1-2/10 for improved comfort and functional ability Baseline: 6/10 at worst Goal status:ONGOING   2.  Pt will improve FOTO function score to no less than 43% as proxy for functional improvement Baseline: 56% function 07/20/2022: 58% function Goal status: MET    3.  Pt will increase 30 Second Sit to Stand rep count to no less than 10 reps for improved balance, strength, and functional mobility Baseline: 7 reps  07/20/2022: 8 reps Goal status: ONGOING   4.  Pt will improve bilateral knee flexion ROM to no less than 110 deg for improved comfort and functional mobility with home and community activities Baseline: see chart Goal status: ONGOING   PLAN: PT FREQUENCY: 2x/week   PT DURATION: 8 weeks   PLANNED INTERVENTIONS: Therapeutic exercises, Therapeutic activity, Neuromuscular re-education, Balance training, Gait training, Patient/Family education, Self Care, Joint mobilization, Aquatic Therapy, Dry Needling, Electrical stimulation, Cryotherapy, Moist heat, Vasopneumatic device, Manual therapy, and Re-evaluation.   PLAN FOR NEXT SESSION: assess HEP response, core and proximal hip weakness,     Ward Chatters, PT 07/29/2022, 3:47 PM

## 2022-08-02 NOTE — Therapy (Signed)
OUTPATIENT PHYSICAL THERAPY TREATMENT NOTE   Patient Name: Jordan Ramirez MRN: [id number] DOB:1961-07-21, 61 y.o., female Today's Date: 08/03/2022  PCP: Martinique, Betty G, MD REFERRING PROVIDER: Martinique, Betty G, MD  END OF SESSION:   PT End of Session - 08/03/22 1131     Visit Number 10    Number of Visits 17    Date for PT Re-Evaluation 08/12/22    Authorization Type [employer]    PT Start Time 1131    PT Stop Time 1211    PT Time Calculation (min) 40 min    Activity Tolerance Patient tolerated treatment well    Behavior During Therapy WFL for tasks assessed/performed                    Past Medical History:  Diagnosis Date   Arthritis    Chronic low back pain    Diabetes mellitus without complication ([place])    Hypertension    Obesity    Past Surgical History:  Procedure Laterality Date   PILONIDAL CYST EXCISION     WISDOM TOOTH EXTRACTION     Patient Active Problem List   Diagnosis Date Noted   Peripheral polyneuropathy 05/25/2022   Chronic bilateral low back pain 05/25/2022   Mixed incontinence urge and stress 02/13/2020   Bilateral lower extremity edema 02/13/2020   Ganglion cyst of dorsum of right wrist 01/16/2018   Bilateral knee pain 04/17/2012   Pain of left heel 04/17/2012   Diabetes mellitus type 2 with neurological manifestations ([place]) 08/28/2009   THUMB PAIN, LEFT 08/27/2009   Hyperlipemia 08/20/2009   Morbid obesity ([place]) 08/20/2009   ANEMIA-NOS 08/20/2009   Essential hypertension 08/20/2009   ALLERGIC RHINITIS 08/20/2009   ASTHMA 08/20/2009   MENORRHAGIA, PERIMENOPAUSAL 08/20/2009   DEGENERATIVE JOINT DISEASE, KNEE 08/20/2009   TENDINITIS, LEFT THUMB 08/20/2009    REFERRING DIAG: M54.50,G89.29 (ICD-10-CM) - Chronic bilateral low back pain, unspecified whether sciatica present  THERAPY DIAG:  Other low back pain  Chronic pain of right knee  Chronic pain of left knee  Muscle weakness (generalized)  Other abnormalities of gait and  mobility  Rationale for Evaluation and Treatment Rehabilitation  PERTINENT HISTORY: HTN, DMII  PRECAUTIONS: None  SUBJECTIVE: Pt presents to PT with reports of no pain, does note some general fatigue. Has been compliant with HEP with no adverse effect. Pt is ready to begin PT at this time.   PAIN:  Are you having pain?  No: NPRS scale: 0/10 (6/10) Pain location: lower back Pain description: achy, tired Aggravating factors: prolonged standing Relieving factors: rest            Are you having pain?  No: NPRS scale: 0/10 (6/10) Pain location: bilateral knees Pain description: sharp Aggravating factors: stairs Relieving factors: rest, ice   OBJECTIVE: (objective measures completed at initial evaluation unless otherwise dated)  DIAGNOSTIC FINDINGS:  See imaging    PATIENT SURVEYS:  FOTO: 58% function - 07/20/2022   COGNITION:           Overall cognitive status: Within functional limits for tasks assessed               SENSATION: WFL   MUSCLE LENGTH: Thomas test: Right (-); Left (+)   POSTURE: rounded shoulders and increased lumbar lordosis and larger body habitus   PALPATION: TTP to lumbar paraspinals   LUMBAR ROM:    Active  A/PROM  eval  Flexion WFL  Extension 50% limited with p!  Right lateral flexion  Left lateral flexion    Right rotation 50% limited with p!  Left rotation 50% limited with p!   (Blank rows = not tested)   LOWER EXTREMITY ROM:      Active  Right eval Right  07/29/2022  Hip flexion      Hip extension      Hip abduction      Hip adduction      Hip internal rotation      Hip external rotation      Knee flexion 87 92  Knee extension      Ankle dorsiflexion      Ankle plantarflexion      Ankle inversion      Ankle eversion       (Blank rows = not tested)   LOWER EXTREMITY MMT:     MMT Right eval Left eval  Hip flexion 4/5 4/5  Hip extension      Hip abduction 4/5 4/5  Hip adduction 4/5 3+/5 p!  Hip internal rotation       Hip external rotation      Knee flexion 5/5 5/5  Knee extension 5/5 5/5  Ankle dorsiflexion      Ankle plantarflexion      Ankle inversion      Ankle eversion       (Blank rows = not tested)   LUMBAR SPECIAL TESTS:  Straight leg raise test: Negative and Slump test: Negative   FUNCTIONAL TESTS:  30 Second Sit to Stand: 7 reps   GAIT: Distance walked: 15f Assistive device utilized: None Level of assistance: Complete Independence Comments: decreased gait speed, trunk flexed      TODAY'S TREATMENT  [facility] Adult PT Treatment:                                                DATE: 08/03/2022 Therapeutic Exercise: NuStep lvl 6 UE/LE x 5 min while taking subjective STS 2x10 - 10# DB - high table Standing mini squat x 15 - UE support  Slant board calf stretch 2x30" Lateral walk x 4 laps RTB at counter Leg press 3x10 45# 2x10 Standing hip abd/ext 2x10 30# Step ups 2x10 fwd 8in  Seated knee ext 3x10 10# Seated hamstring curl 2x10 35#  [facility] Adult PT Treatment:                                                DATE: 07/29/2022 Therapeutic Exercise: NuStep lvl 6 UE/LE x 4 min while taking subjective STS 2x10 - 10# KB Supine quad sets x 10 - 5" hold - towel under heels Supine SLR x 15 each Supine heel slide with strap x 10 - 5" hold  Standing mini squat x 15 - UE support  Slant board calf stretch 2x30" Seated knee ext 3x10 10# Seated hamstring curl 2x10 35# Standing hip abd 2x10 25# Leg press 2x10 40# 2x10  [facility] Adult PT Treatment:                                                DATE: 07/22/2022 Therapeutic Exercise: NuStep  lvl 6 UE/LE x 3 min while taking subjective STS 2x10 - 10# KB LTR x 5 each Supine quad sets x 10 - 5" hold Supine SLR 2x10 each Supine pilates ring x 15 - 5" hold Supine hamstring stretch with strap x 60" each Modified thomas stretch with stool 2x60" L S/L clamshell 2x15 BTB  PATIENT EDUCATION:  Education details: HEP update Person educated:  Patient Education method: Explanation, Demonstration, and Handouts Education comprehension: verbalized understanding and returned demonstration     HOME EXERCISE PROGRAM: Access Code: [email or web address] URL: [email or web address] Date: 07/13/2022 Prepared by: Octavio Manns  Exercises - Standing Shoulder Row with Anchored Resistance  - 1 x daily - 7 x weekly - 3 sets - 10 reps - Supine Posterior Pelvic Tilt  - 1 x daily - 7 x weekly - 3 sets - 10 reps - 5 hold - Active Straight Leg Raise with Quad Set  - 1 x daily - 7 x weekly - 2-3 sets - 10 reps - Modified Thomas Stretch  - 1 x daily - 7 x weekly - 2 sets - 30 sec hold - Supine Bridge  - 1 x daily - 7 x weekly - 2-3 sets - 10 reps - Clamshell with Resistance  - 1 x daily - 7 x weekly - 2 sets - 10 reps - Step Up  - 1 x daily - 7 x weekly - 2 sets - 10 reps - Supine Heel Slide with Strap  - 1 x daily - 7 x weekly - 2 sets - 10 reps - 10 second hold   ASSESSMENT:   CLINICAL IMPRESSION: Pt was once again able to complete all prescribed exercises with no adverse effect or increase in pain. Therapy today continue to progress LE strength with emphasis on proximal hip. She is progressing well with therapy overall, noting decreased pain levels today. Will continue to progress as able per POC.    OBJECTIVE IMPAIRMENTS decreased activity tolerance, decreased balance, decreased endurance, decreased mobility, difficulty walking, decreased ROM, decreased strength, postural dysfunction, and pain    ACTIVITY LIMITATIONS lifting, standing, squatting, stairs, and transfers   PARTICIPATION LIMITATIONS: community activity and occupation   [place], Time since onset of injury/illness/exacerbation, and 1-2 comorbidities: HTN, DMII  are also affecting patient's functional outcome.      GOALS: Goals reviewed with patient? No   SHORT TERM GOALS: Target date: 07/08/2022   Pt will be compliant and knowledgeable with initial HEP for  improved comfort and carryover Baseline: initial HEP given  Goal status: MET   2.  Pt will self report lower back and knee pain no greater than 4/10 for improved comfort and functional ability Baseline: 6/10 at worst Goal status: MET   LONG TERM GOALS: Target date: 08/12/2022   Pt will self report lower back and knee pain no greater than 1-2/10 for improved comfort and functional ability Baseline: 6/10 at worst Goal status:ONGOING   2.  Pt will improve FOTO function score to no less than 43% as proxy for functional improvement Baseline: 56% function 07/20/2022: 58% function Goal status: MET    3.  Pt will increase 30 Second Sit to Stand rep count to no less than 10 reps for improved balance, strength, and functional mobility Baseline: 7 reps  07/20/2022: 8 reps Goal status: ONGOING   4.  Pt will improve bilateral knee flexion ROM to no less than 110 deg for improved comfort and functional mobility with home and community activities Baseline: see chart Goal  status: ONGOING   PLAN: PT FREQUENCY: 2x/week   PT DURATION: 8 weeks   PLANNED INTERVENTIONS: Therapeutic exercises, Therapeutic activity, Neuromuscular re-education, Balance training, Gait training, Patient/Family education, Self Care, Joint mobilization, Aquatic Therapy, Dry Needling, Electrical stimulation, Cryotherapy, Moist heat, Vasopneumatic device, Manual therapy, and Re-evaluation.   PLAN FOR NEXT SESSION: assess HEP response, core and proximal hip weakness,     Ward Chatters, PT 08/03/2022, 12:12 PM

## 2022-08-03 ENCOUNTER — Ambulatory Visit: Payer: Commercial Managed Care - HMO

## 2022-08-03 DIAGNOSIS — G8929 Other chronic pain: Secondary | ICD-10-CM

## 2022-08-03 DIAGNOSIS — M5459 Other low back pain: Secondary | ICD-10-CM | POA: Diagnosis not present

## 2022-08-03 DIAGNOSIS — R2689 Other abnormalities of gait and mobility: Secondary | ICD-10-CM

## 2022-08-03 DIAGNOSIS — M6281 Muscle weakness (generalized): Secondary | ICD-10-CM

## 2022-08-04 NOTE — Therapy (Signed)
OUTPATIENT PHYSICAL THERAPY TREATMENT NOTE   Patient Name: Jordan Ramirez MRN: [id number] DOB:07/22/1961, 61 y.o., female Today's Date: 08/05/2022  PCP: Martinique, Betty G, MD REFERRING PROVIDER: [place], Betty G, MD  END OF SESSION:   PT End of Session - 08/05/22 1140     Visit Number 11    Number of Visits 17    Date for PT Re-Evaluation 08/12/22    Authorization Type [employer]    PT Start Time 1137    PT Stop Time 1215    PT Time Calculation (min) 38 min    Activity Tolerance Patient tolerated treatment well    Behavior During Therapy WFL for tasks assessed/performed              Past Medical History:  Diagnosis Date   Arthritis    Chronic low back pain    Diabetes mellitus without complication ([place])    Hypertension    Obesity    Past Surgical History:  Procedure Laterality Date   PILONIDAL CYST EXCISION     WISDOM TOOTH EXTRACTION     Patient Active Problem List   Diagnosis Date Noted   Peripheral polyneuropathy 05/25/2022   Chronic bilateral low back pain 05/25/2022   Mixed incontinence urge and stress 02/13/2020   Bilateral lower extremity edema 02/13/2020   Ganglion cyst of dorsum of right wrist 01/16/2018   Bilateral knee pain 04/17/2012   Pain of left heel 04/17/2012   Diabetes mellitus type 2 with neurological manifestations ([place]) 08/28/2009   THUMB PAIN, LEFT 08/27/2009   Hyperlipemia 08/20/2009   Morbid obesity ([place]) 08/20/2009   ANEMIA-NOS 08/20/2009   Essential hypertension 08/20/2009   ALLERGIC RHINITIS 08/20/2009   ASTHMA 08/20/2009   MENORRHAGIA, PERIMENOPAUSAL 08/20/2009   DEGENERATIVE JOINT DISEASE, KNEE 08/20/2009   TENDINITIS, LEFT THUMB 08/20/2009    REFERRING DIAG: M54.50,G89.29 (ICD-10-CM) - Chronic bilateral low back pain, unspecified whether sciatica present  THERAPY DIAG:  Other low back pain  Other abnormalities of gait and mobility  Chronic pain of right knee  Chronic pain of left knee  Muscle weakness  (generalized)  Rationale for Evaluation and Treatment Rehabilitation  PERTINENT HISTORY: HTN, DMII  PRECAUTIONS: None  SUBJECTIVE: Patient reports mild pain in her L knee today, but none in R knee or lower back.  PAIN:  Are you having pain?  No: NPRS scale: 0/10 (6/10) Pain location: lower back Pain description: achy, tired Aggravating factors: prolonged standing Relieving factors: rest            Are you having pain?  No: NPRS scale: 3/10 (6/10) (L knee today) Pain location: bilateral knees Pain description: sharp Aggravating factors: stairs Relieving factors: rest, ice   OBJECTIVE: (objective measures completed at initial evaluation unless otherwise dated)  DIAGNOSTIC FINDINGS:  See imaging    PATIENT SURVEYS:  FOTO: 58% function - 07/20/2022   COGNITION:           Overall cognitive status: Within functional limits for tasks assessed               SENSATION: WFL   MUSCLE LENGTH: Thomas test: Right (-); Left (+)   POSTURE: rounded shoulders and increased lumbar lordosis and larger body habitus   PALPATION: TTP to lumbar paraspinals   LUMBAR ROM:    Active  A/PROM  eval  Flexion WFL  Extension 50% limited with p!  Right lateral flexion    Left lateral flexion    Right rotation 50% limited with p!  Left rotation 50% limited  with p!   (Blank rows = not tested)   LOWER EXTREMITY ROM:      Active  Right eval Right  07/29/2022  Hip flexion      Hip extension      Hip abduction      Hip adduction      Hip internal rotation      Hip external rotation      Knee flexion 87 92  Knee extension      Ankle dorsiflexion      Ankle plantarflexion      Ankle inversion      Ankle eversion       (Blank rows = not tested)   LOWER EXTREMITY MMT:     MMT Right eval Left eval  Hip flexion 4/5 4/5  Hip extension      Hip abduction 4/5 4/5  Hip adduction 4/5 3+/5 p!  Hip internal rotation      Hip external rotation      Knee flexion 5/5 5/5  Knee  extension 5/5 5/5  Ankle dorsiflexion      Ankle plantarflexion      Ankle inversion      Ankle eversion       (Blank rows = not tested)   LUMBAR SPECIAL TESTS:  Straight leg raise test: Negative and Slump test: Negative   FUNCTIONAL TESTS:  30 Second Sit to Stand: 7 reps   GAIT: Distance walked: 68f Assistive device utilized: None Level of assistance: Complete Independence Comments: decreased gait speed, trunk flexed      TODAY'S TREATMENT  [facility] Adult PT Treatment:                                                DATE: 08/05/2022 Therapeutic Exercise: NuStep lvl 6 UE/LE x 5 min while taking subjective Slant board calf stretch 2x1' Standing mini squat 2x10 - UE support  Step ups 2x10 fwd 8in BIL Heel raises 3x10 Toe raises back against wall 3x10 Lateral walk x 4 laps RTB at counter Seated knee ext 3x10 10# Seated hamstring curl 2x10 35#  [facility] Adult PT Treatment:                                                DATE: 08/03/2022 Therapeutic Exercise: NuStep lvl 6 UE/LE x 5 min while taking subjective STS 2x10 - 10# DB - high table Standing mini squat x 15 - UE support  Slant board calf stretch 2x30" Lateral walk x 4 laps RTB at counter Leg press 3x10 45# 2x10 Standing hip abd/ext 2x10 30# Step ups 2x10 fwd 8in  Seated knee ext 3x10 10# Seated hamstring curl 2x10 35#  [facility] Adult PT Treatment:                                                DATE: 07/29/2022 Therapeutic Exercise: NuStep lvl 6 UE/LE x 4 min while taking subjective STS 2x10 - 10# KB Supine quad sets x 10 - 5" hold - towel under heels Supine SLR x 15 each Supine heel slide with strap x 10 -  5" hold  Standing mini squat x 15 - UE support  Slant board calf stretch 2x30" Seated knee ext 3x10 10# Seated hamstring curl 2x10 35# Standing hip abd 2x10 25# Leg press 2x10 40# 2x10   PATIENT EDUCATION:  Education details: HEP update Person educated: Patient Education method: Explanation, Demonstration, and  Handouts Education comprehension: verbalized understanding and returned demonstration     HOME EXERCISE PROGRAM: Access Code: [email or web address] URL: [email or web address] Date: 07/13/2022 Prepared by: Octavio Manns  Exercises - Standing Shoulder Row with Anchored Resistance  - 1 x daily - 7 x weekly - 3 sets - 10 reps - Supine Posterior Pelvic Tilt  - 1 x daily - 7 x weekly - 3 sets - 10 reps - 5 hold - Active Straight Leg Raise with Quad Set  - 1 x daily - 7 x weekly - 2-3 sets - 10 reps - Modified Thomas Stretch  - 1 x daily - 7 x weekly - 2 sets - 30 sec hold - Supine Bridge  - 1 x daily - 7 x weekly - 2-3 sets - 10 reps - Clamshell with Resistance  - 1 x daily - 7 x weekly - 2 sets - 10 reps - Step Up  - 1 x daily - 7 x weekly - 2 sets - 10 reps - Supine Heel Slide with Strap  - 1 x daily - 7 x weekly - 2 sets - 10 reps - 10 second hold   ASSESSMENT:   CLINICAL IMPRESSION: Patient presents to PT with mild pain in her L knee and no current pain in R knee or lower back. Session today focused on improving BIL LE and proximal hip strength. Patient was able to tolerate all prescribed exercises with no adverse effects. Patient continues to benefit from skilled PT services and should be progressed as able to improve functional independence.     OBJECTIVE IMPAIRMENTS decreased activity tolerance, decreased balance, decreased endurance, decreased mobility, difficulty walking, decreased ROM, decreased strength, postural dysfunction, and pain    ACTIVITY LIMITATIONS lifting, standing, squatting, stairs, and transfers   PARTICIPATION LIMITATIONS: community activity and occupation   [place], Time since onset of injury/illness/exacerbation, and 1-2 comorbidities: HTN, DMII  are also affecting patient's functional outcome.      GOALS: Goals reviewed with patient? No   SHORT TERM GOALS: Target date: 07/08/2022   Pt will be compliant and knowledgeable with initial HEP  for improved comfort and carryover Baseline: initial HEP given  Goal status: MET   2.  Pt will self report lower back and knee pain no greater than 4/10 for improved comfort and functional ability Baseline: 6/10 at worst Goal status: MET   LONG TERM GOALS: Target date: 08/12/2022   Pt will self report lower back and knee pain no greater than 1-2/10 for improved comfort and functional ability Baseline: 6/10 at worst Goal status:ONGOING   2.  Pt will improve FOTO function score to no less than 43% as proxy for functional improvement Baseline: 56% function 07/20/2022: 58% function Goal status: MET    3.  Pt will increase 30 Second Sit to Stand rep count to no less than 10 reps for improved balance, strength, and functional mobility Baseline: 7 reps  07/20/2022: 8 reps Goal status: ONGOING   4.  Pt will improve bilateral knee flexion ROM to no less than 110 deg for improved comfort and functional mobility with home and community activities Baseline: see chart Goal status: ONGOING   PLAN: PT  FREQUENCY: 2x/week   PT DURATION: 8 weeks   PLANNED INTERVENTIONS: Therapeutic exercises, Therapeutic activity, Neuromuscular re-education, Balance training, Gait training, Patient/Family education, Self Care, Joint mobilization, Aquatic Therapy, Dry Needling, Electrical stimulation, Cryotherapy, Moist heat, Vasopneumatic device, Manual therapy, and Re-evaluation.   PLAN FOR NEXT SESSION: assess HEP response, core and proximal hip weakness,     Margarette Canada, PTA 08/05/2022, 12:14 PM

## 2022-08-05 ENCOUNTER — Ambulatory Visit: Payer: Commercial Managed Care - HMO

## 2022-08-05 DIAGNOSIS — M5459 Other low back pain: Secondary | ICD-10-CM

## 2022-08-05 DIAGNOSIS — R2689 Other abnormalities of gait and mobility: Secondary | ICD-10-CM

## 2022-08-05 DIAGNOSIS — G8929 Other chronic pain: Secondary | ICD-10-CM

## 2022-08-05 DIAGNOSIS — M6281 Muscle weakness (generalized): Secondary | ICD-10-CM

## 2022-08-10 ENCOUNTER — Ambulatory Visit: Payer: Commercial Managed Care - HMO

## 2022-08-10 DIAGNOSIS — G8929 Other chronic pain: Secondary | ICD-10-CM

## 2022-08-10 DIAGNOSIS — M6281 Muscle weakness (generalized): Secondary | ICD-10-CM

## 2022-08-10 DIAGNOSIS — M5459 Other low back pain: Secondary | ICD-10-CM | POA: Diagnosis not present

## 2022-08-10 DIAGNOSIS — R2689 Other abnormalities of gait and mobility: Secondary | ICD-10-CM

## 2022-08-10 NOTE — Therapy (Signed)
OUTPATIENT PHYSICAL THERAPY TREATMENT NOTE   Patient Name: Jordan Ramirez MRN: [id number] DOB:[birth year], 61 y.o., female Today's Date: 08/10/2022  PCP: Martinique, Betty G, MD REFERRING PROVIDER: Martinique, Betty G, MD  END OF SESSION:   PT End of Session - 08/10/22 1133     Visit Number 12    Number of Visits 17    Date for PT Re-Evaluation 08/12/22    Authorization Type [employer]    PT Start Time 1131    PT Stop Time 1212    PT Time Calculation (min) 41 min    Activity Tolerance Patient tolerated treatment well    Behavior During Therapy WFL for tasks assessed/performed               Past Medical History:  Diagnosis Date   Arthritis    Chronic low back pain    Diabetes mellitus without complication ([place])    Hypertension    Obesity    Past Surgical History:  Procedure Laterality Date   PILONIDAL CYST EXCISION     WISDOM TOOTH EXTRACTION     Patient Active Problem List   Diagnosis Date Noted   Peripheral polyneuropathy 05/25/2022   Chronic bilateral low back pain 05/25/2022   Mixed incontinence urge and stress 02/13/2020   Bilateral lower extremity edema 02/13/2020   Ganglion cyst of dorsum of right wrist 01/16/2018   Bilateral knee pain 04/17/2012   Pain of left heel 04/17/2012   Diabetes mellitus type 2 with neurological manifestations ([place]) 08/28/2009   THUMB PAIN, LEFT 08/27/2009   Hyperlipemia 08/20/2009   Morbid obesity ([place]) 08/20/2009   ANEMIA-NOS 08/20/2009   Essential hypertension 08/20/2009   ALLERGIC RHINITIS 08/20/2009   ASTHMA 08/20/2009   MENORRHAGIA, PERIMENOPAUSAL 08/20/2009   DEGENERATIVE JOINT DISEASE, KNEE 08/20/2009   TENDINITIS, LEFT THUMB 08/20/2009    REFERRING DIAG: M54.50,G89.29 (ICD-10-CM) - Chronic bilateral low back pain, unspecified whether sciatica present  THERAPY DIAG:  Other low back pain  Other abnormalities of gait and mobility  Chronic pain of right knee  Chronic pain of left knee  Muscle weakness  (generalized)  Rationale for Evaluation and Treatment Rehabilitation  PERTINENT HISTORY: HTN, DMII  PRECAUTIONS: None  SUBJECTIVE: Pt presents to PT with reports of knee pain. Has been compliant with HEP with no adverse effect. Pt is ready to begin PT at this time.   PAIN:  Are you having pain?  No: NPRS scale: 0/10 (6/10) Pain location: lower back Pain description: achy, tired Aggravating factors: prolonged standing Relieving factors: rest            Are you having pain?  Yes: NPRS scale: 3/10 (6/10) (L knee today) Pain location: bilateral knees Pain description: sharp Aggravating factors: stairs Relieving factors: rest, ice   OBJECTIVE: (objective measures completed at initial evaluation unless otherwise dated)  DIAGNOSTIC FINDINGS:  See imaging    PATIENT SURVEYS:  FOTO: 58% function - 07/20/2022   COGNITION:           Overall cognitive status: Within functional limits for tasks assessed               SENSATION: WFL   MUSCLE LENGTH: Thomas test: Right (-); Left (+)   POSTURE: rounded shoulders and increased lumbar lordosis and larger body habitus   PALPATION: TTP to lumbar paraspinals   LUMBAR ROM:    Active  A/PROM  eval  Flexion WFL  Extension 50% limited with p!  Right lateral flexion    Left lateral flexion  Right rotation 50% limited with p!  Left rotation 50% limited with p!   (Blank rows = not tested)   LOWER EXTREMITY ROM:      Active  Right eval Right  07/29/2022  Hip flexion      Hip extension      Hip abduction      Hip adduction      Hip internal rotation      Hip external rotation      Knee flexion 87 92  Knee extension      Ankle dorsiflexion      Ankle plantarflexion      Ankle inversion      Ankle eversion       (Blank rows = not tested)   LOWER EXTREMITY MMT:     MMT Right eval Left eval  Hip flexion 4/5 4/5  Hip extension      Hip abduction 4/5 4/5  Hip adduction 4/5 3+/5 p!  Hip internal rotation      Hip  external rotation      Knee flexion 5/5 5/5  Knee extension 5/5 5/5  Ankle dorsiflexion      Ankle plantarflexion      Ankle inversion      Ankle eversion       (Blank rows = not tested)   LUMBAR SPECIAL TESTS:  Straight leg raise test: Negative and Slump test: Negative   FUNCTIONAL TESTS:  30 Second Sit to Stand: 7 reps   GAIT: Distance walked: 39f Assistive device utilized: None Level of assistance: Complete Independence Comments: decreased gait speed, trunk flexed      TODAY'S TREATMENT  [facility] Adult PT Treatment:                                                DATE: 08/10/2022 Therapeutic Exercise: NuStep lvl 6 UE/LE x 4 min while taking subjective Slant board calf stretch 2x60" Standing mini squat x 10 - UE support  Seated knee ext 3x10 15# Seated hamstring curl 2x10 45# Step ups x 10 fwd 8in BIL Lateral walk x 3 laps GTB at counter Standing hip abd/ext 2x10 30# Leg press 3x10 50# 2x10 Seated hamstring stretch x 30" each Supine hamstring stretch with strap 2x30"   [facility] Adult PT Treatment:                                                DATE: 08/05/2022 Therapeutic Exercise: NuStep lvl 6 UE/LE x 5 min while taking subjective Slant board calf stretch 2x1' Standing mini squat 2x10 - UE support  Step ups 2x10 fwd 8in BIL Heel raises 3x10 Toe raises back against wall 3x10 Lateral walk x 4 laps RTB at counter Seated knee ext 3x10 10# Seated hamstring curl 2x10 35#  [facility] Adult PT Treatment:                                                DATE: 08/03/2022 Therapeutic Exercise: NuStep lvl 6 UE/LE x 5 min while taking subjective STS 2x10 - 10# DB - high table Standing mini squat x  15 - UE support  Slant board calf stretch 2x30" Lateral walk x 4 laps RTB at counter Leg press 3x10 45# 2x10 Standing hip abd/ext 2x10 30# Step ups 2x10 fwd 8in  Seated knee ext 3x10 10# Seated hamstring curl 2x10 35#  [facility] Adult PT Treatment:                                                 DATE: 07/29/2022 Therapeutic Exercise: NuStep lvl 6 UE/LE x 4 min while taking subjective STS 2x10 - 10# KB Supine quad sets x 10 - 5" hold - towel under heels Supine SLR x 15 each Supine heel slide with strap x 10 - 5" hold  Standing mini squat x 15 - UE support  Slant board calf stretch 2x30" Seated knee ext 3x10 10# Seated hamstring curl 2x10 35# Standing hip abd 2x10 25# Leg press 2x10 40# 2x10   PATIENT EDUCATION:  Education details: HEP update Person educated: Patient Education method: Explanation, Demonstration, and Handouts Education comprehension: verbalized understanding and returned demonstration     HOME EXERCISE PROGRAM: Access Code: [email or web address] URL: [email or web address] Date: 07/13/2022 Prepared by: Octavio Manns  Exercises - Standing Shoulder Row with Anchored Resistance  - 1 x daily - 7 x weekly - 3 sets - 10 reps - Supine Posterior Pelvic Tilt  - 1 x daily - 7 x weekly - 3 sets - 10 reps - 5 hold - Active Straight Leg Raise with Quad Set  - 1 x daily - 7 x weekly - 2-3 sets - 10 reps - Modified Thomas Stretch  - 1 x daily - 7 x weekly - 2 sets - 30 sec hold - Supine Bridge  - 1 x daily - 7 x weekly - 2-3 sets - 10 reps - Clamshell with Resistance  - 1 x daily - 7 x weekly - 2 sets - 10 reps - Step Up  - 1 x daily - 7 x weekly - 2 sets - 10 reps - Supine Heel Slide with Strap  - 1 x daily - 7 x weekly - 2 sets - 10 reps - 10 second hold   ASSESSMENT:   CLINICAL IMPRESSION: Pt was once again able to complete all prescribed exercises with no adverse effect or increase in pain. Therapy today continue to progress LE strength with emphasis on proximal hip. She is progressing well with therapy overall, noting decreased pain levels today. Will continue to progress as able per POC.     OBJECTIVE IMPAIRMENTS decreased activity tolerance, decreased balance, decreased endurance, decreased mobility, difficulty walking, decreased ROM, decreased strength,  postural dysfunction, and pain    ACTIVITY LIMITATIONS lifting, standing, squatting, stairs, and transfers   PARTICIPATION LIMITATIONS: community activity and occupation   [place], Time since onset of injury/illness/exacerbation, and 1-2 comorbidities: HTN, DMII  are also affecting patient's functional outcome.      GOALS: Goals reviewed with patient? No   SHORT TERM GOALS: Target date: 07/08/2022   Pt will be compliant and knowledgeable with initial HEP for improved comfort and carryover Baseline: initial HEP given  Goal status: MET   2.  Pt will self report lower back and knee pain no greater than 4/10 for improved comfort and functional ability Baseline: 6/10 at worst Goal status: MET   LONG TERM GOALS: Target date: 08/12/2022   Pt will  self report lower back and knee pain no greater than 1-2/10 for improved comfort and functional ability Baseline: 6/10 at worst Goal status:ONGOING   2.  Pt will improve FOTO function score to no less than 43% as proxy for functional improvement Baseline: 56% function 07/20/2022: 58% function Goal status: MET    3.  Pt will increase 30 Second Sit to Stand rep count to no less than 10 reps for improved balance, strength, and functional mobility Baseline: 7 reps  07/20/2022: 8 reps Goal status: ONGOING   4.  Pt will improve bilateral knee flexion ROM to no less than 110 deg for improved comfort and functional mobility with home and community activities Baseline: see chart Goal status: ONGOING   PLAN: PT FREQUENCY: 2x/week   PT DURATION: 8 weeks   PLANNED INTERVENTIONS: Therapeutic exercises, Therapeutic activity, Neuromuscular re-education, Balance training, Gait training, Patient/Family education, Self Care, Joint mobilization, Aquatic Therapy, Dry Needling, Electrical stimulation, Cryotherapy, Moist heat, Vasopneumatic device, Manual therapy, and Re-evaluation.   PLAN FOR NEXT SESSION: assess HEP response, core and  proximal hip weakness,     Ward Chatters, PT 08/10/2022, 12:12 PM

## 2022-08-12 ENCOUNTER — Ambulatory Visit: Payer: Commercial Managed Care - HMO

## 2022-08-18 ENCOUNTER — Ambulatory Visit: Payer: Commercial Managed Care - HMO | Attending: Family Medicine

## 2022-08-18 DIAGNOSIS — R2689 Other abnormalities of gait and mobility: Secondary | ICD-10-CM | POA: Diagnosis present

## 2022-08-18 DIAGNOSIS — M5459 Other low back pain: Secondary | ICD-10-CM

## 2022-08-18 NOTE — Therapy (Signed)
OUTPATIENT PHYSICAL THERAPY TREATMENT NOTE/DISCHARGE   Patient Name: Jordan Ramirez MRN: [id number] DOB:01-Jan-1961, 61 y.o., female Today's Date: 08/18/2022  PCP: Martinique, Betty G, MD REFERRING PROVIDER: Martinique, Betty G, MD  END OF SESSION:   PT End of Session - 08/18/22 1219     Visit Number 13    Number of Visits 17    Date for PT Re-Evaluation 08/12/22    Authorization Type [employer]    PT Start Time 1220    PT Stop Time 8588    PT Time Calculation (min) 38 min    Activity Tolerance Patient tolerated treatment well    Behavior During Therapy WFL for tasks assessed/performed                Past Medical History:  Diagnosis Date   Arthritis    Chronic low back pain    Diabetes mellitus without complication ([place])    Hypertension    Obesity    Past Surgical History:  Procedure Laterality Date   PILONIDAL CYST EXCISION     WISDOM TOOTH EXTRACTION     Patient Active Problem List   Diagnosis Date Noted   Peripheral polyneuropathy 05/25/2022   Chronic bilateral low back pain 05/25/2022   Mixed incontinence urge and stress 02/13/2020   Bilateral lower extremity edema 02/13/2020   Ganglion cyst of dorsum of right wrist 01/16/2018   Bilateral knee pain 04/17/2012   Pain of left heel 04/17/2012   Diabetes mellitus type 2 with neurological manifestations ([place]) 08/28/2009   THUMB PAIN, LEFT 08/27/2009   Hyperlipemia 08/20/2009   Morbid obesity ([place]) 08/20/2009   ANEMIA-NOS 08/20/2009   Essential hypertension 08/20/2009   ALLERGIC RHINITIS 08/20/2009   ASTHMA 08/20/2009   MENORRHAGIA, PERIMENOPAUSAL 08/20/2009   DEGENERATIVE JOINT DISEASE, KNEE 08/20/2009   TENDINITIS, LEFT THUMB 08/20/2009    REFERRING DIAG: M54.50,G89.29 (ICD-10-CM) - Chronic bilateral low back pain, unspecified whether sciatica present  THERAPY DIAG:  Other low back pain  Other abnormalities of gait and mobility  Rationale for Evaluation and Treatment Rehabilitation  PERTINENT HISTORY: HTN,  DMII  PRECAUTIONS: None  SUBJECTIVE: Pt presents to PT with no current pain, feels like she is doing well. She has been compliant with HEP with no adverse effect. She is ready to begin PT at this time.   PAIN:  Are you having pain?  No: NPRS scale: 0/10 (6/10) Pain location: lower back Pain description: achy, tired Aggravating factors: prolonged standing Relieving factors: rest            Are you having pain?  Yes: NPRS scale: 3/10 (6/10) (L knee today) Pain location: bilateral knees Pain description: sharp Aggravating factors: stairs Relieving factors: rest, ice   OBJECTIVE: (objective measures completed at initial evaluation unless otherwise dated)  DIAGNOSTIC FINDINGS:  See imaging    PATIENT SURVEYS:  FOTO: 58% function - 07/20/2022   COGNITION:           Overall cognitive status: Within functional limits for tasks assessed               SENSATION: WFL   MUSCLE LENGTH: Thomas test: Right (-); Left (+)   POSTURE: rounded shoulders and increased lumbar lordosis and larger body habitus   PALPATION: TTP to lumbar paraspinals   LUMBAR ROM:    Active  A/PROM  eval  Flexion WFL  Extension 50% limited with p!  Right lateral flexion    Left lateral flexion    Right rotation 50% limited with p!  Left  rotation 50% limited with p!   (Blank rows = not tested)   LOWER EXTREMITY ROM:      Active  Right eval Right  07/29/2022 Right 08/18/2022  Hip flexion       Hip extension       Hip abduction       Hip adduction       Hip internal rotation       Hip external rotation       Knee flexion 87 92 95  Knee extension       Ankle dorsiflexion       Ankle plantarflexion       Ankle inversion       Ankle eversion        (Blank rows = not tested)   LOWER EXTREMITY MMT:     MMT Right eval Left eval  Hip flexion 4/5 4/5  Hip extension      Hip abduction 4/5 4/5  Hip adduction 4/5 3+/5 p!  Hip internal rotation      Hip external rotation      Knee flexion  5/5 5/5  Knee extension 5/5 5/5  Ankle dorsiflexion      Ankle plantarflexion      Ankle inversion      Ankle eversion       (Blank rows = not tested)   LUMBAR SPECIAL TESTS:  Straight leg raise test: Negative and Slump test: Negative   FUNCTIONAL TESTS:  30 Second Sit to Stand: 7 reps   GAIT: Distance walked: 61f Assistive device utilized: None Level of assistance: Complete [place] Comments: decreased gait speed, trunk flexed      TODAY'S TREATMENT  [facility] Adult PT Treatment:                                                DATE: 08/18/2022 Therapeutic Exercise: Supine PPT x 10 - 5" hold Supine SLR x 15 Bridge x 10 Step ups x 10 fwd 8in BIL Lateral walk x 3 laps RTB at counter Standing hip abd/ext 2x10 RTB STS x 10 S/L clamshell x 15 BTB each Standing hip abd/ext x 15 RTB  [facility] Adult PT Treatment:                                                DATE: 08/10/2022 Therapeutic Exercise: NuStep lvl 6 UE/LE x 4 min while taking subjective Slant board calf stretch 2x60" Standing mini squat x 10 - UE support  Seated knee ext 3x10 15# Seated hamstring curl 2x10 45# Step ups x 10 fwd 8in BIL Lateral walk x 3 laps GTB at counter Standing hip abd/ext 2x10 30# Leg press 3x10 50# 2x10 Seated hamstring stretch x 30" each Supine hamstring stretch with strap 2x30"   [facility] Adult PT Treatment:                                                DATE: 08/05/2022 Therapeutic Exercise: NuStep lvl 6 UE/LE x 5 min while taking subjective Slant board calf stretch 2x1' Standing mini squat 2x10 - UE support  Step ups 2x10 fwd 8in BIL Heel raises 3x10 Toe raises back against wall 3x10 Lateral walk x 4 laps RTB at counter Seated knee ext 3x10 10# Seated hamstring curl 2x10 35#  PATIENT EDUCATION:  Education details: HEP update Person educated: Patient Education method: Explanation, Demonstration, and Handouts Education comprehension: verbalized understanding and returned demonstration      HOME EXERCISE PROGRAM: Supine Posterior Pelvic Tilt  - 1 x daily - 7 x weekly - 3 sets - 10 reps - 5 hold - Active Straight Leg Raise with Quad Set  - 1 x daily - 7 x weekly - 2-3 sets - 15 reps - Supine Bridge  - 1 x daily - 7 x weekly - 2-3 sets - 10 reps - Modified Thomas Stretch  - 1 x daily - 7 x weekly - 2 sets - 30 sec hold - Supine Heel Slide with Strap  - 1 x daily - 7 x weekly - 2 sets - 10 reps - 10 second hold - Clamshell with Resistance  - 1 x daily - 7 x weekly - 2 sets - 15 reps - Sit to Stand Without Arm Support  - 1 x daily - 7 x weekly - 3 sets - 10 reps - Step Up  - 1 x daily - 7 x weekly - 2 sets - 10 reps - Side Stepping with Resistance at Ankles and Counter Support  - 1 x daily - 7 x weekly - 4 reps - red tband hold - Standing Hip Abduction with Resistance at Ankles and Counter Support  - 1 x daily - 7 x weekly - 3 sets - 15 reps - red tband hold - Standing Hip Extension with Counter Support  - 1 x daily - 7 x weekly - 3 sets - 15 reps - red tband hold - Gastroc Stretch on Wall  - 1 x daily - 7 x weekly - 2 reps - 30 sec hold   ASSESSMENT:   CLINICAL IMPRESSION: Pt was able to complete all prescribed exercises and demonstrated knowledge of HEP with no adverse effect. Over the course of PT treatment she has progressed very well, showing subjective functional improvement via FOTO and improved 30 Second Sit to Stand for increase in functional mobility. She should continue to improve with HEP compliance and no longer requires skilled PT services at this time.     OBJECTIVE IMPAIRMENTS decreased activity tolerance, decreased balance, decreased endurance, decreased mobility, difficulty walking, decreased ROM, deceased strength, postural dysfunction, and pain    ACTIVITY LIMITATIONS lifting, standing, squatting, stairs, and transfers   PARTICIPATION LIMITATIONS: community activity and occupation   [place], Time since onset of injury/illness/exacerbation,  and 1-2 comorbidities: HTN, DMII  are also affecting patient's functional outcome.      GOALS: Goals reviewed with patient? No   SHORT TERM GOALS: Target date: 07/08/2022   Pt will be compliant and knowledgeable with initial HEP for improved comfort and carryover Baseline: initial HEP given  Goal status: MET   2.  Pt will self report lower back and knee pain no greater than 4/10 for improved comfort and functional ability Baseline: 6/10 at worst Goal status: MET   LONG TERM GOALS: Target date: 08/12/2022   Pt will self report lower back and knee pain no greater than 1-2/10 for improved comfort and functional ability Baseline: 6/10 at worst Goal status: MET   2.  Pt will improve FOTO function score to no less than 43% as proxy for  functional improvement Baseline: 56% function 07/20/2022: 58% function Goal status: MET    3.  Pt will increase 30 Second Sit to Stand rep count to no less than 10 reps for improved balance, strength, and functional mobility Baseline: 7 reps  07/20/2022: 8 reps 08/18/2022: 9 reps Goal status: MET   4.  Pt will improve bilateral knee flexion ROM to no less than 110 deg for improved comfort and functional mobility with home and community activities Baseline: see chart Goal status: NOT MET; IMPROVED   PLAN: PT FREQUENCY: 2x/week   PT DURATION: 8 weeks   PLANNED INTERVENTIONS: Therapeutic exercises, Therapeutic activity, Neuromuscular re-education, Balance training, Gait training, Patient/Family education, Self Care, Joint mobilization, Aquatic Therapy, Dry Needling, Electrical stimulation, Cryotherapy, Moist heat, Vasopneumatic device, Manual therapy, and Re-evaluation.   PLAN FOR NEXT SESSION: assess HEP response, core and proximal hip weakness,     Ward Chatters, PT 08/18/2022, 1:53 PM

## 2022-08-30 NOTE — Progress Notes (Signed)
This encounter was created in error - please disregard.

## 2022-09-28 NOTE — Progress Notes (Deleted)
HPI: [name] is a 61 y.o. female, who is here today for chronic disease management.  Last seen on 05/25/22.   Hyperlipidemia: Currently on Crestor 10 mg daily. Following a low fat diet: ***. Side effects from medication:*** Lab Results  Component Value Date   CHOL 233 (H) 02/09/2022   HDL 59.70 02/09/2022   LDLCALC 149 (H) 02/09/2022   LDLDIRECT 118.5 04/17/2012   TRIG 122.0 02/09/2022   CHOLHDL 4 02/09/2022    Hypertension:  Medications: Amlodipine 10 mg daily and Lisinopril 20 mg daily. BP readings at home:*** Side effects:***  Negative for unusual or severe headache, visual changes, exertional chest pain, dyspnea,  focal weakness, or edema.  Lab Results  Component Value Date   CREATININE 1.01 05/25/2022   BUN 13 05/25/2022   NA 140 05/25/2022   K 3.5 05/25/2022   CL 103 05/25/2022   CO2 28 05/25/2022    Diabetes Mellitus II:  - Checking BG at home: *** - Medications: Trulicity 1.5 mg weekly, Metformin 500 mg 1 tablet in the morning & 2 at supper. - Compliance: *** - Diet: *** - Exercise: *** - eye exam: *** - foot exam: *** - Negative for symptoms of hypoglycemia, polyuria, polydipsia, numbness extremities, foot ulcers/trauma  Lab Results  Component Value Date   HGBA1C 7.3 (A) 05/25/2022   Lab Results  Component Value Date   MICROALBUR 5.1 (H) 02/09/2022    Review of Systems See other pertinent positives and negatives in HPI.  Current Outpatient Medications on File Prior to Visit  Medication Sig Dispense Refill   amLODipine (NORVASC) 10 MG tablet Take 1 tablet (10 mg total) by mouth daily. 90 tablet 2   Blood Glucose Monitoring Suppl (ONETOUCH VERIO) w/Device KIT Use to test blood sugars once daily. 1 kit 1   Cholecalciferol (VITAMIN D3 PO) Take 1 tablet by mouth daily.     Dulaglutide (TRULICITY) 1.5 AY/3.0ZS SOPN Inject 1.5 mg into the skin once a week. 6 mL 2   furosemide (LASIX) 20 MG tablet Take 1 tablet (20 mg total) by mouth  daily as needed. (Patient taking differently: Take 20 mg by mouth daily as needed for edema or fluid.) 90 tablet 0   glucose blood (ONETOUCH VERIO) test strip Use to test blood sugars once daily. 100 each 12   HYDROcodone-acetaminophen (NORCO/VICODIN) 5-325 MG tablet Take 1 tablet by mouth every 6 (six) hours as needed. 12 tablet 0   Lancets (ONETOUCH ULTRASOFT) lancets Use to test blood sugars once daily. 100 each 12   lidocaine (LIDODERM) 5 % Place 1 patch onto the skin daily. Remove & Discard patch within 12 hours or as directed by MD 30 patch 0   lisinopril (ZESTRIL) 20 MG tablet Take 1 tablet (20 mg total) by mouth daily. 90 tablet 2   metFORMIN (GLUCOPHAGE-XR) 500 MG 24 hr tablet Take 1500 mg by mouth daily (500 mg with breakfast and 1000 mg with supper) (Patient taking differently: Take 500 mg by mouth daily. Take 1500 mg by mouth daily (500 mg with breakfast and 1000 mg with supper)) 270 tablet 1   Multiple Vitamins-Minerals (ZINC PO) Take 1 tablet by mouth daily.     rosuvastatin (CRESTOR) 10 MG tablet Take 1 tablet (10 mg total) by mouth daily. 90 tablet 3   No current facility-administered medications on file prior to visit.    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 There is no height or weight on file to calculate BMI.  Physical Exam  ASSESSMENT AND PLAN:  There are no diagnoses linked to this encounter.  No orders of the defined types were placed in this encounter.   No problem-specific Assessment & Plan notes found for this encounter.   No follow-ups on file.  Betty G. Martinique, MD  [facility]. [place] office.

## 2022-09-29 ENCOUNTER — Ambulatory Visit: Payer: Commercial Managed Care - HMO | Admitting: Family Medicine

## 2022-09-29 DIAGNOSIS — E1149 Type 2 diabetes mellitus with other diabetic neurological complication: Secondary | ICD-10-CM

## 2022-09-29 DIAGNOSIS — E785 Hyperlipidemia, unspecified: Secondary | ICD-10-CM

## 2022-09-29 DIAGNOSIS — I10 Essential (primary) hypertension: Secondary | ICD-10-CM

## 2023-01-27 ENCOUNTER — Encounter: Payer: Self-pay | Admitting: Family Medicine

## 2023-01-27 ENCOUNTER — Ambulatory Visit (INDEPENDENT_AMBULATORY_CARE_PROVIDER_SITE_OTHER): Payer: 59 | Admitting: Family Medicine

## 2023-01-27 VITALS — BP 143/74 | HR 93 | Temp 98.0°F | Ht 67.0 in | Wt 246.0 lb

## 2023-01-27 DIAGNOSIS — S30810A Abrasion of lower back and pelvis, initial encounter: Secondary | ICD-10-CM | POA: Diagnosis not present

## 2023-01-27 DIAGNOSIS — I10 Essential (primary) hypertension: Secondary | ICD-10-CM | POA: Diagnosis not present

## 2023-01-27 DIAGNOSIS — T148XXA Other injury of unspecified body region, initial encounter: Secondary | ICD-10-CM

## 2023-01-27 MED ORDER — MUPIROCIN 2 % EX OINT: 1.0000 | TOPICAL_OINTMENT | Freq: Two times a day (BID) | CUTANEOUS | 1 refills | Status: DC

## 2023-01-27 NOTE — Patient Instructions (Addendum)
Try to avoid wiping with rough paper - consider rinsing with peri-bottle after using the bathroom and gently pat dry Keep clean Use mupirocin ointment twice day Can also try barrier cream/diaper cream as well  Please let us know for any signs of worsening infection so we can change to oral antibiotics if needed - increased pain, redness, swelling, induration (hard knot), excess drainage, fevers, chills, body aches, etc .  Blood pressure is not at goal for age and co-morbidities.   Recommendations: monitoring at home for 2 weeks, then follow-up with PCP - BP goal <130/80 - monitor and log blood pressures at home - check around the same time each day in a relaxed setting - Limit salt to <2000 mg/day - Follow DASH eating plan (heart healthy diet) - limit alcohol to 2 standard drinks per day for men and 1 per day for women - avoid tobacco products - get at least 2 hours of regular aerobic exercise weekly Patient aware of signs/symptoms requiring further/urgent evaluation.

## 2023-01-27 NOTE — Progress Notes (Signed)
   Acute Office Visit  Subjective:     Patient ID: Jordan Ramirez, female    DOB: 02/24/1961, 62 y.o.   MRN: [id number]  Chief Complaint  Patient presents with   Skin Problem    HPI Patient is in today for sore to her buttocks.   She reports that about 4 or 5 days ago, when wiping after using the bathroom, she started noticing a sore area to her buttocks. States it feels raw and burns. She has noticed a small amount of drainage recently that does have a slight odor. She is not sure if she may be wiping to hard and causing friction.     ROS All review of systems negative except what is listed in the HPI      Objective:    BP (!) 143/74   Pulse 93   Temp 98 F (36.7 C) (Oral)   Ht 5\' 7"  (1.702 m)   Wt 246 lb (111.6 kg)   SpO2 99%   BMI 38.53 kg/m    Physical Exam Vitals reviewed.  Constitutional:      Appearance: Normal appearance.  Genitourinary:   Neurological:     General: No focal deficit present.     Mental Status: She is alert and oriented to person, place, and time. Mental status is at baseline.  Psychiatric:        Mood and Affect: Mood normal.        Behavior: Behavior normal.        Thought Content: Thought content normal.        Judgment: Judgment normal.     No results found for any visits on 01/27/23.      Assessment & Plan:   Problem List Items Addressed This Visit   None Visit Diagnoses     Skin abrasion    -  Primary   Relevant Medications   mupirocin ointment (BACTROBAN) 2 %     Try to avoid wiping with rough paper - consider rinsing with peri-bottle after using the bathroom and gently pat dry Keep clean Use mupirocin ointment twice day Can also try barrier cream/diaper cream as well  Please let us know for any signs of worsening infection so we can change to oral antibiotics if needed - increased pain, redness, swelling, induration (hard knot), excess drainage, fevers, chills, body aches, etc .  HTN: Blood pressure is not at  goal for age and co-morbidities.   Recommendations: monitoring at home for 2 weeks, then follow-up with PCP - BP goal <130/80 - monitor and log blood pressures at home - check around the same time each day in a relaxed setting - Limit salt to <2000 mg/day - Follow DASH eating plan (heart healthy diet) - limit alcohol to 2 standard drinks per day for men and 1 per day for women - avoid tobacco products - get at least 2 hours of regular aerobic exercise weekly Patient aware of signs/symptoms requiring further/urgent evaluation.  Meds ordered this encounter  Medications   mupirocin ointment (BACTROBAN) 2 %    Sig: Apply 1 Application topically 2 (two) times daily.    Dispense:  22 g    Refill:  1    Order Specific Question:   Supervising Provider    Answer:   Danise Edge A [[id number]]    Return if symptoms worsen or fail to improve.  Clayborne Dana, NP

## 2023-02-21 ENCOUNTER — Other Ambulatory Visit: Payer: Self-pay | Admitting: Family Medicine

## 2023-02-21 DIAGNOSIS — E1149 Type 2 diabetes mellitus with other diabetic neurological complication: Secondary | ICD-10-CM

## 2023-02-25 ENCOUNTER — Other Ambulatory Visit: Payer: Self-pay | Admitting: Family Medicine

## 2023-02-25 DIAGNOSIS — I10 Essential (primary) hypertension: Secondary | ICD-10-CM

## 2023-02-25 DIAGNOSIS — E1149 Type 2 diabetes mellitus with other diabetic neurological complication: Secondary | ICD-10-CM

## 2023-07-13 ENCOUNTER — Other Ambulatory Visit: Payer: Self-pay | Admitting: Family Medicine

## 2023-07-13 ENCOUNTER — Ambulatory Visit (INDEPENDENT_AMBULATORY_CARE_PROVIDER_SITE_OTHER): Payer: Medicaid Other | Admitting: Family Medicine

## 2023-07-13 ENCOUNTER — Encounter: Payer: Self-pay | Admitting: Family Medicine

## 2023-07-13 VITALS — BP 170/100 | HR 95 | Temp 98.7°F | Resp 16 | Ht 67.0 in | Wt 239.4 lb

## 2023-07-13 DIAGNOSIS — Z0000 Encounter for general adult medical examination without abnormal findings: Secondary | ICD-10-CM | POA: Insufficient documentation

## 2023-07-13 DIAGNOSIS — Z111 Encounter for screening for respiratory tuberculosis: Secondary | ICD-10-CM

## 2023-07-13 DIAGNOSIS — E785 Hyperlipidemia, unspecified: Secondary | ICD-10-CM | POA: Diagnosis not present

## 2023-07-13 DIAGNOSIS — E1149 Type 2 diabetes mellitus with other diabetic neurological complication: Secondary | ICD-10-CM

## 2023-07-13 DIAGNOSIS — I10 Essential (primary) hypertension: Secondary | ICD-10-CM

## 2023-07-13 DIAGNOSIS — Z1211 Encounter for screening for malignant neoplasm of colon: Secondary | ICD-10-CM | POA: Diagnosis not present

## 2023-07-13 DIAGNOSIS — Z7984 Long term (current) use of oral hypoglycemic drugs: Secondary | ICD-10-CM | POA: Diagnosis not present

## 2023-07-13 HISTORY — DX: Encounter for general adult medical examination without abnormal findings: Z00.00

## 2023-07-13 LAB — POCT GLYCOSYLATED HEMOGLOBIN (HGB A1C): HbA1c, POC (prediabetic range): 6 % (ref 5.7–6.4)

## 2023-07-13 NOTE — Assessment & Plan Note (Signed)
Elevated BP here in the office, she is not monitoring BP at home. Rechecked: 170/100. Possible complications of elevated BP discussed. Instructed to monitor BP at home for the next 2 weeks in the morning and at night, adequate technique discussed and to let me know about readings. Low salt/DASH diet to continue. Instructed about warning signs. If BP at home is adequately controlled, we will see her back in 5 months.

## 2023-07-13 NOTE — Assessment & Plan Note (Signed)
She is not on pharmacologic treatment. CV benefits of statins discussed. She is having fasting lipid panel tomorrow, further recommendation will be given accordingly.

## 2023-07-13 NOTE — Progress Notes (Unsigned)
HPI: Jordan Ramirez is a 62 y.o. female with a PMHx significant for HTN, HLD, DM II, and peripheral neuropathy, who is here today for her routine physical.  Last CPE: 02/13/2020 Last seen: 05/25/2022  Exercise: She works out at [employer] 3-4 times per week. She hasn't been going lately because her car is broken. Diet: She is cooking at home. She is primarily using plant-based proteins, like mushrooms and lentils, although she does occasionally eat meat. Sleep: Usually doing 2-3 hours, then wakes up for 2-3 hours, then goes back to sleep for 2-3 more hours.  Alcohol Use: none Smoking: Never Vision: More than a year Dental: More than a year  Immunization History  Administered Date(s) Administered   PFIZER(Purple Top)SARS-COV-2 Vaccination 01/03/2020, 01/22/2020, 09/24/2020   Pfizer Covid-19 Vaccine Bivalent Booster 60yrs & up 12/29/2021   Td 09/06/2006   Health Maintenance  Topic Date Due   OPHTHALMOLOGY EXAM  Never done   Colonoscopy  Never done   Zoster Vaccines- Shingrix (1 of 2) Never done   DTaP/Tdap/Td (2 - Tdap) 09/06/2016   Cervical Cancer Screening (HPV/Pap Cotest)  01/23/2019   MAMMOGRAM  09/30/2019   Diabetic kidney evaluation - Urine ACR  02/10/2023   INFLUENZA VACCINE  Never done   Diabetic kidney evaluation - eGFR measurement  05/26/2023   FOOT EXAM  05/26/2023   COVID-19 Vaccine (5 - 2023-24 season) 07/28/2023 (Originally 06/19/2023)   HEMOGLOBIN A1C  01/10/2024   Hepatitis C Screening  Completed   HIV Screening  Completed   HPV VACCINES  Aged Out   Chronic medical problems:   DM II:   -BG at home: She has been checking at home. Her readings are averaging 110-112.  - Medications: She is taking metformin 1500 mg daily. She is no longer taking Trulicity.  - eye exam: more than one year - foot exam: Performed today. She notes some numbness on her left great toe.  - Negative for symptoms of hypoglycemia, polyuria, polydipsia, numbness extremities, foot  ulcers/trauma Lab Results  Component Value Date   HGBA1C 6.0 07/13/2023   Lab Results  Component Value Date   MICROALBUR 5.1 (H) 02/09/2022   MICROALBUR 9.7 (H) 02/13/2020   HTN:  Medication: She is currently taking amlodipine 10 mg daily and lisinopril 20 mg daily. She is no longer taking furosemide. Checking at home: She has not been checking at home.  Side effects: none  Negative for unusual or severe headache, visual changes, exertional chest pain, dyspnea,  focal weakness, or edema.  Lab Results  Component Value Date   NA 140 05/25/2022   CL 103 05/25/2022   K 3.5 05/25/2022   CO2 28 05/25/2022   BUN 13 05/25/2022   CREATININE 1.01 05/25/2022   GFR 59.74 (L) 05/25/2022   CALCIUM 9.4 05/25/2022   ALBUMIN 4.2 04/10/2022   GLUCOSE 158 (H) 05/25/2022   HLD:  Lab Results  Component Value Date   CHOL 233 (H) 02/09/2022   HDL 59.70 02/09/2022   LDLCALC 149 (H) 02/09/2022   LDLDIRECT 118.5 04/17/2012   TRIG 122.0 02/09/2022   CHOLHDL 4 02/09/2022   Concerns today:  Anxiety/depression: Her mom has recently passed, and she says she has been feeling low.  She also mentioned she has been struggling to find work.   Review of Systems  Constitutional:  Negative for activity change, appetite change and fever.  HENT:  Negative for hearing loss, mouth sores, sore throat and trouble swallowing.   Eyes:  Negative for redness  and visual disturbance.  Respiratory:  Negative for cough, shortness of breath and wheezing.   Cardiovascular:  Negative for chest pain and leg swelling.  Gastrointestinal:  Negative for abdominal pain, nausea and vomiting.       No changes in bowel habits.  Endocrine: Negative for cold intolerance, heat intolerance, polydipsia, polyphagia and polyuria.  Genitourinary:  Negative for decreased urine volume, dysuria, hematuria, vaginal bleeding and vaginal discharge.  Musculoskeletal:  Negative for gait problem and myalgias.  Skin:  Negative for color  change and rash.  Allergic/Immunologic: Negative for environmental allergies.  Neurological:  Negative for seizures, syncope, weakness and headaches.  Hematological:  Negative for adenopathy. Does not bruise/bleed easily.  Psychiatric/Behavioral:  Negative for confusion. The patient is not nervous/anxious.   All other systems reviewed and are negative.   Current Outpatient Medications on File Prior to Visit  Medication Sig Dispense Refill   amLODipine (NORVASC) 10 MG tablet TAKE 1 TABLET BY MOUTH EVERY DAY 90 tablet 1   Blood Glucose Monitoring Suppl (ONETOUCH VERIO) w/Device KIT Use to test blood sugars once daily. 1 kit 1   Cholecalciferol (VITAMIN D3 PO) Take 1 tablet by mouth daily.     glucose blood (ONETOUCH VERIO) test strip Use to test blood sugars once daily. 100 each 12   Lancets (ONETOUCH ULTRASOFT) lancets Use to test blood sugars once daily. 100 each 12   lisinopril (ZESTRIL) 20 MG tablet TAKE 1 TABLET BY MOUTH EVERY DAY 90 tablet 1   metFORMIN (GLUCOPHAGE-XR) 500 MG 24 hr tablet TAKE 1500 MG BY MOUTH DAILY (500 MG WITH BREAKFAST AND 1000 MG WITH SUPPER) 90 tablet 2   Multiple Vitamins-Minerals (ZINC PO) Take 1 tablet by mouth daily.     No current facility-administered medications on file prior to visit.    Past Medical History:  Diagnosis Date   Arthritis    Chronic low back pain    Diabetes mellitus without complication (HCC)    Hypertension    Neuromuscular disorder [facility]) July 2019   Treatment by Orthopedic specialist   Obesity     Past Surgical History:  Procedure Laterality Date   PILONIDAL CYST EXCISION     WISDOM TOOTH EXTRACTION      No Known Allergies  Family History  Problem Relation Age of Onset   Coronary artery disease Mother    Diabetes type II Mother    Depression Mother    Hypertension Mother    Arthritis Mother    Diabetes Mother    Hearing loss Mother    Heart disease Mother    Kidney disease Mother    Obesity Mother    Stroke  Mother    Kidney failure Father    Arthritis Father    Hearing loss Father    Heart disease Father    Hypertension Father    Kidney disease Father    Obesity Father    Stroke Father    Arthritis Maternal Grandmother    Diabetes Maternal Grandmother    Hearing loss Maternal Grandmother    Heart disease Maternal Grandmother    Kidney disease Maternal Grandmother    Obesity Maternal Grandmother    Hypertension Brother     Social History   Socioeconomic History   Marital status: Single    Spouse name: Not on file   Number of children: Not on file   Years of education: Not on file   Highest education level: Master's degree (e.g., MA, MS, MEng, MEd, MSW, MBA)  Occupational  History   Occupation: Unemployed    Comment: 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, academic  Tobacco Use   Smoking status: Never   Smokeless tobacco: Never  Substance and Sexual Activity   Alcohol use: Not Currently   Drug use: No   Sexual activity: Yes    Birth control/protection: Abstinence, Post-menopausal, None  Other Topics Concern   Not on file  Social History Narrative   Not on file   Social Determinants of Health   Financial Resource Strain: High Risk (07/12/2023)   Overall Financial Resource Strain (CARDIA)    Difficulty of Paying Living Expenses: Very hard  Food Insecurity: Food Insecurity Present (07/12/2023)   Hunger Vital Sign    Worried About Running Out of Food in the Last Year: Often true    Ran Out of Food in the Last Year: Often true  Transportation Needs: Unmet Transportation Needs (07/12/2023)   PRAPARE - Transportation    Lack of Transportation (Medical): No    Lack of Transportation (Non-Medical): Yes  Physical Activity: Sufficiently Active (07/12/2023)   Exercise Vital Sign    Days of Exercise per Week: 3 days    Minutes of Exercise per Session: 60 min  Stress: No Stress Concern Present (07/12/2023)   [employer] of Occupational Health -  Occupational Stress Questionnaire    Feeling of Stress : Only a little  Social Connections: Moderately Integrated (07/12/2023)   Social Connection and Isolation Panel [NHANES]    Frequency of Communication with Friends and Family: More than three times a week    Frequency of Social Gatherings with Friends and Family: Once a week    Attends Religious Services: More than 4 times per year    Active Member of Clubs or Organizations: Yes    Attends Banker Meetings: More than 4 times per year    Marital Status: Never married    Vitals:   07/13/23 1340 07/13/23 1420  BP: (!) 160/110 (!) 170/100  Pulse: 95   Resp: 16   Temp: 98.7 F (37.1 C)   SpO2: 97%    Body mass index is 37.49 kg/m.  Wt Readings from Last 3 Encounters:  07/13/23 239 lb 6 oz (108.6 kg)  01/27/23 246 lb (111.6 kg)  05/25/22 274 lb 6 oz (124.5 kg)    Physical Exam Vitals and nursing note reviewed.  Constitutional:      General: She is not in acute distress.    Appearance: She is well-developed.  HENT:     Head: Normocephalic and atraumatic.     Right Ear: Tympanic membrane, ear canal and external ear normal.     Left Ear: Tympanic membrane, ear canal and external ear normal.     Mouth/Throat:     Mouth: Mucous membranes are moist.     Pharynx: Oropharynx is clear. Uvula midline.  Eyes:     Extraocular Movements: Extraocular movements intact.     Conjunctiva/sclera: Conjunctivae normal.     Pupils: Pupils are equal, round, and reactive to light.  Neck:     Thyroid: No thyromegaly.     Trachea: No tracheal deviation.  Cardiovascular:     Rate and Rhythm: Normal rate and regular rhythm.     Pulses:          Dorsalis pedis pulses are 2+ on the right side and 2+ on the left side.     Heart sounds: No murmur heard. Pulmonary:     Effort: Pulmonary effort is normal. No  respiratory distress.     Breath sounds: Normal breath sounds.  Abdominal:     Palpations: Abdomen is soft. There is no  hepatomegaly or mass.     Tenderness: There is no abdominal tenderness.  Genitourinary:    Comments: Deferred to gyn. Musculoskeletal:     Cervical back: No tenderness or bony tenderness.     Thoracic back: No tenderness or bony tenderness.     Lumbar back: No tenderness or bony tenderness. Negative right straight leg raise test and negative left straight leg raise test.     Right lower leg: No edema.     Left lower leg: No edema.     Comments: No major deformity or signs of synovitis appreciated.  Lymphadenopathy:     Cervical: No cervical adenopathy.     Upper Body:     Right upper body: No supraclavicular adenopathy.     Left upper body: No supraclavicular adenopathy.  Skin:    General: Skin is warm.     Findings: No erythema or rash.  Neurological:     General: No focal deficit present.     Mental Status: She is alert and oriented to person, place, and time.     Cranial Nerves: No cranial nerve deficit.     Coordination: Coordination normal.     Gait: Gait normal.     Deep Tendon Reflexes:     Reflex Scores:      Bicep reflexes are 2+ on the right side and 2+ on the left side.      Patellar reflexes are 2+ on the right side and 2+ on the left side. Psychiatric:     Comments: Well groomed, good eye contact.    ASSESSMENT AND PLAN: Ms. Louann Huebert was here today for her annual physical examination.  Orders Placed This Encounter  Procedures   POC HgB A1c    @ASSESSPLAN @  Encounter for preventive care  Diabetes mellitus type 2 with neurological manifestations (HCC) -     POCT glycosylated hemoglobin (Hb A1C)  Essential hypertension  Hyperlipidemia, unspecified hyperlipidemia type  Routine general medical examination at a health care facility  Colon cancer screening    Return in 6 months (on 01/10/2024).  I, Suanne Marker, acting as a scribe for Meerab Maselli [place], MD., have documented all relevant documentation on the behalf of Miliano Cotten [place], MD, as directed  by  Khira Cudmore [place], MD while in the presence of Reynard Christoffersen [place], MD.   I, Suanne Marker, have reviewed all documentation for this visit. The documentation on 07/13/23 for the exam, diagnosis, procedures, and orders are all accurate and complete.  Cortnie Ringel G. [place], MD  [facility]. [place] [facility].

## 2023-07-13 NOTE — Assessment & Plan Note (Signed)
HgA1C at goal, he went from 7.3 to 6.0. Continue Glucophage XR 500 mg daily. Annual eye exam, periodic dental and foot care recommended. F/U in 5-6 months

## 2023-07-13 NOTE — Patient Instructions (Addendum)
A few things to remember from today's visit:  Encounter for preventive care  Diabetes mellitus type 2 with neurological manifestations (HCC) - Plan: POC HgB A1c  Essential hypertension  Hyperlipidemia, unspecified hyperlipidemia type  Routine general medical examination at a health care facility  Colon cancer screening  Blood pressure readings in 2 weeks, goal 130/80 or less. No changes today. Labs tomorrow.  Arrange appt with your gynecologist. Vaccination can be updated at the Health department.  If you need refills for medications you take chronically, please call your pharmacy. Do not use My Chart to request refills or for acute issues that need immediate attention. If you send a my chart message, it may take a few days to be addressed, specially if I am not in the office.  Please be sure medication list is accurate. If a new problem present, please set up appointment sooner than planned today.  Health Maintenance, Female Adopting a healthy lifestyle and getting preventive care are important in promoting health and wellness. Ask your health care provider about: The right schedule for you to have regular tests and exams. Things you can do on your own to prevent diseases and keep yourself healthy. What should I know about diet, weight, and exercise? Eat a healthy diet  Eat a diet that includes plenty of vegetables, fruits, low-fat dairy products, and lean protein. Do not eat a lot of foods that are high in solid fats, added sugars, or sodium. Maintain a healthy weight Body mass index (BMI) is used to identify weight problems. It estimates body fat based on height and weight. Your health care provider can help determine your BMI and help you achieve or maintain a healthy weight. Get regular exercise Get regular exercise. This is one of the most important things you can do for your health. Most adults should: Exercise for at least 150 minutes each week. The exercise should  increase your heart rate and make you sweat (moderate-intensity exercise). Do strengthening exercises at least twice a week. This is in addition to the moderate-intensity exercise. Spend less time sitting. Even light physical activity can be beneficial. Watch cholesterol and blood lipids Have your blood tested for lipids and cholesterol at 63 years of age, then have this test every 5 years. Have your cholesterol levels checked more often if: Your lipid or cholesterol levels are high. You are older than 63 years of age. You are at high risk for heart disease. What should I know about cancer screening? Depending on your health history and family history, you may need to have cancer screening at various ages. This may include screening for: Breast cancer. Cervical cancer. Colorectal cancer. Skin cancer. Lung cancer. What should I know about heart disease, diabetes, and high blood pressure? Blood pressure and heart disease High blood pressure causes heart disease and increases the risk of stroke. This is more likely to develop in people who have high blood pressure readings or are overweight. Have your blood pressure checked: Every 3-5 years if you are 11-53 years of age. Every year if you are 63 years old or older. Diabetes Have regular diabetes screenings. This checks your fasting blood sugar level. Have the screening done: Once every three years after age 72 if you are at a normal weight and have a low risk for diabetes. More often and at a younger age if you are overweight or have a high risk for diabetes. What should I know about preventing infection? Hepatitis B If you have a higher risk  for hepatitis B, you should be screened for this virus. Talk with your health care provider to find out if you are at risk for hepatitis B infection. Hepatitis C Testing is recommended for: Everyone born from [birth year] through 1966. Anyone with known risk factors for hepatitis C. Sexually transmitted  infections (STIs) Get screened for STIs, including gonorrhea and chlamydia, if: You are sexually active and are younger than 63 years of age. You are older than 63 years of age and your health care provider tells you that you are at risk for this type of infection. Your sexual activity has changed since you were last screened, and you are at increased risk for chlamydia or gonorrhea. Ask your health care provider if you are at risk. Ask your health care provider about whether you are at high risk for HIV. Your health care provider may recommend a prescription medicine to help prevent HIV infection. If you choose to take medicine to prevent HIV, you should first get tested for HIV. You should then be tested every 3 months for as long as you are taking the medicine. Pregnancy If you are about to stop having your period (premenopausal) and you may become pregnant, seek counseling before you get pregnant. Take 400 to 800 micrograms (mcg) of folic acid every day if you become pregnant. Ask for birth control (contraception) if you want to prevent pregnancy. Osteoporosis and menopause Osteoporosis is a disease in which the bones lose minerals and strength with aging. This can result in bone fractures. If you are 74 years old or older, or if you are at risk for osteoporosis and fractures, ask your health care provider if you should: Be screened for bone loss. Take a calcium or vitamin D supplement to lower your risk of fractures. Be given hormone replacement therapy (HRT) to treat symptoms of menopause. Follow these instructions at home: Alcohol use Do not drink alcohol if: Your health care provider tells you not to drink. You are pregnant, may be pregnant, or are planning to become pregnant. If you drink alcohol: Limit how much you have to: 0-1 drink a day. Know how much alcohol is in your drink. In the [place] one drink equals one 12 oz bottle of beer (355 mL), one 5 oz glass of wine (148 mL), or one  1 oz glass of hard liquor (44 mL). Lifestyle Do not use any products that contain nicotine or tobacco. These products include cigarettes, chewing tobacco, and vaping devices, such as e-cigarettes. If you need help quitting, ask your health care provider. Do not use street drugs. Do not share needles. Ask your health care provider for help if you need support or information about quitting drugs. General instructions Schedule regular health, dental, and eye exams. Stay current with your vaccines. Tell your health care provider if: You often feel depressed. You have ever been abused or do not feel safe at home. Summary Adopting a healthy lifestyle and getting preventive care are important in promoting health and wellness. Follow your health care provider's instructions about healthy diet, exercising, and getting tested or screened for diseases. Follow your health care provider's instructions on monitoring your cholesterol and blood pressure. This information is not intended to replace advice given to you by your health care provider. Make sure you discuss any questions you have with your health care provider. Document Revised: 02/23/2021 Document Reviewed: 02/23/2021 Elsevier Patient Education  2024 [employer].

## 2023-07-14 ENCOUNTER — Other Ambulatory Visit (INDEPENDENT_AMBULATORY_CARE_PROVIDER_SITE_OTHER): Payer: Medicaid Other

## 2023-07-14 DIAGNOSIS — I10 Essential (primary) hypertension: Secondary | ICD-10-CM | POA: Diagnosis not present

## 2023-07-14 DIAGNOSIS — E1149 Type 2 diabetes mellitus with other diabetic neurological complication: Secondary | ICD-10-CM

## 2023-07-14 DIAGNOSIS — Z111 Encounter for screening for respiratory tuberculosis: Secondary | ICD-10-CM | POA: Diagnosis not present

## 2023-07-14 DIAGNOSIS — E785 Hyperlipidemia, unspecified: Secondary | ICD-10-CM | POA: Diagnosis not present

## 2023-07-14 LAB — COMPREHENSIVE METABOLIC PANEL
ALT: 12 U/L (ref 0–35)
AST: 16 U/L (ref 0–37)
Albumin: 4.2 g/dL (ref 3.5–5.2)
Alkaline Phosphatase: 74 U/L (ref 39–117)
BUN: 7 mg/dL (ref 6–23)
CO2: 28 mEq/L (ref 19–32)
Calcium: 9.5 mg/dL (ref 8.4–10.5)
Chloride: 105 mEq/L (ref 96–112)
Creatinine, Ser: 1.03 mg/dL (ref 0.40–1.20)
GFR: 57.89 mL/min — ABNORMAL LOW (ref >60.00)
Glucose, Bld: 112 mg/dL — ABNORMAL HIGH (ref 70–99)
Potassium: 3.6 mEq/L (ref 3.5–5.1)
Sodium: 142 mEq/L (ref 135–145)
Total Bilirubin: 0.8 mg/dL (ref 0.2–1.2)
Total Protein: 7.3 g/dL (ref 6.0–8.3)

## 2023-07-14 LAB — LIPID PANEL
Cholesterol: 218 mg/dL — ABNORMAL HIGH (ref 0–200)
HDL: 69 mg/dL (ref >39.00)
LDL Cholesterol: 132 mg/dL — ABNORMAL HIGH (ref 0–99)
NonHDL: 148.55
Total CHOL/HDL Ratio: 3
Triglycerides: 84 mg/dL (ref 0.0–149.0)
VLDL: 16.8 mg/dL (ref 0.0–40.0)

## 2023-07-14 MED ORDER — METFORMIN HCL ER 500 MG PO TB24
ORAL_TABLET | ORAL | 2 refills | Status: DC
Start: 2023-07-14 — End: 2023-12-12

## 2023-07-14 MED ORDER — AMLODIPINE BESYLATE 10 MG PO TABS: 10.0000 mg | ORAL_TABLET | Freq: Every day | ORAL | 2 refills | Status: DC

## 2023-07-14 MED ORDER — LISINOPRIL 20 MG PO TABS: 20.0000 mg | ORAL_TABLET | Freq: Every day | ORAL | 2 refills | Status: DC

## 2023-07-14 MED ORDER — ROSUVASTATIN CALCIUM 10 MG PO TABS: 10.0000 mg | ORAL_TABLET | Freq: Every day | ORAL | 3 refills | Status: DC

## 2023-07-14 NOTE — Assessment & Plan Note (Signed)
We discussed the importance of regular physical activity and healthy diet for prevention of chronic illness and/or complications. Preventive guidelines reviewed. Vaccination: Shingrix, Tdap,and influenza vaccine to be given at the [facility]. Ca++ and vit D supplementation recommended. Overdue for mammogram and pap smear, will arrange appt with her gynecologist. Next CPE in a year.

## 2023-07-14 NOTE — Addendum Note (Signed)
Addended by: Clearnce Sorrel on: 07/14/2023 01:57 PM   Modules accepted: Orders

## 2023-07-14 NOTE — Progress Notes (Signed)
Pt came for labs only, tolerated draw well.   

## 2023-07-14 NOTE — Assessment & Plan Note (Signed)
She has lost about 7 Lb since her last visit. She understands the benefits of wt loss as well as adverse effects of obesity. Consistency with healthy diet and physical activity encouraged.

## 2023-07-17 LAB — QUANTIFERON-TB GOLD PLUS
Mitogen-NIL: 7.91 [IU]/mL
NIL: 0.02 [IU]/mL
QuantiFERON-TB Gold Plus: NEGATIVE
TB1-NIL: 0.04 [IU]/mL
TB2-NIL: 0 [IU]/mL

## 2023-07-19 ENCOUNTER — Encounter: Payer: Self-pay | Admitting: Family Medicine

## 2023-07-19 ENCOUNTER — Other Ambulatory Visit: Payer: Medicaid Other

## 2023-07-19 DIAGNOSIS — E1149 Type 2 diabetes mellitus with other diabetic neurological complication: Secondary | ICD-10-CM | POA: Diagnosis not present

## 2023-07-19 LAB — MICROALBUMIN / CREATININE URINE RATIO
Creatinine,U: 65.3 mg/dL
Microalb Creat Ratio: 1.1 mg/g (ref 0.0–30.0)
Microalb, Ur: <0.7 mg/dL (ref 0.0–1.9)

## 2023-07-19 NOTE — Telephone Encounter (Signed)
In your bin to be signed.  

## 2023-09-04 ENCOUNTER — Other Ambulatory Visit: Payer: Self-pay | Admitting: Family Medicine

## 2023-09-04 DIAGNOSIS — E1149 Type 2 diabetes mellitus with other diabetic neurological complication: Secondary | ICD-10-CM

## 2023-09-09 ENCOUNTER — Telehealth: Payer: Self-pay | Admitting: Family Medicine

## 2023-09-09 DIAGNOSIS — E1149 Type 2 diabetes mellitus with other diabetic neurological complication: Secondary | ICD-10-CM

## 2023-09-09 NOTE — Telephone Encounter (Signed)
Referral entered  

## 2023-09-09 NOTE — Telephone Encounter (Signed)
Pt need dm eye exams [facility] phone number [phone] . Pt has [employer] medicaid and need a referral

## 2023-09-20 ENCOUNTER — Encounter: Payer: Self-pay | Admitting: Internal Medicine

## 2023-09-21 DIAGNOSIS — F4321 Adjustment disorder with depressed mood: Secondary | ICD-10-CM | POA: Diagnosis not present

## 2023-09-23 ENCOUNTER — Encounter (HOSPITAL_BASED_OUTPATIENT_CLINIC_OR_DEPARTMENT_OTHER): Payer: Self-pay | Admitting: Radiology

## 2023-09-23 ENCOUNTER — Ambulatory Visit (HOSPITAL_BASED_OUTPATIENT_CLINIC_OR_DEPARTMENT_OTHER)
Admission: RE | Admit: 2023-09-23 | Discharge: 2023-09-23 | Disposition: A | Discharge status: Home / Self Care | Payer: Medicaid Other | Source: Ambulatory Visit | Attending: Family Medicine | Admitting: Family Medicine

## 2023-09-23 DIAGNOSIS — Z1231 Encounter for screening mammogram for malignant neoplasm of breast: Secondary | ICD-10-CM | POA: Insufficient documentation

## 2023-09-29 DIAGNOSIS — F4321 Adjustment disorder with depressed mood: Secondary | ICD-10-CM | POA: Diagnosis not present

## 2023-09-30 ENCOUNTER — Ambulatory Visit: Payer: Medicaid Other | Admitting: Family Medicine

## 2023-10-03 ENCOUNTER — Encounter: Payer: Self-pay | Admitting: Family Medicine

## 2023-10-03 ENCOUNTER — Ambulatory Visit: Payer: Medicaid Other

## 2023-10-03 ENCOUNTER — Ambulatory Visit: Payer: Medicaid Other | Admitting: Family Medicine

## 2023-10-03 VITALS — BP 140/80 | HR 100 | Temp 98.4°F | Resp 16 | Ht 67.0 in | Wt 239.0 lb

## 2023-10-03 DIAGNOSIS — M549 Dorsalgia, unspecified: Secondary | ICD-10-CM | POA: Diagnosis not present

## 2023-10-03 DIAGNOSIS — I10 Essential (primary) hypertension: Secondary | ICD-10-CM | POA: Diagnosis not present

## 2023-10-03 DIAGNOSIS — M545 Low back pain, unspecified: Secondary | ICD-10-CM

## 2023-10-03 DIAGNOSIS — M4316 Spondylolisthesis, lumbar region: Secondary | ICD-10-CM | POA: Diagnosis not present

## 2023-10-03 DIAGNOSIS — G8929 Other chronic pain: Secondary | ICD-10-CM

## 2023-10-03 DIAGNOSIS — M47816 Spondylosis without myelopathy or radiculopathy, lumbar region: Secondary | ICD-10-CM | POA: Diagnosis not present

## 2023-10-03 DIAGNOSIS — M5136 Other intervertebral disc degeneration, lumbar region with discogenic back pain only: Secondary | ICD-10-CM | POA: Diagnosis not present

## 2023-10-03 MED ORDER — LISINOPRIL 30 MG PO TABS: 30.0000 mg | ORAL_TABLET | Freq: Every day | ORAL | 1 refills | Status: DC

## 2023-10-03 MED ORDER — TIZANIDINE HCL 4 MG PO TABS: 2.0000 mg | ORAL_TABLET | Freq: Every day | ORAL | 0 refills | Status: DC

## 2023-10-03 NOTE — Progress Notes (Unsigned)
ACUTE VISIT Chief Complaint  Patient presents with   Back Pain   HPI: Jordan Ramirez is a 62 y.o. female with a PMHx significant for HTN, HLD, DM II, and peripheral neuropathy, who is here today complaining of right-sided back pain.   Patient complains of an intermittent right-sided lower back pain for 2-3 weeks. She says she has had similar pain before, most recently in 07/2018 when she was at a physical therapy appointment. At that time she was following with Dr. Ethelene Hal, who she says she last saw around 2020. She describes the pain as a pressure sensation and rates it as a 4/10. It is worsened with movement, and radiates to her right groin. Denies any bulge in her groin.   She has not taken anything OTC for the pain.  Pertinent negatives include numbness, tingling, radiation to her legs, abdominal pain, bowel or urinary changes, unusual injury or activity, fever, chills, or abnormal weight loss.  She mentions she has not been exercising recently because she doesn't have transportation.   Hypertension:  Medications: Currently on lisinopril 20 mg daily and amlodipine 10 mg daily.  BP readings at home: She has been checking her BP at home, and says her readings are consistently high (>130/85).  Side effects: none Negative for unusual or severe headache, visual changes, exertional chest pain, dyspnea,  focal weakness, or edema.  Lab Results  Component Value Date   CREATININE 1.03 07/14/2023   BUN 7 07/14/2023   NA 142 07/14/2023   K 3.6 07/14/2023   CL 105 07/14/2023   CO2 28 07/14/2023   Review of Systems See other pertinent positives and negatives in HPI.  Current Outpatient Medications on File Prior to Visit  Medication Sig Dispense Refill   amLODipine (NORVASC) 10 MG tablet Take 1 tablet (10 mg total) by mouth daily. 90 tablet 2   Blood Glucose Monitoring Suppl (ONETOUCH VERIO) w/Device KIT Use to test blood sugars once daily. 1 kit 1   Cholecalciferol (VITAMIN D3 PO) Take  1 tablet by mouth daily.     glucose blood (ONETOUCH VERIO) test strip Use to test blood sugars once daily. 100 each 12   Lancets (ONETOUCH ULTRASOFT) lancets Use to test blood sugars once daily. 100 each 12   lisinopril (ZESTRIL) 20 MG tablet Take 1 tablet (20 mg total) by mouth daily. 90 tablet 2   metFORMIN (GLUCOPHAGE-XR) 500 MG 24 hr tablet Take 1500 mg by mouth daily (500 mg with breakfast and 1000 mg with supper) 90 tablet 2   Multiple Vitamins-Minerals (ZINC PO) Take 1 tablet by mouth daily.     rosuvastatin (CRESTOR) 10 MG tablet Take 1 tablet (10 mg total) by mouth daily. 90 tablet 3   No current facility-administered medications on file prior to visit.    Past Medical History:  Diagnosis Date   Arthritis    Chronic low back pain    Diabetes mellitus without complication (HCC)    Hypertension    Neuromuscular disorder [facility]) July 2019   Treatment by Orthopedic specialist   Obesity    Routine general medical examination at a health care facility 07/13/2023   No Known Allergies  Social History   Socioeconomic History   Marital status: Single    Spouse name: Not on file   Number of children: Not on file   Years of education: Not on file   Highest education level: Master's degree (e.g., MA, MS, MEng, MEd, MSW, MBA)  Occupational History   Occupation: Unemployed  Comment: 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, academic  Tobacco Use   Smoking status: Never   Smokeless tobacco: Never  Substance and Sexual Activity   Alcohol use: Not Currently   Drug use: No   Sexual activity: Yes    Birth control/protection: Abstinence, Post-menopausal, None  Other Topics Concern   Not on file  Social History Narrative   Not on file   Social Drivers of Health   Financial Resource Strain: High Risk (09/30/2023)   Overall Financial Resource Strain (CARDIA)    Difficulty of Paying Living Expenses: Very hard  Food Insecurity: Food Insecurity Present  (09/30/2023)   Hunger Vital Sign    Worried About Running Out of Food in the Last Year: Often true    Ran Out of Food in the Last Year: Sometimes true  Transportation Needs: Unmet Transportation Needs (09/30/2023)   PRAPARE - Transportation    Lack of Transportation (Medical): Yes    Lack of Transportation (Non-Medical): Yes  Physical Activity: Insufficiently Active (09/30/2023)   Exercise Vital Sign    Days of Exercise per Week: 1 day    Minutes of Exercise per Session: 20 min  Stress: No Stress Concern Present (09/30/2023)   [employer] of Occupational Health - Occupational Stress Questionnaire    Feeling of Stress : Only a little  Social Connections: Moderately Integrated (09/30/2023)   Social Connection and Isolation Panel [NHANES]    Frequency of Communication with Friends and Family: More than three times a week    Frequency of Social Gatherings with Friends and Family: Once a week    Attends Religious Services: More than 4 times per year    Active Member of [employer] or Organizations: Yes    Attends Banker Meetings: More than 4 times per year    Marital Status: Never married    Vitals:   10/03/23 1431  BP: (!) 140/80  Pulse: 100  Temp: 98.4 F (36.9 C)  SpO2: 96%   Body mass index is 37.43 kg/m.  Physical Exam Vitals and nursing note reviewed.  Constitutional:      General: She is not in acute distress.    Appearance: She is well-developed.  HENT:     Head: Normocephalic and atraumatic.  Eyes:     Conjunctiva/sclera: Conjunctivae normal.  Cardiovascular:     Rate and Rhythm: Normal rate and regular rhythm.     Pulses:          Dorsalis pedis pulses are 2+ on the right side and 2+ on the left side.     Heart sounds: No murmur heard. Pulmonary:     Effort: Pulmonary effort is normal. No respiratory distress.     Breath sounds: Normal breath sounds.  Abdominal:     Palpations: Abdomen is soft. There is no hepatomegaly or mass.     Tenderness:  There is no abdominal tenderness.  Musculoskeletal:     Lumbar back: Tenderness present. Negative right straight leg raise test and negative left straight leg raise test.       Back:     Right hip: No tenderness. Decreased range of motion.     Right lower leg: No edema.     Left lower leg: No edema.     Comments: ***  Lymphadenopathy:     Cervical: No cervical adenopathy.  Skin:    General: Skin is warm.     Findings: No erythema or rash.  Neurological:     General: No focal  deficit present.     Mental Status: She is alert and oriented to person, place, and time.     Cranial Nerves: No cranial nerve deficit.     Comments: Antalgic gait, not assisted.  Psychiatric:        Mood and Affect: Mood and affect normal.     ASSESSMENT AND PLAN:  Ms. Bitney was seen today for right-sided back pain.   There are no diagnoses linked to this encounter.  No follow-ups on file.  I, Suanne Marker, acting as a scribe for Graclynn Vanantwerp [place], MD., have documented all relevant documentation on the behalf of Danzel Marszalek [place], MD, as directed by  Ameia Morency [place], MD while in the presence of Jillianna Stanek [place], MD.   I, Suanne Marker, have reviewed all documentation for this visit. The documentation on 10/03/23 for the exam, diagnosis, procedures, and orders are all accurate and complete.  Sanford Lindblad G. [place], MD  [facility]. [place] office.

## 2023-10-03 NOTE — Patient Instructions (Addendum)
A few things to remember from today's visit:  Essential hypertension - Plan: lisinopril (ZESTRIL) 30 MG tablet  Chronic right-sided low back pain, unspecified whether sciatica present - Plan: DG Lumbar Spine Complete, Ambulatory referral to Physical Therapy  Lisinopril dose increased from 20 mg to 30 mg daily. No changes in Amlodipine. Blood pressure readings in 2-3 weeks.  If you need refills for medications you take chronically, please call your pharmacy. Do not use My Chart to request refills or for acute issues that need immediate attention. If you send a my chart message, it may take a few days to be addressed, specially if I am not in the office.  Please be sure medication list is accurate. If a new problem present, please set up appointment sooner than planned today.

## 2023-10-03 NOTE — Assessment & Plan Note (Signed)
BP is not well controlled. Possible complications of elevated BP discussed. Changes today:Lisinopril dose increased from 20 mg to 30 mg daily. Continue Amlodipine 10 mg daily. Low salt diet. Monitor BP at home. Instructed about warning signs. BP readings in 2-3 weeks. Keep appt in 12/2023.

## 2023-10-20 DIAGNOSIS — F4321 Adjustment disorder with depressed mood: Secondary | ICD-10-CM | POA: Diagnosis not present

## 2023-10-27 ENCOUNTER — Ambulatory Visit (HOSPITAL_BASED_OUTPATIENT_CLINIC_OR_DEPARTMENT_OTHER): Payer: Medicaid Other | Admitting: Physical Therapy

## 2023-11-07 ENCOUNTER — Ambulatory Visit: Payer: Medicaid Other | Admitting: Family Medicine

## 2023-11-09 ENCOUNTER — Encounter: Payer: Medicaid Other | Admitting: Obstetrics and Gynecology

## 2023-11-13 ENCOUNTER — Encounter: Payer: Self-pay | Admitting: Certified Registered Nurse Anesthetist

## 2023-11-14 ENCOUNTER — Telehealth: Payer: Self-pay

## 2023-11-14 NOTE — Telephone Encounter (Signed)
Pt's previst appt was cancelled by a staff member at [facility] at [place]. Pt is still on schedule with [facility] for 11/16/23 for colonoscopy.   Called and left VM to have pt call back to reschedule pre-visit and procedure.

## 2023-11-15 ENCOUNTER — Other Ambulatory Visit: Payer: Self-pay | Admitting: Family Medicine

## 2023-11-15 ENCOUNTER — Telehealth: Payer: Self-pay | Admitting: Internal Medicine

## 2023-11-15 DIAGNOSIS — M545 Low back pain, unspecified: Secondary | ICD-10-CM

## 2023-11-15 NOTE — Telephone Encounter (Signed)
Inbound call from patient stating that she needed to cancel her colonoscopy for tomorrow 1/29 at 8:30 due to starting a new job and not being able to take off.   Patient states she will call back at a later time to reschedule.

## 2023-11-16 ENCOUNTER — Encounter: Payer: Medicaid Other | Admitting: Internal Medicine

## 2023-12-03 ENCOUNTER — Telehealth (HOSPITAL_BASED_OUTPATIENT_CLINIC_OR_DEPARTMENT_OTHER): Payer: Self-pay | Admitting: Physical Therapy

## 2023-12-03 NOTE — Telephone Encounter (Signed)
LVM returning call about eval to be scheduled 1/17 in the PM.. advised that day is full right now, new invite sent to MyChart & welcome to reach out.   Lynnsey Barbara C. Soraida Vickers PT, DPT 12/03/23 8:51 AM

## 2023-12-05 DIAGNOSIS — F4321 Adjustment disorder with depressed mood: Secondary | ICD-10-CM | POA: Diagnosis not present

## 2023-12-12 ENCOUNTER — Other Ambulatory Visit: Payer: Self-pay

## 2023-12-12 DIAGNOSIS — E1149 Type 2 diabetes mellitus with other diabetic neurological complication: Secondary | ICD-10-CM

## 2023-12-12 MED ORDER — METFORMIN HCL ER 500 MG PO TB24: ORAL_TABLET | ORAL | 2 refills | Status: AC

## 2023-12-16 NOTE — Therapy (Addendum)
 OUTPATIENT PHYSICAL THERAPY THORACOLUMBAR EVALUATION   Patient Name: Jordan Ramirez MRN: [id number] DOB:1960-12-08, 63 y.o., female Today's Date: 12/19/2023  END OF SESSION:  PT End of Session - 12/19/23 0843     Visit Number 1             Past Medical History:  Diagnosis Date   Arthritis    Chronic low back pain    Diabetes mellitus without complication (HCC)    Hypertension    Neuromuscular disorder [facility]) July 2019   Treatment by Orthopedic specialist   Obesity    Routine general medical examination at a health care facility 07/13/2023   Past Surgical History:  Procedure Laterality Date   PILONIDAL CYST EXCISION     WISDOM TOOTH EXTRACTION     Patient Active Problem List   Diagnosis Date Noted   Routine general medical examination at a health care facility 07/13/2023   Peripheral polyneuropathy 05/25/2022   Chronic bilateral low back pain 05/25/2022   Mixed incontinence urge and stress 02/13/2020   Bilateral lower extremity edema 02/13/2020   Ganglion cyst of dorsum of right wrist 01/16/2018   Bilateral knee pain 04/17/2012   Pain of left heel 04/17/2012   Diabetes mellitus type 2 with neurological manifestations (HCC) 08/28/2009   THUMB PAIN, LEFT 08/27/2009   Hyperlipemia 08/20/2009   Morbid obesity (HCC)-BMI 37.4. Co morbilities HTN,DM II,HLD,OA-urine incontinence. 08/20/2009   ANEMIA-NOS 08/20/2009   Essential hypertension 08/20/2009   Allergic rhinitis 08/20/2009   Asthma 08/20/2009   MENORRHAGIA, PERIMENOPAUSAL 08/20/2009   DEGENERATIVE JOINT DISEASE, KNEE 08/20/2009   TENDINITIS, LEFT THUMB 08/20/2009    PCP: [place], Betty G, MD   REFERRING PROVIDER: [place], Betty G, MD   REFERRING DIAG:  Chronic right-sided low back pain, unspecified whether sciatica present [M54.50, G89.29]   Rationale for Evaluation and Treatment: Rehabilitation  THERAPY DIAG:  Other low back pain  Other abnormalities of gait and mobility  Muscle weakness  (generalized)  ONSET DATE: 2019 started with intermittent flare up's since.   SUBJECTIVE:                                                                                                                                                                                           SUBJECTIVE STATEMENT:  [name] is a 63 y.o. female with acute on chronic LBP with sciatica. Most recent event has been present for 2-3 weeks. She currently has tingling into L LE with numbness in great toe. Laying down on right side causes pain on L side. She notes difficulty with standing for increased periods of time in the kitchen. She would like to get  back to her active lifestyle without any ailments.    She has not taken anything OTC for the pain.  PERTINENT HISTORY:  Arthritis, DM II, HTN  PAIN:  Are you having pain? Yes: NPRS scale: 4-5/10 Pain location: Bilat LB and into L LE.  Pain description: Achy, throbbing pain Aggravating factors: Laying down on R side, standing for prolonged periods of time when cooking.  Relieving factors: Stretching, sitting down.   PRECAUTIONS: None  RED FLAGS: None   WEIGHT BEARING RESTRICTIONS: No  FALLS:  Has patient fallen in last 6 months? No  LIVING ENVIRONMENT: Lives with: lives alone Lives in: House/apartment Stairs: Yes: Internal: 1 flight steps; on right going up and External: 2-3 steps; on right going up Has following equipment at home: None  OCCUPATION: Unemployed   PLOF: Independent  PATIENT GOALS: Pt would like to be able to move without continued pain.   NEXT MD VISIT: None scheduled to date.   OBJECTIVE:  Note: Objective measures were completed at Evaluation unless otherwise noted.  DIAGNOSTIC FINDINGS:  1. Progressive degenerative disc disease and facet hypertrophy in the lower lumbar spine. 2. Grade 1 anterolisthesis of L4 on L5, slightly increased from 2019. 3. Degenerative change of the sacroiliac joints, also  progressed.  PATIENT SURVEYS:  Modified Oswestry 18/50 36%    COGNITION: Overall cognitive status: Within functional limits for tasks assessed     SENSATION: WFL  POSTURE: rounded shoulders and decreased lumbar lordosis  PALPATION: None per pt.   LUMBAR ROM:  Pt has normal ROM within her lumbar spine. She reports a tightness with lateral flexion to the R.    LOWER EXTREMITY ROM:   ALL OTHER ROM WFL.   Active  Right eval Left eval  Hip flexion    Hip extension    Hip abduction    Hip adduction    Hip internal rotation    Hip external rotation    Knee flexion 87 seated 90 seated  Knee extension     (Blank rows = not tested)  LOWER EXTREMITY MMT:    MMT Right eval Left eval  Hip flexion 4 3- P!  Hip extension -8 lacking - 8 lacking   Hip abduction    Hip adduction    Knee flexion 4 4  Knee extension 4- 3+ P!   (Blank rows = not tested)   FUNCTIONAL TESTS:  5 times sit to stand: 16.97 sec Timed up and go (TUG): 11.39 sec  GAIT: Distance walked: 72ft  Assistive device utilized: None Level of assistance: Complete Independence Comments: Pt has a flexed posture and slight trunk lean to the R side. She lacks terminal knee extension.   TREATMENT DATE: Creating, reviewing, and completing below HEP                                                                                                                                PATIENT EDUCATION:  Education details:  Educated pt on anatomy and physiology of current symptoms, FOTO, diagnosis, prognosis, HEP,  and POC. Person educated: Patient Education method: Medical illustrator Education comprehension: verbalized understanding and returned demonstration  HOME EXERCISE PROGRAM: Access Code: [email or web address] URL: [email or web address] Date: 12/19/2023 Prepared by: Royal Hawthorn  Exercises - Supine Lower Trunk Rotation  - 1 x daily - 7 x weekly - 3 sets - 10 reps - Supine Posterior Pelvic Tilt  -  1 x daily - 7 x weekly - 3 sets - 10 reps - Supine Bridge  - 1 x daily - 7 x weekly - 3 sets - 10 reps - Seated Hamstring Stretch  - 1 x daily - 7 x weekly - 3 sets - 10 reps   ASSESSMENT:  CLINICAL IMPRESSION: Patient is a 63 y.o F referred to PT for chronic lower back pain with sciatica. Pt ambulates with a flexed trunk and slight R trunk lean. She has decreased stride length and lacks terminal knee extension. She demonstrates a weak core and quick onset of fatigue with mobility exercises today. Pt educated on importance of mobility and strengthening. Patient will benefit from skilled PT to address below impairments, limitations and improve overall function.  OBJECTIVE IMPAIRMENTS: decreased activity tolerance, difficulty walking, decreased balance, decreased endurance, decreased mobility, decreased ROM, decreased strength, impaired flexibility, impaired UE/LE use, postural dysfunction, and pain.  ACTIVITY LIMITATIONS: bending, lifting, carry, locomotion, cleaning, community activity, driving, and or occupation  PERSONAL FACTORS:  Arthritis, DM II, HTN are also affecting patient's functional outcome.  REHAB POTENTIAL: Good  CLINICAL DECISION MAKING: Stable/uncomplicated  EVALUATION COMPLEXITY: Low    GOALS: Short term PT Goals Target date: 01/09/2024 Pt will be I and compliant with HEP. Baseline:  Goal status: New Pt will decrease pain by 25% overall Baseline: Goal status: New  Long term PT goals Target date: 02/20/2024 Pt will improve ROM to [facility] to improve functional mobility Baseline: Goal status: New Pt will improve  hip/knee strength to at least 4+/5 MMT to improve functional strength Baseline: Goal status: New Pt will improve OSWESTRY to at least 11% functional to show improved function Baseline: Goal status: New Pt will reduce pain by overall 50% overall with usual activity Baseline: Goal status: New Pt will be able to negotiate stairs with a reciprocal gait  pattern with use of 1x hand rail if needed. Baseline: Goal status: New Pt will be able to ambulate community distances at least 1000 ft WNL gait pattern without complaints Baseline: Goal status: New     7. Pt will improve her STS by 2.3 seconds for Minimal clinically important difference.         Baseline:          Goal status: NEW  PLAN: PT FREQUENCY: 1-2times per week   PT DURATION: 6-8 weeks  PLANNED INTERVENTIONS (unless contraindicated): aquatic PT, Canalith repositioning, cryotherapy, Electrical stimulation, Iontophoresis with 4 mg/ml dexamethasome, Moist heat, traction, Ultrasound, gait training, Therapeutic exercise, balance training, neuromuscular re-education, patient/family education, prosthetic training, manual techniques, passive ROM, dry needling, taping, vasopnuematic device, vestibular, spinal manipulations, joint manipulations  PLAN FOR NEXT SESSION: Assess HEP/update PRN, continue to progress functional mobility, strengthen proximal hip muscles and core. Decrease patients pain.   For all possible CPT codes, reference the Planned Interventions line above.     Check all conditions that are expected to impact treatment: {Conditions expected to impact treatment:Morbid obesity, Musculoskeletal disorders, and Structural or anatomic abnormalities   If treatment provided at initial evaluation, no treatment charged due  to lack of authorization.     Champ Mungo, PT 12/19/2023, 9:46 AM

## 2023-12-19 ENCOUNTER — Encounter (HOSPITAL_BASED_OUTPATIENT_CLINIC_OR_DEPARTMENT_OTHER): Payer: Self-pay | Admitting: Physical Therapy

## 2023-12-19 ENCOUNTER — Encounter: Payer: Self-pay | Admitting: Obstetrics and Gynecology

## 2023-12-19 ENCOUNTER — Other Ambulatory Visit (HOSPITAL_COMMUNITY)
Admission: RE | Admit: 2023-12-19 | Discharge: 2023-12-19 | Disposition: A | Discharge status: Home / Self Care | Source: Ambulatory Visit | Attending: Obstetrics and Gynecology | Admitting: Obstetrics and Gynecology

## 2023-12-19 ENCOUNTER — Ambulatory Visit: Payer: Medicaid Other | Admitting: Obstetrics and Gynecology

## 2023-12-19 ENCOUNTER — Ambulatory Visit (HOSPITAL_BASED_OUTPATIENT_CLINIC_OR_DEPARTMENT_OTHER): Payer: Medicaid Other | Attending: Family Medicine | Admitting: Physical Therapy

## 2023-12-19 ENCOUNTER — Other Ambulatory Visit: Payer: Self-pay

## 2023-12-19 VITALS — BP 131/76 | HR 84 | Ht 67.0 in | Wt 237.0 lb

## 2023-12-19 DIAGNOSIS — Z1339 Encounter for screening examination for other mental health and behavioral disorders: Secondary | ICD-10-CM

## 2023-12-19 DIAGNOSIS — M25561 Pain in right knee: Secondary | ICD-10-CM | POA: Insufficient documentation

## 2023-12-19 DIAGNOSIS — M545 Low back pain, unspecified: Secondary | ICD-10-CM | POA: Insufficient documentation

## 2023-12-19 DIAGNOSIS — Z01419 Encounter for gynecological examination (general) (routine) without abnormal findings: Secondary | ICD-10-CM

## 2023-12-19 DIAGNOSIS — R2689 Other abnormalities of gait and mobility: Secondary | ICD-10-CM | POA: Diagnosis present

## 2023-12-19 DIAGNOSIS — G8929 Other chronic pain: Secondary | ICD-10-CM | POA: Diagnosis not present

## 2023-12-19 DIAGNOSIS — M25562 Pain in left knee: Secondary | ICD-10-CM | POA: Insufficient documentation

## 2023-12-19 DIAGNOSIS — M6281 Muscle weakness (generalized): Secondary | ICD-10-CM | POA: Insufficient documentation

## 2023-12-19 DIAGNOSIS — M5459 Other low back pain: Secondary | ICD-10-CM | POA: Insufficient documentation

## 2023-12-19 NOTE — Progress Notes (Signed)
 ANNUAL EXAM Patient name: Jordan Ramirez MRN [id number]  Date of birth: Dec 18, 1960 Chief Complaint:   NEW PATIEN/ANNUAL  History of Present Illness:   Jordan Ramirez is a 63 y.o. menopausal G1P0010 being seen today for a routine annual exam.  Current complaints: none  Last pap 01/23/16. Results were: NILM w/ HRHPV negative. H/O abnormal pap: no Last mammogram: 09/23/23. Results were: normal. Family h/o breast cancer: yes grandmother Last colonoscopy: never     10/03/2023    2:50 PM 07/13/2023    1:57 PM 05/25/2022    2:04 PM 02/09/2022    1:50 PM 05/29/2021    4:38 PM  Depression screen PHQ 2/9  Decreased Interest 0 0 0 0 0  Down, Depressed, Hopeless 0 1 0 1 0  PHQ - 2 Score 0 1 0 1 0  Altered sleeping  1  1   Tired, decreased energy  0  1   Change in appetite  0  0   Feeling bad or failure about yourself   0  1   Trouble concentrating  0  0   Moving slowly or fidgety/restless  0  0   Suicidal thoughts  0  0   PHQ-9 Score  2  4   Difficult doing work/chores  Somewhat difficult  Somewhat difficult         12/19/2023    2:52 PM 07/13/2023    1:57 PM 02/13/2020   11:06 AM  GAD 7 : Generalized Anxiety Score  Nervous, Anxious, on Edge 0 1 0  Control/stop worrying 0 1 0  Worry too much - different things 0 0 0  Trouble relaxing 0 0 0  Restless 0 0 0  Easily annoyed or irritable 0 0 1  Afraid - awful might happen 0 0 0  Total GAD 7 Score 0 2 1  Anxiety Difficulty Not difficult at all Somewhat difficult Somewhat difficult     Review of Systems:   Pertinent items are noted in HPI Denies any headaches, blurred vision, fatigue, shortness of breath, chest pain, abdominal pain, abnormal vaginal discharge/itching/odor/irritation, problems with periods, bowel movements, urination, or intercourse unless otherwise stated above. Pertinent History Reviewed:  Reviewed past medical,surgical, social and family history.  Reviewed problem list, medications and allergies. Physical Assessment:    Vitals:   12/19/23 1442 12/19/23 1449  BP: (!) 155/78 131/76  Pulse: 86 84  Weight: 237 lb (107.5 kg)   Height: 5\' 7"  (1.702 m)   Body mass index is 37.12 kg/m.        Physical Examination:   General appearance - well appearing, and in no distress  Mental status - alert, oriented to person, place, and time  Chest - respiratory effort normal  Heart - normal peripheral perfusion  Breasts - breasts appear normal, no suspicious masses, no skin or nipple changes or axillary nodes  Abdomen - soft, nontender, nondistended, no masses or organomegaly  Pelvic - VULVA: normal appearing vulva with no masses, tenderness or lesions  VAGINA: normal appearing vagina with normal color and discharge, no lesions  CERVIX: normal appearing cervix without discharge or lesions, no CMT  Thin prep pap is done with HR HPV cotesting  UTERUS: uterus is felt to be normal size, shape, consistency and nontender   ADNEXA: No adnexal masses or tenderness noted.  Chaperone present for exam  No results found for this or any previous visit (from the past 24 hours).  Assessment & Plan:  1) Well-Woman Exam Mammogram: in 1  year Colonoscopy: schedule screening colonoscopy as soon as possible Pap: Collected GC/CT: Collected HIV/HCV: Declined Declined tdap today Discussed recommendation for pneumococcal & covid vaccines Eye exam is scheduled (DM)  Labs/procedures today:   Orders Placed This Encounter  Procedures   Ambulatory referral to Gastroenterology   Meds: No orders of the defined types were placed in this encounter.  Follow-up: Return in about 1 year (around 12/18/2024) for annual exam or sooner as needed.  Lennart Pall, MD 12/19/2023 8:01 PM

## 2023-12-19 NOTE — Patient Instructions (Signed)
 It was nice meeting you today. You will see your results in the MyChart app within 1 week.

## 2023-12-20 LAB — CERVICOVAGINAL ANCILLARY ONLY
Chlamydia: NEGATIVE
Comment: NEGATIVE
Comment: NEGATIVE
Comment: NORMAL
Neisseria Gonorrhea: NEGATIVE
Trichomonas: NEGATIVE

## 2023-12-21 LAB — CYTOLOGY - PAP
Comment: NEGATIVE
Diagnosis: NEGATIVE
High risk HPV: NEGATIVE

## 2023-12-22 ENCOUNTER — Ambulatory Visit: Payer: Self-pay | Admitting: Family Medicine

## 2023-12-22 NOTE — Telephone Encounter (Signed)
 Copied from CRM [id number]. Topic: Clinical - Red Word Triage >> Dec 22, 2023  3:23 PM Dondra Prader E wrote: Red Word that prompted transfer to Nurse Triage: Elevated BP   "170/something" she says  Chief Complaint: elevated bp Symptoms: bp elevated Frequency: comes and goes Pertinent Negatives: Patient denies headache, numbness, tingling, dizziness, blurred vision Disposition: [] ED /[] Urgent Care (no appt availability in office) / [x] Appointment(In office/virtual)/ []  [facility]/ [] Home Care/ [] Refused Recommended Disposition /[] [facility]/ []  Follow-up with PCP Additional Notes: per protocol, apt made for Monday.  Care advice given and instructed to go to ER if becomes worse.   Reason for Disposition  Systolic BP  >= 160 OR Diastolic >= 100  Answer Assessment - Initial Assessment Questions 1. BLOOD PRESSURE: "What is the blood pressure?" "Did you take at least two measurements 5 minutes apart?"     "170/something" 2. ONSET: "When did you take your blood pressure?"     Within the last month after she ate something 3. HOW: "How did you take your blood pressure?" (e.g., automatic home BP monitor, visiting nurse)     automatic 4. HISTORY: "Do you have a history of high blood pressure?"     yes 5. MEDICINES: "Are you taking any medicines for blood pressure?" "Have you missed any doses recently?"     It's rare that it happens 6. OTHER SYMPTOMS: "Do you have any symptoms?" (e.g., blurred vision, chest pain, difficulty breathing, headache, weakness)     Denies. 7. PREGNANCY: "Is there any chance you are pregnant?" "When was your last menstrual period?"     na  Protocols used: Blood Pressure - High-A-AH

## 2023-12-23 NOTE — Telephone Encounter (Signed)
 Noted.

## 2023-12-25 ENCOUNTER — Encounter: Payer: Self-pay | Admitting: Obstetrics and Gynecology

## 2023-12-28 ENCOUNTER — Encounter: Payer: Self-pay | Admitting: Family Medicine

## 2023-12-28 ENCOUNTER — Ambulatory Visit: Admitting: Family Medicine

## 2023-12-28 ENCOUNTER — Ambulatory Visit (HOSPITAL_BASED_OUTPATIENT_CLINIC_OR_DEPARTMENT_OTHER): Admitting: Physical Therapy

## 2023-12-28 ENCOUNTER — Encounter (HOSPITAL_BASED_OUTPATIENT_CLINIC_OR_DEPARTMENT_OTHER): Payer: Self-pay | Admitting: Physical Therapy

## 2023-12-28 VITALS — BP 140/85 | HR 88 | Resp 16 | Ht 67.0 in | Wt 232.0 lb

## 2023-12-28 DIAGNOSIS — E1149 Type 2 diabetes mellitus with other diabetic neurological complication: Secondary | ICD-10-CM | POA: Diagnosis not present

## 2023-12-28 DIAGNOSIS — E785 Hyperlipidemia, unspecified: Secondary | ICD-10-CM

## 2023-12-28 DIAGNOSIS — J3089 Other allergic rhinitis: Secondary | ICD-10-CM | POA: Diagnosis not present

## 2023-12-28 DIAGNOSIS — M5459 Other low back pain: Secondary | ICD-10-CM | POA: Diagnosis not present

## 2023-12-28 DIAGNOSIS — Z7984 Long term (current) use of oral hypoglycemic drugs: Secondary | ICD-10-CM | POA: Diagnosis not present

## 2023-12-28 DIAGNOSIS — I10 Essential (primary) hypertension: Secondary | ICD-10-CM

## 2023-12-28 DIAGNOSIS — R2689 Other abnormalities of gait and mobility: Secondary | ICD-10-CM

## 2023-12-28 DIAGNOSIS — M6281 Muscle weakness (generalized): Secondary | ICD-10-CM

## 2023-12-28 MED ORDER — FLUTICASONE PROPIONATE 50 MCG/ACT NA SUSP: 1.0000 | Freq: Two times a day (BID) | NASAL | 3 refills | Status: DC

## 2023-12-28 MED ORDER — DEXCOM G6 TRANSMITTER MISC: 1.0000 | 3 refills | Status: DC

## 2023-12-28 MED ORDER — DEXCOM G6 SENSOR MISC: 1 refills | Status: DC

## 2023-12-28 MED ORDER — AMLODIPINE-OLMESARTAN 10-40 MG PO TABS: 1.0000 | ORAL_TABLET | Freq: Every day | ORAL | 1 refills | Status: DC

## 2023-12-28 NOTE — Therapy (Signed)
 OUTPATIENT PHYSICAL THERAPY THORACOLUMBAR TREATMENT   Patient Name: Jordan Ramirez MRN: [id number] DOB:11-15-1960, 63 y.o., female Today's Date: 12/28/2023  END OF SESSION:  PT End of Session - 12/28/23 1100     Visit Number 2    Number of Visits 12    Date for PT Re-Evaluation 02/20/24    Authorization Type [place] MEDICAID PREPAID HEALTH PLAN    PT Start Time 1100    PT Stop Time 1138    PT Time Calculation (min) 38 min    Behavior During Therapy [facility] for tasks assessed/performed             Past Medical History:  Diagnosis Date   Anemia    Anxiety    Arthritis    Asthma    Chronic low back pain    Diabetes mellitus without complication (HCC)    Fibroid    Hypertension    Neuromuscular disorder (HCC) July 2019   Treatment by Orthopedic specialist   Obesity    Routine general medical examination at a health care facility 07/13/2023   Past Surgical History:  Procedure Laterality Date   PILONIDAL CYST EXCISION     WISDOM TOOTH EXTRACTION     Patient Active Problem List   Diagnosis Date Noted   Routine general medical examination at a health care facility 07/13/2023   Peripheral polyneuropathy 05/25/2022   Chronic bilateral low back pain 05/25/2022   Mixed incontinence urge and stress 02/13/2020   Bilateral lower extremity edema 02/13/2020   Ganglion cyst of dorsum of right wrist 01/16/2018   Bilateral knee pain 04/17/2012   Pain of left heel 04/17/2012   Diabetes mellitus type 2 with neurological manifestations (HCC) 08/28/2009   THUMB PAIN, LEFT 08/27/2009   Hyperlipemia 08/20/2009   Morbid obesity (HCC)-BMI 37.4. Co morbilities HTN,DM II,HLD,OA-urine incontinence. 08/20/2009   ANEMIA-NOS 08/20/2009   Essential hypertension 08/20/2009   Allergic rhinitis 08/20/2009   Asthma 08/20/2009   MENORRHAGIA, PERIMENOPAUSAL 08/20/2009   DEGENERATIVE JOINT DISEASE, KNEE 08/20/2009   TENDINITIS, LEFT THUMB 08/20/2009    PCP: [place], Betty G, MD   REFERRING  PROVIDER: [place], Betty G, MD   REFERRING DIAG:  Chronic right-sided low back pain, unspecified whether sciatica present [M54.50, G89.29]   Rationale for Evaluation and Treatment: Rehabilitation  THERAPY DIAG:  Other low back pain  Other abnormalities of gait and mobility  Muscle weakness (generalized)  ONSET DATE: 2019 started with intermittent flare up's since.   SUBJECTIVE:  SUBJECTIVE STATEMENT: "I'm still feeling the aggravation (in back)." Pt reports compliance with HEP.  She was previously a member of [employer], and would like to return to working out at [employer].    PERTINENT HISTORY:  Arthritis, DM II, HTN  PAIN:  Are you having pain? Yes: NPRS scale: 2-3/10 Pain location: Bilat LB and into L LE, numbness in L big toe.  Pain description: Achy, throbbing pain Aggravating factors: Laying down on R side, standing for prolonged periods of time when cooking.  Relieving factors: Stretching, sitting down.   PRECAUTIONS: None  RED FLAGS: None   WEIGHT BEARING RESTRICTIONS: No  FALLS:  Has patient fallen in last 6 months? No  LIVING ENVIRONMENT: Lives with: lives alone Lives in: House/apartment Stairs: Yes: Internal: 1 flight steps; on right going up and External: 2-3 steps; on right going up Has following equipment at home: None  OCCUPATION: Unemployed   PLOF: Independent  PATIENT GOALS: Pt would like to be able to move without continued pain.   NEXT MD VISIT: None scheduled to date.   OBJECTIVE:  Note: Objective measures were completed at Evaluation unless otherwise noted.  DIAGNOSTIC FINDINGS:  1. Progressive degenerative disc disease and facet hypertrophy in the lower lumbar spine. 2. Grade 1 anterolisthesis of L4 on L5, slightly increased from 2019. 3. Degenerative  change of the sacroiliac joints, also progressed.  PATIENT SURVEYS:  Modified Oswestry 18/50 36%    COGNITION: Overall cognitive status: Within functional limits for tasks assessed     SENSATION: WFL  POSTURE: rounded shoulders and decreased lumbar lordosis  PALPATION: None per pt.   LUMBAR ROM:  Pt has normal ROM within her lumbar spine. She reports a tightness with lateral flexion to the R.    LOWER EXTREMITY ROM:   ALL OTHER ROM WFL.   Active  Right eval Left eval  Hip flexion    Hip extension    Hip abduction    Hip adduction    Hip internal rotation    Hip external rotation    Knee flexion 87 seated 90 seated  Knee extension     (Blank rows = not tested)  LOWER EXTREMITY MMT:    MMT Right eval Left eval  Hip flexion 4 3- P!  Hip extension -8 lacking - 8 lacking   Hip abduction    Hip adduction    Knee flexion 4 4  Knee extension 4- 3+ P!   (Blank rows = not tested)   FUNCTIONAL TESTS:  5 times sit to stand: 16.97 sec Timed up and go (TUG): 11.39 sec  GAIT: Distance walked: 68ft  Assistive device utilized: None Level of assistance: Complete Independence Comments: Pt has a flexed posture and slight trunk lean to the R side. She lacks terminal knee extension.   [facility] Adult PT Treatment:                                                DATE: 12/28/23  Therapeutic Exercise: NuStep, L6, UE/LE x 6 min  Seated Hamstring stretch, 15 sec x 3 each leg, cues for posture Bridge, 5 sec hold x 10 Post pelvic press -> cues for performance -> switched to neutral spine and TrA set Supine TrA set with lap press x 5 sec x 10 Lower trunk rotation x 5 each direction Single knee to chest stretch x 10s  x 2 reps each LE Seated TrA set with lap press x 5 sec (preferred)  Therapeutic Activity: STS from mat table, cues for forward arm reach and hip hinge x 5                                                                                                                          PATIENT EDUCATION:  Education details: reviewed HEP; modified / updated HEP Person educated: Patient Education method: Explanation and Demonstration Education comprehension: verbalized understanding and returned demonstration  HOME EXERCISE PROGRAM: Access Code: [email or web address] URL: [email or web address]   ASSESSMENT:  CLINICAL IMPRESSION:  Pt tolerated all exercises well, without increase in pain.  She required occasional cues for form and posture.  Goals are ongoing. Added 2 visits in pool to establish aquatic therapy HEP, for use once she gains access to [employer].  Patient will benefit from skilled PT to address below impairments, limitations and improve overall function.    From initial evaluation: Patient is a 63 y.o F referred to PT for chronic lower back pain with sciatica. Pt ambulates with a flexed trunk and slight R trunk lean. She has decreased stride length and lacks terminal knee extension. She demonstrates a weak core and quick onset of fatigue with mobility exercises today. Pt educated on importance of mobility and strengthening. Patient will benefit from skilled PT to address below impairments, limitations and improve overall function.  OBJECTIVE IMPAIRMENTS: decreased activity tolerance, difficulty walking, decreased balance, decreased endurance, decreased mobility, decreased ROM, decreased strength, impaired flexibility, impaired UE/LE use, postural dysfunction, and pain.  ACTIVITY LIMITATIONS: bending, lifting, carry, locomotion, cleaning, community activity, driving, and or occupation  PERSONAL FACTORS:  Arthritis, DM II, HTN are also affecting patient's functional outcome.  REHAB POTENTIAL: Good  CLINICAL DECISION MAKING: Stable/uncomplicated  EVALUATION COMPLEXITY: Low    GOALS: Short term PT Goals Target date: 01/09/2024 Pt will be I and compliant with HEP. Baseline:  Goal status: New Pt will decrease pain by 25% overall Baseline: Goal  status: New  Long term PT goals Target date: 02/20/2024 Pt will improve ROM to [facility] to improve functional mobility Baseline: Goal status: New Pt will improve  hip/knee strength to at least 4+/5 MMT to improve functional strength Baseline: Goal status: New Pt will improve OSWESTRY to at least 11% functional to show improved function Baseline: Goal status: New Pt will reduce pain by overall 50% overall with usual activity Baseline: Goal status: New Pt will be able to negotiate stairs with a reciprocal gait pattern with use of 1x hand rail if needed. Baseline: Goal status: New Pt will be able to ambulate community distances at least 1000 ft WNL gait pattern without complaints Baseline: Goal status: New     7. Pt will improve her STS by 2.3 seconds for Minimal clinically important difference.         Baseline:          Goal status: NEW  PLAN: PT FREQUENCY: 1-2times per week  PT DURATION: 6-8 weeks  PLANNED INTERVENTIONS (unless contraindicated): aquatic PT, Canalith repositioning, cryotherapy, Electrical stimulation, Iontophoresis with 4 mg/ml dexamethasome, Moist heat, traction, Ultrasound, gait training, Therapeutic exercise, balance training, neuromuscular re-education, patient/family education, prosthetic training, manual techniques, passive ROM, dry needling, taping, vasopnuematic device, vestibular, spinal manipulations, joint manipulations  PLAN FOR NEXT SESSION: Assess HEP/update PRN, continue to progress functional mobility, strengthen proximal hip muscles and core. Decrease patients pain.   For all possible CPT codes, reference the Planned Interventions line above.     Check all conditions that are expected to impact treatment: {Conditions expected to impact treatment:Morbid obesity, Musculoskeletal disorders, and Structural or anatomic abnormalities   If treatment provided at initial evaluation, no treatment charged due to lack of authorization.   Mayer Camel,  PTA 12/28/23 12:07 PM [facility] [address] [place], [place], [address] Phone: [phone]   Fax:  [phone]

## 2023-12-28 NOTE — Progress Notes (Signed)
 Chief Complaint  Patient presents with   Hypertension    Top number is running anywhere from 130-150    HPI: [name] is a 63 y.o. female with a PMHx significant for HTN, HLD, DM II, and peripheral neuropathy, who is here today for hypertension management.  Last seen on 10/03/2023 Since her last visit she has seen her gynecologist and psychotherapist.  Hypertension:  Medications: Currently on amlodipine 10 mg daily and lisinopril 30 mg daily.  Her systolic blood pressure readings at home have been in the 130s to 150s for awhile. Her diastolic readings are also occasionally elevated.  Diet: She is cooking her meals at home and trying to avoid salt. She has been snacking on some sweets and potato chips lately, as well as pears, blueberries, blackberries, strawberries, and bananas in smoothies. She has seen a nutritionist in the past but did not feel it was very helpful.  Side effects: none Negative for unusual or severe headache, visual changes, exertional chest pain, dyspnea,  focal weakness, or edema.  Lab Results  Component Value Date   CREATININE 1.03 07/14/2023   BUN 7 07/14/2023   NA 142 07/14/2023   K 3.6 07/14/2023   CL 105 07/14/2023   CO2 28 07/14/2023   Hyperlipidemia: Currently on rosuvastatin 10 mg daily.  Side effects from medication: none Lab Results  Component Value Date   CHOL 218 (H) 07/14/2023   HDL 69.00 07/14/2023   LDLCALC 132 (H) 07/14/2023   LDLDIRECT 118.5 04/17/2012   TRIG 84.0 07/14/2023   CHOLHDL 3 07/14/2023   Diabetes Mellitus II: Dx'ed in 04/2012.  - Medications: Currently on Metformin 500 mg with breakfast and 1000 mg at supper.  - Negative for symptoms of hypoglycemia, polyuria, polydipsia, foot ulcers/trauma + Peripheral neuropathy. b Results  Component Value Date   HGBA1C 6.0 07/13/2023   Lab Results  Component Value Date   MICROALBUR <0.7 07/19/2023   -Patient also complains of persistent nasal congestion since January.  She had some body aches, fever, chills, and other URI symptoms when the congestion initially started in January, but those have since resolved.  She has been taking Coricidin HBP which has not helped very much. Not taking any OTC antihistamines or nasal sprays.   Review of Systems  Constitutional:  Negative for activity change, appetite change, chills and fever.  HENT:  Positive for postnasal drip. Negative for mouth sores and sore throat.   Respiratory:  Negative for cough and wheezing.   Gastrointestinal:  Negative for abdominal pain, nausea and vomiting.  Genitourinary:  Negative for decreased urine volume, dysuria and hematuria.  Skin:  Negative for rash.  Allergic/Immunologic: Positive for environmental allergies.  Neurological:  Negative for syncope and facial asymmetry.  Psychiatric/Behavioral:  Negative for confusion and hallucinations.   See other pertinent positives and negatives in HPI.  Current Outpatient Medications on File Prior to Visit  Medication Sig Dispense Refill   Blood Glucose Monitoring Suppl (ONETOUCH VERIO) w/Device KIT Use to test blood sugars once daily. 1 kit 1   Cholecalciferol (VITAMIN D3 PO) Take 1 tablet by mouth daily.     glucose blood (ONETOUCH VERIO) test strip Use to test blood sugars once daily. 100 each 12   Lancets (ONETOUCH ULTRASOFT) lancets Use to test blood sugars once daily. 100 each 12   metFORMIN (GLUCOPHAGE-XR) 500 MG 24 hr tablet Take 1500 mg by mouth daily (500 mg with breakfast and 1000 mg with supper) 270 tablet 2   Multiple Vitamins-Minerals (ZINC PO)  Take 1 tablet by mouth daily.     rosuvastatin (CRESTOR) 10 MG tablet Take 1 tablet (10 mg total) by mouth daily. 90 tablet 3   tiZANidine (ZANAFLEX) 4 MG tablet Take 0.5-1 tablets (2-4 mg total) by mouth at bedtime as needed for muscle spasms. 30 tablet 1   No current facility-administered medications on file prior to visit.   Past Medical History:  Diagnosis Date   Anemia    Anxiety     Arthritis    Asthma    Chronic low back pain    Diabetes mellitus without complication (HCC)    Fibroid    Hypertension    Neuromuscular disorder 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, academic  Tobacco Use   Smoking status: Never   Smokeless tobacco: Never  Advertising account planner   Vaping status: Never Used  Substance and Sexual Activity   Alcohol use: Not Currently   Drug use: No   Sexual activity: Not Currently    Birth control/protection: Post-menopausal, None  Other Topics Concern   Not on file  Social History Narrative   Not on file   Social Drivers of Health   Financial Resource Strain: High Risk (09/30/2023)   Overall Financial Resource Strain (CARDIA)    Difficulty of Paying Living Expenses: Very hard  Food Insecurity: Food Insecurity Present (09/30/2023)   Hunger Vital Sign    Worried About Running Out of Food in the Last Year: Often true    Ran Out of Food in the Last Year: Sometimes true  Transportation Needs: Unmet Transportation Needs (09/30/2023)   PRAPARE - Transportation    Lack of Transportation (Medical): Yes    Lack of Transportation (Non-Medical): Yes  Physical Activity: Insufficiently Active (09/30/2023)   Exercise Vital Sign    Days of Exercise per Week: 1 day    Minutes of Exercise per Session: 20 min  Stress: No Stress Concern Present (09/30/2023)   [employer] of Occupational Health - Occupational Stress Questionnaire    Feeling of Stress : Only a little  Social Connections:  Moderately Integrated (09/30/2023)   Social Connection and Isolation Panel [NHANES]    Frequency of Communication with Friends and Family: More than three times a week    Frequency of Social Gatherings with Friends and Family: Once a week    Attends Religious Services: More than 4 times per year    Active Member of Clubs or Organizations: Yes    Attends Banker Meetings: More than 4 times per year    Marital Status: Never married   Today's Vitals   12/28/23 0826 12/28/23 0925  BP: (!) 142/84 (!) 140/85  Pulse: 88   Resp: 16   SpO2: 98%   Weight: 232 lb (105.2 kg)   Height: 5\' 7"  (1.702 m)    Body mass index is 36.34 kg/m.  Physical Exam Vitals and nursing note reviewed.  Constitutional:      General: She is not in acute distress.  Appearance: She is well-developed.  HENT:     Head: Normocephalic and atraumatic.     Nose:     Right Turbinates: Enlarged.     Left Turbinates: Enlarged.     Mouth/Throat:     Mouth: Mucous membranes are moist.     Pharynx: Oropharynx is clear. Uvula midline.  Eyes:     Conjunctiva/sclera: Conjunctivae normal.  Cardiovascular:     Rate and Rhythm: Normal rate and regular rhythm.     Pulses:          Posterior tibial pulses are 2+ on the right side and 2+ on the left side.     Heart sounds: No murmur heard. Pulmonary:     Effort: Pulmonary effort is normal. No respiratory distress.     Breath sounds: Normal breath sounds.  Abdominal:     Palpations: Abdomen is soft. There is no hepatomegaly or mass.     Tenderness: There is no abdominal tenderness.  Musculoskeletal:     Right lower leg: No edema.     Left lower leg: No edema.  Lymphadenopathy:     Cervical: No cervical adenopathy.  Skin:    General: Skin is warm.     Findings: No erythema or rash.  Neurological:     General: No focal deficit present.     Mental Status: She is alert and oriented to person, place, and time.     Cranial Nerves: No cranial nerve deficit.      Gait: Gait normal.  Psychiatric:        Mood and Affect: Mood and affect normal.    ASSESSMENT AND PLAN:  Ms. Mastandrea was seen today for hypertension management.   Orders Placed This Encounter  Procedures   Hemoglobin A1c   Lipid panel   Microalbumin / creatinine urine ratio   Comprehensive metabolic panel   Diabetes mellitus type 2 with neurological manifestations [facility]) Assessment & Plan: Last HgA1C 6.0 in 06/2023, not checking BS's. Continue Metformin 500 mg with breakfast and 1000 mg at supper. Regular exercise and healthy diet with avoidance of added sugar food intake is an important part of treatment and recommended. Annual eye exam, periodic dental and foot care recommended. F/U in 5-6 months.  Orders: -     Dexcom G6 Sensor; Change sensor q 10 days.  Dispense: 3 each; Refill: 1 -     Dexcom G6 Transmitter; 1 Device by Does not apply route every 3 (three) months.  Dispense: 1 each; Refill: 3 -     Hemoglobin A1c; Future -     Microalbumin / creatinine urine ratio; Future -     Comprehensive metabolic panel; Future  Essential hypertension Assessment & Plan: BP is not well controlled. Possible complications of elevated BP discussed. Changes today:Lisinopril d/c, Olmesartan started, changes to combination tab, Olmesartan-Amlodipine 40-10 mg daily. Low salt diet. Continue to monitor BP at home, let me know about BP readings in 2 weeks. Instructed about warning signs. Follow-up in 4 months, before if needed.  Orders: -     amLODIPine-Olmesartan; Take 1 tablet by mouth daily.  Dispense: 90 tablet; Refill: 1 -     Comprehensive metabolic panel; Future  Hyperlipidemia, unspecified hyperlipidemia type Assessment & Plan: Continue Rosuvastatin 10 mg daily. Low fat diet also recommended. Further recommendations according to lipid panel result.  Orders: -     Lipid panel; Future -     Comprehensive metabolic panel; Future  Allergic rhinitis due to other allergic  trigger, unspecified seasonality  Assessment & Plan: Symptoms she is reporting today could also be residual from URI in 10/2023 but certainly aggravated by seasonal changes. Recommend OTC Zyrtec 10 mg daily and Flonase nasal spray daily for 10-14 days then prn. Nasal saline irrigations as needed.  Orders: -     Fluticasone Propionate; Place 1 spray into both nostrils 2 (two) times daily.  Dispense: 16 g; Refill: 3   Return in about 4 months (around 04/28/2024) for chronic problems.  I, Rolla Etienne Wierda, acting as a scribe for Jordan Stuckey [place], MD., have documented all relevant documentation on the behalf of Jordan Delone [place], MD, as directed by  Devanshi Califf [place], MD while in the presence of Jordan Rathbun [place], MD.   I, Jalyn Dutta [place], MD, have reviewed all documentation for this visit. The documentation on 12/29/23 for the exam, diagnosis, procedures, and orders are all accurate and complete.  Shalin Linders G. [place], MD  [facility]. [place] office.

## 2023-12-28 NOTE — Patient Instructions (Addendum)
 A few things to remember from today's visit:  Diabetes mellitus type 2 with neurological manifestations (HCC) - Plan: Continuous Glucose Sensor (DEXCOM G6 SENSOR) MISC, Continuous Glucose Transmitter (DEXCOM G6 TRANSMITTER) MISC, Hemoglobin A1c, Microalbumin / creatinine urine ratio, Comprehensive metabolic panel  Essential hypertension - Plan: amLODipine-olmesartan (AZOR) 10-40 MG tablet, Comprehensive metabolic panel  Hyperlipidemia, unspecified hyperlipidemia type - Plan: Lipid panel, Comprehensive metabolic panel  Allergic rhinitis due to other allergic trigger, unspecified seasonality - Plan: fluticasone (FLONASE) 50 MCG/ACT nasal spray  Stop Lisinopril and Amlodipine. Olmesartan-Amlodipine combination tab sent to take once daily. Let me know about blood pressure readings in 4 weeks. Goal is under 130/80  Flonase nasal spray daily for 10-14 days then as needed. Nasal saline irrigations as needed.  If you need refills for medications you take chronically, please call your pharmacy. Do not use My Chart to request refills or for acute issues that need immediate attention. If you send a my chart message, it may take a few days to be addressed, specially if I am not in the office.  Please be sure medication list is accurate. If a new problem present, please set up appointment sooner than planned today.

## 2023-12-29 NOTE — Assessment & Plan Note (Signed)
Continue Rosuvastatin 10 mg daily. Low fat diet also recommended. Further recommendations according to lipid panel result.

## 2023-12-29 NOTE — Assessment & Plan Note (Addendum)
 Last HgA1C 6.0 in 06/2023, not checking BS's. Continue Metformin 500 mg with breakfast and 1000 mg at supper. Regular exercise and healthy diet with avoidance of added sugar food intake is an important part of treatment and recommended. Annual eye exam, periodic dental and foot care recommended. F/U in 5-6 months.

## 2023-12-29 NOTE — Assessment & Plan Note (Signed)
 BP is not well controlled. Possible complications of elevated BP discussed. Changes today:Lisinopril d/c, Olmesartan started, changes to combination tab, Olmesartan-Amlodipine 40-10 mg daily. Low salt diet. Continue to monitor BP at home, let me know about BP readings in 2 weeks. Instructed about warning signs. Follow-up in 4 months, before if needed.

## 2023-12-29 NOTE — Assessment & Plan Note (Signed)
 Symptoms she is reporting today could also be residual from URI in 10/2023 but certainly aggravated by seasonal changes. Recommend OTC Zyrtec 10 mg daily and Flonase nasal spray daily for 10-14 days then prn. Nasal saline irrigations as needed.

## 2023-12-30 ENCOUNTER — Telehealth: Payer: Self-pay

## 2023-12-30 ENCOUNTER — Other Ambulatory Visit (HOSPITAL_COMMUNITY): Payer: Self-pay

## 2023-12-30 NOTE — Telephone Encounter (Signed)
 Pharmacy Patient Advocate Encounter   Received notification from Onbase that prior authorization for Dexcom CGM supplies (sensor and transmitter) is required/requested.   Insurance verification completed.   The patient is insured through [facility] .   Per test claim: Pt must be treated with insulin in order for PA to be considered.

## 2024-01-06 ENCOUNTER — Telehealth: Payer: Self-pay

## 2024-01-06 MED ORDER — ACCU-CHEK GUIDE TEST VI STRP: ORAL_STRIP | 12 refills | Status: AC

## 2024-01-06 NOTE — Telephone Encounter (Signed)
 Copied from CRM [id number]. Topic: Clinical - Prescription Issue >> Jan 06, 2024 10:31 AM Drema Balzarine wrote: Reason for CRM: Patient needs the accucheck guide strips sent to her pharmacy on file, she is completely out. She requested a call back with an update.

## 2024-01-07 ENCOUNTER — Ambulatory Visit (HOSPITAL_BASED_OUTPATIENT_CLINIC_OR_DEPARTMENT_OTHER): Admitting: Physical Therapy

## 2024-01-07 DIAGNOSIS — R2689 Other abnormalities of gait and mobility: Secondary | ICD-10-CM

## 2024-01-07 DIAGNOSIS — M5459 Other low back pain: Secondary | ICD-10-CM

## 2024-01-07 DIAGNOSIS — M6281 Muscle weakness (generalized): Secondary | ICD-10-CM

## 2024-01-07 NOTE — Therapy (Signed)
 OUTPATIENT PHYSICAL THERAPY THORACOLUMBAR TREATMENT   Patient Name: Jordan Ramirez MRN: [id number] DOB:01-18-1961, 63 y.o., female Today's Date: 01/07/2024  END OF SESSION:  PT End of Session - 01/07/24 1128     Visit Number 3    Number of Visits 12    Date for PT Re-Evaluation 02/20/24    Authorization Type [place] MEDICAID PREPAID HEALTH PLAN    PT Start Time 1130    PT Stop Time 1210    PT Time Calculation (min) 40 min    Activity Tolerance Patient tolerated treatment well;Patient limited by fatigue    Behavior During Therapy [facility] for tasks assessed/performed              Past Medical History:  Diagnosis Date   Anemia    Anxiety    Arthritis    Asthma    Chronic low back pain    Diabetes mellitus without complication (HCC)    Fibroid    Hypertension    Neuromuscular disorder (HCC) July 2019   Treatment by Orthopedic specialist   Obesity    Routine general medical examination at a health care facility 07/13/2023   Past Surgical History:  Procedure Laterality Date   PILONIDAL CYST EXCISION     WISDOM TOOTH EXTRACTION     Patient Active Problem List   Diagnosis Date Noted   Routine general medical examination at a health care facility 07/13/2023   Peripheral polyneuropathy 05/25/2022   Chronic bilateral low back pain 05/25/2022   Mixed incontinence urge and stress 02/13/2020   Bilateral lower extremity edema 02/13/2020   Ganglion cyst of dorsum of right wrist 01/16/2018   Bilateral knee pain 04/17/2012   Pain of left heel 04/17/2012   Diabetes mellitus type 2 with neurological manifestations (HCC) 08/28/2009   THUMB PAIN, LEFT 08/27/2009   Hyperlipemia 08/20/2009   Morbid obesity (HCC)-BMI 37.4. Co morbilities HTN,DM II,HLD,OA-urine incontinence. 08/20/2009   ANEMIA-NOS 08/20/2009   Essential hypertension 08/20/2009   Allergic rhinitis 08/20/2009   Asthma 08/20/2009   MENORRHAGIA, PERIMENOPAUSAL 08/20/2009   DEGENERATIVE JOINT DISEASE, KNEE 08/20/2009    TENDINITIS, LEFT THUMB 08/20/2009    PCP: [place], Betty G, MD   REFERRING PROVIDER: [place], Betty G, MD   REFERRING DIAG:  Chronic right-sided low back pain, unspecified whether sciatica present [M54.50, G89.29]   Rationale for Evaluation and Treatment: Rehabilitation  THERAPY DIAG:  Other low back pain  Other abnormalities of gait and mobility  Muscle weakness (generalized)  ONSET DATE: 2019 started with intermittent flare up's since.   SUBJECTIVE:  SUBJECTIVE STATEMENT: "I am eating a lot of food with sugar and white flour in it, so I am not feeling so great lately". Other than that I am feeling okay.   PERTINENT HISTORY:  Arthritis, DM II, HTN  PAIN:  Are you having pain? Yes: NPRS scale: 2-3/10 Pain location: Bilat LB and into L LE, numbness in L big toe.  Pain description: Achy, throbbing pain Aggravating factors: Laying down on R side, standing for prolonged periods of time when cooking.  Relieving factors: Stretching, sitting down.   PRECAUTIONS: None  RED FLAGS: None   WEIGHT BEARING RESTRICTIONS: No  FALLS:  Has patient fallen in last 6 months? No  LIVING ENVIRONMENT: Lives with: lives alone Lives in: House/apartment Stairs: Yes: Internal: 1 flight steps; on right going up and External: 2-3 steps; on right going up Has following equipment at home: None  OCCUPATION: Unemployed   PLOF: Independent  PATIENT GOALS: Pt would like to be able to move without continued pain.   NEXT MD VISIT: None scheduled to date.   OBJECTIVE:  Note: Objective measures were completed at Evaluation unless otherwise noted.  DIAGNOSTIC FINDINGS:  1. Progressive degenerative disc disease and facet hypertrophy in the lower lumbar spine. 2. Grade 1 anterolisthesis of L4 on L5, slightly  increased from 2019. 3. Degenerative change of the sacroiliac joints, also progressed.  PATIENT SURVEYS:  Modified Oswestry 18/50 36%    COGNITION: Overall cognitive status: Within functional limits for tasks assessed     SENSATION: WFL  POSTURE: rounded shoulders and decreased lumbar lordosis  PALPATION: None per pt.   LUMBAR ROM:  Pt has normal ROM within her lumbar spine. She reports a tightness with lateral flexion to the R.    LOWER EXTREMITY ROM:   ALL OTHER ROM WFL.   Active  Right eval Left eval  Hip flexion    Hip extension    Hip abduction    Hip adduction    Hip internal rotation    Hip external rotation    Knee flexion 87 seated 90 seated  Knee extension     (Blank rows = not tested)  LOWER EXTREMITY MMT:    MMT Right eval Left eval  Hip flexion 4 3- P!  Hip extension -8 lacking - 8 lacking   Hip abduction    Hip adduction    Knee flexion 4 4  Knee extension 4- 3+ P!   (Blank rows = not tested)   FUNCTIONAL TESTS:  5 times sit to stand: 16.97 sec Timed up and go (TUG): 11.39 sec  GAIT: Distance walked: 83ft  Assistive device utilized: None Level of assistance: Complete Independence Comments: Pt has a flexed posture and slight trunk lean to the R side. She lacks terminal knee extension.   [facility] Adult PT Treatment:                                                Therapeutic Exercise: NuStep, L6, UE/LE x 6 min  Seated Hamstring stretch, 15 sec x 3 each leg, cues for posture Bridge, 5 sec hold 2x 10 Post pelvic press -> cues for performance -> switched to neutral spine and TrA set Supine TrA set with knee press x 5 sec x 10 Lower trunk rotation x 5 each direction LAQ x10, bilat  Seated clams with RTB x20 Seated TrA set  with lap press x 5 sec (preferred) Supine hip IR.    DATE: 12/28/23  Therapeutic Exercise: NuStep, L6, UE/LE x 6 min  Seated Hamstring stretch, 15 sec x 3 each leg, cues for posture Bridge, 5 sec hold x 10 Post pelvic  press -> cues for performance -> switched to neutral spine and TrA set Supine TrA set with lap press x 5 sec x 10 Lower trunk rotation x 5 each direction Single knee to chest stretch x 10s x 2 reps each LE Seated TrA set with lap press x 5 sec (preferred)  Therapeutic Activity: STS from mat table, cues for forward arm reach and hip hinge x 5                                                                                                                         PATIENT EDUCATION:  Education details: reviewed HEP; modified / updated HEP Person educated: Patient Education method: Explanation and Demonstration Education comprehension: verbalized understanding and returned demonstration  HOME EXERCISE PROGRAM: Access Code: [email or web address] URL: [email or web address]   ASSESSMENT:  CLINICAL IMPRESSION:  Pt tolerated all exercises well, without increase in pain. She notes some discomfort in her L knee with piriformis stretches, but minimized with postural adjustments. Pt tolerated advancements made to exercises today. Goals are ongoing. Updated HEP.  Patient will benefit from skilled PT to address below impairments, limitations and improve overall function.    From initial evaluation: Patient is a 63 y.o F referred to PT for chronic lower back pain with sciatica. Pt ambulates with a flexed trunk and slight R trunk lean. She has decreased stride length and lacks terminal knee extension. She demonstrates a weak core and quick onset of fatigue with mobility exercises today. Pt educated on importance of mobility and strengthening. Patient will benefit from skilled PT to address below impairments, limitations and improve overall function.  OBJECTIVE IMPAIRMENTS: decreased activity tolerance, difficulty walking, decreased balance, decreased endurance, decreased mobility, decreased ROM, decreased strength, impaired flexibility, impaired UE/LE use, postural dysfunction, and pain.  ACTIVITY  LIMITATIONS: bending, lifting, carry, locomotion, cleaning, community activity, driving, and or occupation  PERSONAL FACTORS:  Arthritis, DM II, HTN are also affecting patient's functional outcome.  REHAB POTENTIAL: Good  CLINICAL DECISION MAKING: Stable/uncomplicated  EVALUATION COMPLEXITY: Low    GOALS: Short term PT Goals Target date: 01/09/2024 Pt will be I and compliant with HEP. Baseline:  Goal status: New Pt will decrease pain by 25% overall Baseline: Goal status: New  Long term PT goals Target date: 02/20/2024 Pt will improve ROM to [facility] to improve functional mobility Baseline: Goal status: New Pt will improve  hip/knee strength to at least 4+/5 MMT to improve functional strength Baseline: Goal status: New Pt will improve OSWESTRY to at least 11% functional to show improved function Baseline: Goal status: New Pt will reduce pain by overall 50% overall with usual activity Baseline: Goal status: New Pt will be able to negotiate stairs with a reciprocal gait pattern  with use of 1x hand rail if needed. Baseline: Goal status: New Pt will be able to ambulate community distances at least 1000 ft WNL gait pattern without complaints Baseline: Goal status: New     7. Pt will improve her STS by 2.3 seconds for Minimal clinically important difference.         Baseline:          Goal status: NEW  PLAN: PT FREQUENCY: 1-2times per week   PT DURATION: 6-8 weeks  PLANNED INTERVENTIONS (unless contraindicated): aquatic PT, Canalith repositioning, cryotherapy, Electrical stimulation, Iontophoresis with 4 mg/ml dexamethasome, Moist heat, traction, Ultrasound, gait training, Therapeutic exercise, balance training, neuromuscular re-education, patient/family education, prosthetic training, manual techniques, passive ROM, dry needling, taping, vasopnuematic device, vestibular, spinal manipulations, joint manipulations  PLAN FOR NEXT SESSION: Assess HEP/update PRN, continue to  progress functional mobility, strengthen proximal hip muscles and core. Decrease patients pain.   For all possible CPT codes, reference the Planned Interventions line above.     Check all conditions that are expected to impact treatment: {Conditions expected to impact treatment:Morbid obesity, Musculoskeletal disorders, and Structural or anatomic abnormalities   If treatment provided at initial evaluation, no treatment charged due to lack of authorization.   Royal Hawthorn PT, DPT 01/07/24  12:10 PM

## 2024-01-12 ENCOUNTER — Ambulatory Visit (HOSPITAL_BASED_OUTPATIENT_CLINIC_OR_DEPARTMENT_OTHER)

## 2024-01-17 ENCOUNTER — Ambulatory Visit (HOSPITAL_BASED_OUTPATIENT_CLINIC_OR_DEPARTMENT_OTHER): Payer: Self-pay | Attending: Family Medicine | Admitting: Physical Therapy

## 2024-01-17 ENCOUNTER — Encounter (HOSPITAL_BASED_OUTPATIENT_CLINIC_OR_DEPARTMENT_OTHER): Payer: Self-pay | Admitting: Physical Therapy

## 2024-01-17 DIAGNOSIS — M6281 Muscle weakness (generalized): Secondary | ICD-10-CM | POA: Insufficient documentation

## 2024-01-17 DIAGNOSIS — R2689 Other abnormalities of gait and mobility: Secondary | ICD-10-CM | POA: Diagnosis not present

## 2024-01-17 DIAGNOSIS — M5459 Other low back pain: Secondary | ICD-10-CM | POA: Diagnosis not present

## 2024-01-17 NOTE — Therapy (Signed)
 OUTPATIENT PHYSICAL THERAPY THORACOLUMBAR TREATMENT   Patient Name: Jordan Ramirez MRN: [id number] DOB:Mar 13, 1961, 63 y.o., female Today's Date: 01/17/2024  END OF SESSION:  PT End of Session - 01/17/24 1614     Visit Number 4    Number of Visits 12    Date for PT Re-Evaluation 02/20/24    Authorization Type [place] MEDICAID PREPAID HEALTH PLAN    PT Start Time 1614    PT Stop Time 1654    PT Time Calculation (min) 40 min    Behavior During Therapy [facility] for tasks assessed/performed              Past Medical History:  Diagnosis Date   Anemia    Anxiety    Arthritis    Asthma    Chronic low back pain    Diabetes mellitus without complication (HCC)    Fibroid    Hypertension    Neuromuscular disorder (HCC) July 2019   Treatment by Orthopedic specialist   Obesity    Routine general medical examination at a health care facility 07/13/2023   Past Surgical History:  Procedure Laterality Date   PILONIDAL CYST EXCISION     WISDOM TOOTH EXTRACTION     Patient Active Problem List   Diagnosis Date Noted   Routine general medical examination at a health care facility 07/13/2023   Peripheral polyneuropathy 05/25/2022   Chronic bilateral low back pain 05/25/2022   Mixed incontinence urge and stress 02/13/2020   Bilateral lower extremity edema 02/13/2020   Ganglion cyst of dorsum of right wrist 01/16/2018   Bilateral knee pain 04/17/2012   Pain of left heel 04/17/2012   Diabetes mellitus type 2 with neurological manifestations (HCC) 08/28/2009   THUMB PAIN, LEFT 08/27/2009   Hyperlipemia 08/20/2009   Morbid obesity (HCC)-BMI 37.4. Co morbilities HTN,DM II,HLD,OA-urine incontinence. 08/20/2009   ANEMIA-NOS 08/20/2009   Essential hypertension 08/20/2009   Allergic rhinitis 08/20/2009   Asthma 08/20/2009   MENORRHAGIA, PERIMENOPAUSAL 08/20/2009   DEGENERATIVE JOINT DISEASE, KNEE 08/20/2009   TENDINITIS, LEFT THUMB 08/20/2009    PCP: [place], Betty G, MD   REFERRING  PROVIDER: [place], Betty G, MD   REFERRING DIAG:  Chronic right-sided low back pain, unspecified whether sciatica present [M54.50, G89.29]   Rationale for Evaluation and Treatment: Rehabilitation  THERAPY DIAG:  Other low back pain  Other abnormalities of gait and mobility  Muscle weakness (generalized)  ONSET DATE: 2019 started with intermittent flare up's since.   SUBJECTIVE:  SUBJECTIVE STATEMENT: Pt reports she doesn't know how to swim.   "I'm tired".   PERTINENT HISTORY:  Arthritis, DM II, HTN  PAIN:  Are you having pain? Yes: NPRS scale: 0/10, At rest  Pain location:   Pain description:  Aggravating factors: Laying down on R side, standing for prolonged periods of time when cooking.  Relieving factors: Stretching, sitting down.   PRECAUTIONS: None  RED FLAGS: None   WEIGHT BEARING RESTRICTIONS: No  FALLS:  Has patient fallen in last 6 months? No  LIVING ENVIRONMENT: Lives with: lives alone Lives in: House/apartment Stairs: Yes: Internal: 1 flight steps; on right going up and External: 2-3 steps; on right going up Has following equipment at home: None  OCCUPATION: Unemployed   PLOF: Independent  PATIENT GOALS: Pt would like to be able to move without continued pain.   NEXT MD VISIT: None scheduled to date.   OBJECTIVE:  Note: Objective measures were completed at Evaluation unless otherwise noted.  DIAGNOSTIC FINDINGS:  1. Progressive degenerative disc disease and facet hypertrophy in the lower lumbar spine. 2. Grade 1 anterolisthesis of L4 on L5, slightly increased from 2019. 3. Degenerative change of the sacroiliac joints, also progressed.  PATIENT SURVEYS:  Modified Oswestry 18/50 36%    COGNITION: Overall cognitive status: Within functional limits for tasks  assessed     SENSATION: WFL  POSTURE: rounded shoulders and decreased lumbar lordosis  PALPATION: None per pt.   LUMBAR ROM:  Pt has normal ROM within her lumbar spine. She reports a tightness with lateral flexion to the R.    LOWER EXTREMITY ROM:   ALL OTHER ROM WFL.   Active  Right eval Left eval  Hip flexion    Hip extension    Hip abduction    Hip adduction    Hip internal rotation    Hip external rotation    Knee flexion 87 seated 90 seated  Knee extension     (Blank rows = not tested)  LOWER EXTREMITY MMT:    MMT Right eval Left eval  Hip flexion 4 3- P!  Hip extension -8 lacking - 8 lacking   Hip abduction    Hip adduction    Knee flexion 4 4  Knee extension 4- 3+ P!   (Blank rows = not tested)   FUNCTIONAL TESTS:  5 times sit to stand: 16.97 sec Timed up and go (TUG): 11.39 sec  GAIT: Distance walked: 73ft  Assistive device utilized: None Level of assistance: Complete Independence Comments: Pt has a flexed posture and slight trunk lean to the R side. She lacks terminal knee extension.   [facility] Adult PT Treatment:                                                DATE: 01/17/24 Pt seen for aquatic therapy today.  Treatment took place in water 3.5-4.75 ft in depth at the [facility] pool. Temp of water was 91.  Pt entered/exited the pool via stairs independently with bilat rail.  - Intro to aquatic therapy principles - UE on barbell next to wall walking forward -> single arm on wall, opp arm on yellow hand float- walking forward/ backward  - side stepping with UE on barbell next to wall - back against wall:  bilat arm horiz abdct/ addct with yellow hand floats x 5; row motion  x 10 - UE on wall:  hip add/abd x5 ; hip extension to toe touch x5; relaxed squats x5 - seated in lift chair:  alternating LAQ with DF x 10, cycling, hip abdct/ addct x 10-  2 sets - return to walking forward and backward with L hand on wall  Pt requires the buoyancy and  hydrostatic pressure of water for support, and to offload joints by unweighting joint load by at least 50 % in navel deep water and by at least 75-80% in chest to neck deep water.  Viscosity of the water is needed for resistance of strengthening. Water current perturbations provides challenge to standing balance requiring increased core activation.     [facility] Adult PT Treatment:                                                Therapeutic Exercise: NuStep, L6, UE/LE x 6 min  Seated Hamstring stretch, 15 sec x 3 each leg, cues for posture Bridge, 5 sec hold 2x 10 Post pelvic press -> cues for performance -> switched to neutral spine and TrA set Supine TrA set with knee press x 5 sec x 10 Lower trunk rotation x 5 each direction LAQ x10, bilat  Seated clams with RTB x20 Seated TrA set with lap press x 5 sec (preferred) Supine hip IR.    DATE: 12/28/23  Therapeutic Exercise: NuStep, L6, UE/LE x 6 min  Seated Hamstring stretch, 15 sec x 3 each leg, cues for posture Bridge, 5 sec hold x 10 Post pelvic press -> cues for performance -> switched to neutral spine and TrA set Supine TrA set with lap press x 5 sec x 10 Lower trunk rotation x 5 each direction Single knee to chest stretch x 10s x 2 reps each LE Seated TrA set with lap press x 5 sec (preferred)  Therapeutic Activity: STS from mat table, cues for forward arm reach and hip hinge x 5                                                                                                                         PATIENT EDUCATION:  Education details: reviewed HEP; modified / updated HEP Person educated: Patient Education method: Explanation and Demonstration Education comprehension: verbalized understanding and returned demonstration  HOME EXERCISE PROGRAM: Access Code: [email or web address] URL: [email or web address]   ASSESSMENT:  CLINICAL IMPRESSION:  Pt demonstrates safety and independence in aquatic setting with therapist  instructing from deck. Pt requires UE support and close supervision (some fear in water reported/ doesn't know how to swim) Pt is directed through various movement patterns and trials in both sitting and standing and seated positions. No increase in pain. Lorece is a good candidate for aquatic therapy and will benefit from the properties of water to progress towards land based goals.Goals are ongoing.  Patient will  benefit from skilled PT to address below impairments, limitations and improve overall function.    From initial evaluation: Patient is a 63 y.o F referred to PT for chronic lower back pain with sciatica. Pt ambulates with a flexed trunk and slight R trunk lean. She has decreased stride length and lacks terminal knee extension. She demonstrates a weak core and quick onset of fatigue with mobility exercises today. Pt educated on importance of mobility and strengthening. Patient will benefit from skilled PT to address below impairments, limitations and improve overall function.  OBJECTIVE IMPAIRMENTS: decreased activity tolerance, difficulty walking, decreased balance, decreased endurance, decreased mobility, decreased ROM, decreased strength, impaired flexibility, impaired UE/LE use, postural dysfunction, and pain.  ACTIVITY LIMITATIONS: bending, lifting, carry, locomotion, cleaning, community activity, driving, and or occupation  PERSONAL FACTORS:  Arthritis, DM II, HTN are also affecting patient's functional outcome.  REHAB POTENTIAL: Good  CLINICAL DECISION MAKING: Stable/uncomplicated  EVALUATION COMPLEXITY: Low    GOALS: Short term PT Goals Target date: 01/09/2024 Pt will be I and compliant with HEP. Baseline:  Goal status: New Pt will decrease pain by 25% overall Baseline: Goal status: New  Long term PT goals Target date: 02/20/2024 Pt will improve ROM to [facility] to improve functional mobility Baseline: Goal status: New Pt will improve  hip/knee strength to at least 4+/5  MMT to improve functional strength Baseline: Goal status: New Pt will improve OSWESTRY to at least 11% functional to show improved function Baseline: Goal status: New Pt will reduce pain by overall 50% overall with usual activity Baseline: Goal status: New Pt will be able to negotiate stairs with a reciprocal gait pattern with use of 1x hand rail if needed. Baseline: Goal status: New Pt will be able to ambulate community distances at least 1000 ft WNL gait pattern without complaints Baseline: Goal status: New     7. Pt will improve her STS by 2.3 seconds for Minimal clinically important difference.         Baseline:          Goal status: NEW  PLAN: PT FREQUENCY: 1-2times per week   PT DURATION: 6-8 weeks  PLANNED INTERVENTIONS (unless contraindicated): aquatic PT, Canalith repositioning, cryotherapy, Electrical stimulation, Iontophoresis with 4 mg/ml dexamethasome, Moist heat, traction, Ultrasound, gait training, Therapeutic exercise, balance training, neuromuscular re-education, patient/family education, prosthetic training, manual techniques, passive ROM, dry needling, taping, vasopnuematic device, vestibular, spinal manipulations, joint manipulations  Mayer Camel, PTA 01/17/24 5:58 PM [facility] [address] [place], [place], [address] Phone: [phone]   Fax:  [phone]     PLAN FOR NEXT SESSION: Assess HEP/update PRN, continue to progress functional mobility, strengthen proximal hip muscles and core. Decrease patients pain.   For all possible CPT codes, reference the Planned Interventions line above.     Check all conditions that are expected to impact treatment: {Conditions expected to impact treatment:Morbid obesity, Musculoskeletal disorders, and Structural or anatomic abnormalities   If treatment provided at initial evaluation, no treatment charged due to lack of authorization.

## 2024-01-18 ENCOUNTER — Observation Stay (HOSPITAL_BASED_OUTPATIENT_CLINIC_OR_DEPARTMENT_OTHER)
Admission: EM | Admit: 2024-01-18 | Discharge: 2024-01-19 | Disposition: A | Discharge status: Home / Self Care | Attending: Internal Medicine | Admitting: Internal Medicine

## 2024-01-18 ENCOUNTER — Other Ambulatory Visit: Payer: Self-pay

## 2024-01-18 ENCOUNTER — Encounter (HOSPITAL_BASED_OUTPATIENT_CLINIC_OR_DEPARTMENT_OTHER): Payer: Self-pay

## 2024-01-18 ENCOUNTER — Emergency Department (HOSPITAL_BASED_OUTPATIENT_CLINIC_OR_DEPARTMENT_OTHER)

## 2024-01-18 DIAGNOSIS — E1169 Type 2 diabetes mellitus with other specified complication: Secondary | ICD-10-CM | POA: Diagnosis not present

## 2024-01-18 DIAGNOSIS — E669 Obesity, unspecified: Secondary | ICD-10-CM

## 2024-01-18 DIAGNOSIS — R079 Chest pain, unspecified: Principal | ICD-10-CM | POA: Diagnosis present

## 2024-01-18 DIAGNOSIS — R9431 Abnormal electrocardiogram [ECG] [EKG]: Secondary | ICD-10-CM | POA: Insufficient documentation

## 2024-01-18 DIAGNOSIS — Z79899 Other long term (current) drug therapy: Secondary | ICD-10-CM | POA: Diagnosis not present

## 2024-01-18 DIAGNOSIS — I10 Essential (primary) hypertension: Secondary | ICD-10-CM | POA: Diagnosis not present

## 2024-01-18 DIAGNOSIS — J45909 Unspecified asthma, uncomplicated: Secondary | ICD-10-CM | POA: Diagnosis not present

## 2024-01-18 DIAGNOSIS — K8020 Calculus of gallbladder without cholecystitis without obstruction: Secondary | ICD-10-CM | POA: Diagnosis not present

## 2024-01-18 DIAGNOSIS — R112 Nausea with vomiting, unspecified: Secondary | ICD-10-CM | POA: Diagnosis not present

## 2024-01-18 DIAGNOSIS — R7989 Other specified abnormal findings of blood chemistry: Secondary | ICD-10-CM | POA: Diagnosis not present

## 2024-01-18 DIAGNOSIS — E1149 Type 2 diabetes mellitus with other diabetic neurological complication: Secondary | ICD-10-CM | POA: Diagnosis not present

## 2024-01-18 DIAGNOSIS — K8010 Calculus of gallbladder with chronic cholecystitis without obstruction: Secondary | ICD-10-CM

## 2024-01-18 DIAGNOSIS — R7401 Elevation of levels of liver transaminase levels: Secondary | ICD-10-CM | POA: Diagnosis not present

## 2024-01-18 DIAGNOSIS — Z7984 Long term (current) use of oral hypoglycemic drugs: Secondary | ICD-10-CM | POA: Diagnosis not present

## 2024-01-18 DIAGNOSIS — R110 Nausea: Secondary | ICD-10-CM | POA: Diagnosis not present

## 2024-01-18 DIAGNOSIS — R0789 Other chest pain: Principal | ICD-10-CM | POA: Diagnosis present

## 2024-01-18 DIAGNOSIS — D259 Leiomyoma of uterus, unspecified: Secondary | ICD-10-CM | POA: Diagnosis not present

## 2024-01-18 LAB — TSH: TSH: 0.881 u[IU]/mL (ref 0.350–4.500)

## 2024-01-18 LAB — BASIC METABOLIC PANEL WITH GFR
Anion gap: 9 (ref 5–15)
BUN: 17 mg/dL (ref 8–23)
CO2: 25 mmol/L (ref 22–32)
Calcium: 9 mg/dL (ref 8.9–10.3)
Chloride: 105 mmol/L (ref 98–111)
Creatinine, Ser: 1.09 mg/dL — ABNORMAL HIGH (ref 0.44–1.00)
GFR, Estimated: 57 mL/min — ABNORMAL LOW (ref >60)
Glucose, Bld: 181 mg/dL — ABNORMAL HIGH (ref 70–99)
Potassium: 3.5 mmol/L (ref 3.5–5.1)
Sodium: 139 mmol/L (ref 135–145)

## 2024-01-18 LAB — HEPATITIS PANEL, ACUTE
HCV Ab: NONREACTIVE
Hep A IgM: NONREACTIVE
Hep B C IgM: NONREACTIVE
Hepatitis B Surface Ag: NONREACTIVE

## 2024-01-18 LAB — CBC
HCT: 37.7 % (ref 36.0–46.0)
Hemoglobin: 12.8 g/dL (ref 12.0–15.0)
MCH: 30.3 pg (ref 26.0–34.0)
MCHC: 34 g/dL (ref 30.0–36.0)
MCV: 89.3 fL (ref 80.0–100.0)
Platelets: 190 10*3/uL (ref 150–400)
RBC: 4.22 MIL/uL (ref 3.87–5.11)
RDW: 13.3 % (ref 11.5–15.5)
WBC: 9.4 10*3/uL (ref 4.0–10.5)
nRBC: 0 % (ref 0.0–0.2)

## 2024-01-18 LAB — GAMMA GT: GGT: 195 U/L — ABNORMAL HIGH (ref 7–50)

## 2024-01-18 LAB — PROTIME-INR
INR: 0.9 (ref 0.8–1.2)
Prothrombin Time: 12.6 s (ref 11.4–15.2)

## 2024-01-18 LAB — D-DIMER, QUANTITATIVE: D-Dimer, Quant: 0.52 ug{FEU}/mL — ABNORMAL HIGH (ref 0.00–0.50)

## 2024-01-18 LAB — GLUCOSE, CAPILLARY
Glucose-Capillary: 79 mg/dL (ref 70–99)
Glucose-Capillary: 76 mg/dL (ref 70–99)
Glucose-Capillary: 148 mg/dL — ABNORMAL HIGH (ref 70–99)

## 2024-01-18 LAB — TROPONIN I (HIGH SENSITIVITY)
Troponin I (High Sensitivity): 19 ng/L — ABNORMAL HIGH (ref <18)
Troponin I (High Sensitivity): 19 ng/L — ABNORMAL HIGH (ref <18)

## 2024-01-18 LAB — HEPATIC FUNCTION PANEL
ALT: 134 U/L — ABNORMAL HIGH (ref 0–44)
AST: 317 U/L — ABNORMAL HIGH (ref 15–41)
Albumin: 3.7 g/dL (ref 3.5–5.0)
Alkaline Phosphatase: 89 U/L (ref 38–126)
Bilirubin, Direct: 0.4 mg/dL — ABNORMAL HIGH (ref 0.0–0.2)
Indirect Bilirubin: 0.5 mg/dL (ref 0.3–0.9)
Total Bilirubin: 0.9 mg/dL (ref 0.0–1.2)
Total Protein: 7.1 g/dL (ref 6.5–8.1)

## 2024-01-18 LAB — HEMOGLOBIN A1C
Hgb A1c MFr Bld: 6.3 % — ABNORMAL HIGH (ref 4.8–5.6)
Mean Plasma Glucose: 134.11 mg/dL

## 2024-01-18 LAB — ACETAMINOPHEN LEVEL: Acetaminophen (Tylenol), Serum: <10 ug/mL — ABNORMAL LOW (ref 10–30)

## 2024-01-18 LAB — HIV ANTIBODY (ROUTINE TESTING W REFLEX): HIV Screen 4th Generation wRfx: NONREACTIVE

## 2024-01-18 LAB — LIPASE, BLOOD: Lipase: 39 U/L (ref 11–51)

## 2024-01-18 MED ORDER — FLUTICASONE PROPIONATE 50 MCG/ACT NA SUSP
1.0000 | Freq: Two times a day (BID) | NASAL | Status: DC
  Administered 2024-01-18: 1 via NASAL
  Filled 2024-01-18: qty 16

## 2024-01-18 MED ORDER — SODIUM CHLORIDE 0.9 % IV BOLUS
1000.0000 mL | Freq: Once | INTRAVENOUS | Status: AC
  Administered 2024-01-18: 1000 mL via INTRAVENOUS

## 2024-01-18 MED ORDER — SODIUM CHLORIDE 0.9% FLUSH
3.0000 mL | Freq: Two times a day (BID) | INTRAVENOUS | Status: DC
  Administered 2024-01-19: 3 mL via INTRAVENOUS

## 2024-01-18 MED ORDER — ALUM & MAG HYDROXIDE-SIMETH 200-200-20 MG/5ML PO SUSP
30.0000 mL | Freq: Once | ORAL | Status: AC
Start: 2024-01-18 — End: 2024-01-18
  Administered 2024-01-18: 30 mL via ORAL
  Filled 2024-01-18: qty 30

## 2024-01-18 MED ORDER — ACETAMINOPHEN 325 MG PO TABS: 650.0000 mg | ORAL_TABLET | Freq: Four times a day (QID) | ORAL | Status: DC | PRN

## 2024-01-18 MED ORDER — ASPIRIN 81 MG PO CHEW
324.0000 mg | CHEWABLE_TABLET | Freq: Once | ORAL | Status: AC
  Administered 2024-01-18: 324 mg via ORAL
  Filled 2024-01-18: qty 4

## 2024-01-18 MED ORDER — AMLODIPINE BESYLATE 10 MG PO TABS
10.0000 mg | ORAL_TABLET | Freq: Every day | ORAL | Status: DC
  Administered 2024-01-18 – 2024-01-19 (×2): 10 mg via ORAL
  Filled 2024-01-18 (×2): qty 1

## 2024-01-18 MED ORDER — LACTATED RINGERS IV SOLN: Freq: Once | INTRAVENOUS | Status: AC

## 2024-01-18 MED ORDER — ACETAMINOPHEN 650 MG RE SUPP: 650.0000 mg | Freq: Four times a day (QID) | RECTAL | Status: DC | PRN

## 2024-01-18 MED ORDER — ROSUVASTATIN CALCIUM 5 MG PO TABS
10.0000 mg | ORAL_TABLET | Freq: Every day | ORAL | Status: DC
  Administered 2024-01-18: 10 mg via ORAL
  Filled 2024-01-18: qty 2

## 2024-01-18 MED ORDER — PANTOPRAZOLE SODIUM 40 MG IV SOLR
40.0000 mg | Freq: Two times a day (BID) | INTRAVENOUS | Status: DC
  Administered 2024-01-18 – 2024-01-19 (×2): 40 mg via INTRAVENOUS
  Filled 2024-01-18 (×2): qty 10

## 2024-01-18 MED ORDER — IOHEXOL 350 MG/ML SOLN
125.0000 mL | Freq: Once | INTRAVENOUS | Status: AC | PRN
  Administered 2024-01-18: 125 mL via INTRAVENOUS

## 2024-01-18 MED ORDER — SODIUM CHLORIDE 0.9% FLUSH: 3.0000 mL | INTRAVENOUS | Status: DC | PRN

## 2024-01-18 MED ORDER — LIDOCAINE VISCOUS HCL 2 % MT SOLN
15.0000 mL | Freq: Once | OROMUCOSAL | Status: AC
  Administered 2024-01-18: 15 mL via OROMUCOSAL
  Filled 2024-01-18: qty 15

## 2024-01-18 MED ORDER — NITROGLYCERIN 0.4 MG SL SUBL: 0.4000 mg | SUBLINGUAL_TABLET | SUBLINGUAL | Status: DC | PRN

## 2024-01-18 MED ORDER — ONDANSETRON HCL 4 MG/2ML IJ SOLN
4.0000 mg | Freq: Once | INTRAMUSCULAR | Status: DC
Start: 2024-01-18 — End: 2024-01-19
  Filled 2024-01-18: qty 2

## 2024-01-18 MED ORDER — INSULIN ASPART 100 UNIT/ML IJ SOLN: 0.0000 [IU] | Freq: Three times a day (TID) | INTRAMUSCULAR | Status: DC

## 2024-01-18 MED ORDER — HYDROCODONE-ACETAMINOPHEN 5-325 MG PO TABS: 1.0000 | ORAL_TABLET | ORAL | Status: DC | PRN

## 2024-01-18 MED ORDER — HYDRALAZINE HCL 20 MG/ML IJ SOLN: 10.0000 mg | Freq: Three times a day (TID) | INTRAMUSCULAR | Status: DC | PRN

## 2024-01-18 MED ORDER — MORPHINE SULFATE (PF) 2 MG/ML IV SOLN: 2.0000 mg | INTRAVENOUS | Status: DC | PRN

## 2024-01-18 MED ORDER — SODIUM CHLORIDE 0.9 % IV SOLN: 250.0000 mL | INTRAVENOUS | Status: DC | PRN

## 2024-01-18 MED ORDER — HEPARIN SODIUM (PORCINE) 5000 UNIT/ML IJ SOLN
5000.0000 [IU] | Freq: Two times a day (BID) | INTRAMUSCULAR | Status: DC
  Administered 2024-01-18 – 2024-01-19 (×2): 5000 [IU] via SUBCUTANEOUS
  Filled 2024-01-18 (×2): qty 1

## 2024-01-18 NOTE — Assessment & Plan Note (Signed)
    Latest Ref Rng & Units 01/18/2024    1:09 AM 07/14/2023    1:27 PM 04/10/2022    8:05 AM  Hepatic Function  Total Protein 6.5 - 8.1 g/dL 7.1  7.3  7.8   Albumin 3.5 - 5.0 g/dL 3.7  4.2  4.2   AST 15 - 41 U/L 317  16  23   ALT 0 - 44 U/L 134  12  24   Alk Phosphatase 38 - 126 U/L 89  74  62   Total Bilirubin 0.0 - 1.2 mg/dL 0.9  0.8  0.7   Bilirubin, Direct 0.0 - 0.2 mg/dL 0.4       2/2 to fatty liver or hepatic congestion and wait for ggt to decide on MRCP. Follow and GI consult per am team. Hold rosuvastatin.

## 2024-01-18 NOTE — Assessment & Plan Note (Signed)
 Will continue patient on amlodipine and hold ARB therapy are due to mild AKI. As needed hydralazine.

## 2024-01-18 NOTE — ED Provider Notes (Signed)
 [facility] Provider Note   CSN: [id number] Arrival date & time: 01/18/24  0051     History  Chief Complaint  Patient presents with   Chest Pain    Jordan Ramirez is a 63 y.o. female.  Patient with hypertension, diabetes, high cholesterol here with chest "pressure" and nausea and vomiting.  States she went to bed feeling well but woke up with nausea about 2 to 3 hours ago with constant chest pressure.  Pressure somewhat improved after vomiting once.  She still has nausea and central chest pressure that radiates through to her back.  Symptoms started after eating Mediterranean food.  No shortness of breath, cough, fever, runny nose or sore throat.  No abdominal pain.  Nothing makes the pain better or worse.  Pain radiates straight through to her back it is associate with nausea.  No diaphoresis.  No abdominal pain.  Denies any cardiac history.  She is never had a stress test.  Still has appendix and gallbladder.  No history of acid reflux or ulcers.  The history is provided by the patient and the spouse.  Chest Pain Associated symptoms: back pain, nausea and vomiting   Associated symptoms: no abdominal pain, no dizziness, no fever, no headache, no shortness of breath and no weakness        Home Medications Prior to Admission medications   Medication Sig Start Date End Date Taking? Authorizing Provider  amLODipine-olmesartan (AZOR) 10-40 MG tablet Take 1 tablet by mouth daily. 12/28/23   [place], Betty G, MD  Blood Glucose Monitoring Suppl (ONETOUCH VERIO) w/Device KIT Use to test blood sugars once daily. 08/13/20   [place], Betty G, MD  Cholecalciferol (VITAMIN D3 PO) Take 1 tablet by mouth daily.    [provider]  fluticasone (FLONASE) 50 MCG/ACT nasal spray Place 1 spray into both nostrils 2 (two) times daily. 12/28/23   [place], Betty G, MD  glucose blood (ACCU-CHEK GUIDE TEST) test strip Use to test blood sugars 1-2 times daily.  01/06/24   [place], Betty G, MD  Lancets [facility] ULTRASOFT) lancets Use to test blood sugars once daily. 08/13/20   [place], Betty G, MD  metFORMIN (GLUCOPHAGE-XR) 500 MG 24 hr tablet Take 1500 mg by mouth daily (500 mg with breakfast and 1000 mg with supper) 12/12/23   [place], Betty G, MD  Multiple Vitamins-Minerals (ZINC PO) Take 1 tablet by mouth daily.    [provider]  rosuvastatin (CRESTOR) 10 MG tablet Take 1 tablet (10 mg total) by mouth daily. 07/14/23   [place], Betty G, MD  tiZANidine (ZANAFLEX) 4 MG tablet Take 0.5-1 tablets (2-4 mg total) by mouth at bedtime as needed for muscle spasms. 11/16/23   [place], Betty G, MD      Allergies    Patient has no known allergies.    Review of Systems   Review of Systems  Constitutional:  Negative for activity change, appetite change and fever.  HENT:  Negative for congestion and rhinorrhea.   Respiratory:  Positive for chest tightness. Negative for choking and shortness of breath.   Cardiovascular:  Positive for chest pain.  Gastrointestinal:  Positive for nausea and vomiting. Negative for abdominal pain.  Genitourinary:  Negative for dysuria, enuresis and hematuria.  Musculoskeletal:  Positive for back pain. Negative for arthralgias and myalgias.  Skin:  Negative for rash.  Neurological:  Negative for dizziness, weakness and headaches.   all other systems are negative except as noted in the  HPI and PMH.    Physical Exam Updated Vital Signs BP (!) 155/80 (BP Location: Right Arm)   Pulse 79   Temp 98.1 F (36.7 C)   Resp 18   Ht 5\' 7"  (1.702 m)   Wt 106.6 kg   LMP 09/29/2017   SpO2 98%   BMI 36.81 kg/m  Physical Exam Vitals and nursing note reviewed.  Constitutional:      General: She is not in acute distress.    Appearance: She is well-developed.  HENT:     Head: Normocephalic and atraumatic.     Mouth/Throat:     Pharynx: No oropharyngeal exudate.  Eyes:     Conjunctiva/sclera: Conjunctivae normal.     Pupils:  Pupils are equal, round, and reactive to light.  Neck:     Comments: No meningismus. Cardiovascular:     Rate and Rhythm: Normal rate and regular rhythm.     Heart sounds: Normal heart sounds. No murmur heard. Pulmonary:     Effort: Pulmonary effort is normal. No respiratory distress.     Breath sounds: Normal breath sounds.  Chest:     Chest wall: No tenderness.  Abdominal:     Palpations: Abdomen is soft.     Tenderness: There is no abdominal tenderness. There is no guarding or rebound.     Comments: Mild epigastric tenderness. No guarding or rebound  Musculoskeletal:        General: No tenderness. Normal range of motion.     Cervical back: Normal range of motion and neck supple.  Skin:    General: Skin is warm.  Neurological:     Mental Status: She is alert and oriented to person, place, and time.     Cranial Nerves: No cranial nerve deficit.     Motor: No abnormal muscle tone.     Coordination: Coordination normal.     Comments:  5/5 strength throughout. CN 2-12 intact.Equal grip strength.   Psychiatric:        Behavior: Behavior normal.     ED Results / Procedures / Treatments   Labs (all labs ordered are listed, but only abnormal results are displayed) Labs Reviewed  BASIC METABOLIC PANEL WITH GFR - Abnormal; Notable for the following components:      Result Value   Glucose, Bld 181 (*)    Creatinine, Ser 1.09 (*)    GFR, Estimated 57 (*)    All other components within normal limits  D-DIMER, QUANTITATIVE - Abnormal; Notable for the following components:   D-Dimer, Quant 0.52 (*)    All other components within normal limits  HEPATIC FUNCTION PANEL - Abnormal; Notable for the following components:   AST 317 (*)    ALT 134 (*)    Bilirubin, Direct 0.4 (*)    All other components within normal limits  ACETAMINOPHEN LEVEL - Abnormal; Notable for the following components:   Acetaminophen (Tylenol), Serum <10 (*)    All other components within normal limits   TROPONIN I (HIGH SENSITIVITY) - Abnormal; Notable for the following components:   Troponin I (High Sensitivity) 19 (*)    All other components within normal limits  TROPONIN I (HIGH SENSITIVITY) - Abnormal; Notable for the following components:   Troponin I (High Sensitivity) 19 (*)    All other components within normal limits  CBC  PROTIME-INR  LIPASE, BLOOD  HEPATITIS PANEL, ACUTE    EKG EKG Interpretation Date/Time:  Wednesday January 18 2024 00:59:29 EDT Ventricular Rate:  82 PR Interval:  168 QRS Duration:  92 QT Interval:  402 QTC Calculation: 469 R Axis:   190  Text Interpretation: Normal sinus rhythm Inferior infarct , age undetermined Anterolateral infarct , age undetermined Abnormal ECG No previous ECGs available No previous ECGs available Confirmed by Glynn Octave [phone]) on 01/18/2024 1:04:22 AM  Radiology CT Angio Chest/Abd/Pel for Dissection W and/or Wo Contrast Result Date: 01/18/2024 CLINICAL DATA:  Acute aortic syndrome, chest pain, nausea EXAM: CT ANGIOGRAPHY CHEST, ABDOMEN AND PELVIS TECHNIQUE: Non-contrast CT of the chest was initially obtained. Multidetector CT imaging through the chest, abdomen and pelvis was performed using the standard protocol during bolus administration of intravenous contrast. Multiplanar reconstructed images and MIPs were obtained and reviewed to evaluate the vascular anatomy. RADIATION DOSE REDUCTION: This exam was performed according to the departmental dose-optimization program which includes automated exposure control, adjustment of the mA and/or kV according to patient size and/or use of iterative reconstruction technique. CONTRAST:  OMNIPAQUE IOHEXOL 350 MG/ML SOLN COMPARISON:  CT abdomen pelvis 04/10/2022 FINDINGS: CTA CHEST FINDINGS Cardiovascular: Satisfactory opacification of the pulmonary arteries to the segmental level. No evidence of pulmonary embolism. Normal heart size. No pericardial effusion. Mediastinum/Nodes: No enlarged  mediastinal, hilar, or axillary lymph nodes. Thyroid gland, trachea, and esophagus demonstrate no significant findings. Lungs/Pleura: Stable ground-glass opacity within the medial basal right lower lobe adjacent to the right inferior pulmonary vein with mild retraction of the adjacent fissure most in keeping with parenchymal scarring. The lungs are otherwise clear. No pneumothorax or pleural effusion. No central obstructing lesion. Musculoskeletal: No chest wall abnormality. No acute or significant osseous findings. Review of the MIP images confirms the above findings. CTA ABDOMEN AND PELVIS FINDINGS VASCULAR Aorta: Normal caliber aorta without aneurysm, dissection, vasculitis or significant stenosis. Celiac: Patent without evidence of aneurysm, dissection, vasculitis or significant stenosis. SMA: Patent without evidence of aneurysm, dissection, vasculitis or significant stenosis. Renals: Both renal arteries are patent without evidence of aneurysm, dissection, vasculitis, fibromuscular dysplasia or significant stenosis. IMA: Patent without evidence of aneurysm, dissection, vasculitis or significant stenosis. Inflow: Patent without evidence of aneurysm, dissection, vasculitis or significant stenosis. Veins: No obvious venous abnormality within the limitations of this arterial phase study. Review of the MIP images confirms the above findings. NON-VASCULAR Hepatobiliary: No focal liver abnormality is seen. No gallstones, gallbladder wall thickening, or biliary dilatation. Pancreas: Unremarkable Spleen: Unremarkable Adrenals/Urinary Tract: Adrenal glands are unremarkable. Kidneys are normal, without renal calculi, focal lesion, or hydronephrosis. Bladder is unremarkable. Stomach/Bowel: Stomach is within normal limits. Appendix appears normal. No evidence of bowel wall thickening, distention, or inflammatory changes. Lymphatic: No pathologic adenopathy within the abdomen pelvis. Reproductive: Lobulated, mildly enlarged  appearance of the uterus likely relates to underlying uterine fibroids, unchanged from prior examination. The pelvic organs are otherwise unremarkable. Other: No abdominal wall hernia or abnormality. No abdominopelvic ascites. Musculoskeletal: Degenerative changes are seen within the lumbosacral junction. No acute bone abnormality. No lytic or blastic bone lesion. Review of the MIP images confirms the above findings. IMPRESSION: 1. No evidence of aortic dissection or aneurysm. No acute intrathoracic or intra-abdominal pathology identified. 2. Fibroid uterus. Electronically Signed   By: Helyn Numbers M.D.   On: 01/18/2024 02:37   DG Chest 2 View Result Date: 01/18/2024 CLINICAL DATA:  Chest pain since 2 hours ago.  Nausea. EXAM: CHEST - 2 VIEW COMPARISON:  05/25/2022 FINDINGS: Stable cardiomediastinal silhouette. No focal consolidation, pleural effusion, or pneumothorax. No displaced rib fractures. IMPRESSION: No active cardiopulmonary disease. Electronically Signed   By: Joselyn Glassman  Stutzman M.D.   On: 01/18/2024 01:22    Procedures Procedures    Medications Ordered in ED Medications  alum & mag hydroxide-simeth (MAALOX/MYLANTA) 200-200-20 MG/5ML suspension 30 mL (has no administration in time range)  lidocaine (XYLOCAINE) 2 % viscous mouth solution 15 mL (has no administration in time range)    ED Course/ Medical Decision Making/ A&P                                 Medical Decision Making Amount and/or Complexity of Data Reviewed Labs: ordered. Decision-making details documented in ED Course. Radiology: ordered and independent interpretation performed. Decision-making details documented in ED Course. ECG/medicine tests: ordered and independent interpretation performed. Decision-making details documented in ED Course.  Risk OTC drugs. Prescription drug management. Decision regarding hospitalization.   Several hours of central chest pressure with nausea and vomiting.  Sinus rhythm on EKG  without acute ST changes. T wave inversions inferiorly, no comparison  ASA, zofran given.  Abdomen soft without peritoneal signs.  No substantial abdominal tenderness. Still has gallbladder and appendix.  Troponin minimally elevated at 19.  Transaminitis noted.  Lipase is normal.  No leukocytosis.  AST and ALT are elevated but bilirubin is normal.  Lipase is normal.  No significant leukocytosis.  CT scan is negative for acute aortic pathology.  Also shows no obvious gallbladder pathology or liver abnormalities.  Ultrasound not available.  Chest pressure and nausea have improved.  Will trend troponin.  Troponin stable at 19.  No further chest pressure.  Still with significant nausea and abdominal discomfort however.  Will give additional nausea medications and IVF.  Patient agreeable to observation admission for further evaluation of her chest pressure and vomiting.  Transaminitis likely secondary to viral infection but may benefit from right upper quadrant ultrasound.  Admission discussed with Dr. Janalyn Shy.  She requests discussion with cardiology given elevated troponin and T wave inversions on EKG  Discussed with Dr. Lily Peer cardiology.  Repeat EKG is unchanged.  Patient's chest pressure has improved.  Does not recommend heparin unless enzymes essentially elevate.  Agrees with echocardiogram in the morning and trending troponin.  Patient agreeable to observation admission.  Still having some nausea and abdominal discomfort but her chest pressure has improved. Troponin Remains flat.       Final Clinical Impression(s) / ED Diagnoses Final diagnoses:  Nonspecific chest pain  Nausea and vomiting, unspecified vomiting type  Transaminitis    Rx / DC Orders ED Discharge Orders     None         Abbygayle Helfand, Jeannett Senior, MD 01/18/24 [phone]

## 2024-01-18 NOTE — Plan of Care (Addendum)
 Plan of Care Note for accepted transfer  Patient: Jordan Ramirez              ZOX:[id number]  DOA: 01/18/2024     Facility requesting transfer: [facility] Requesting Provider: Dr. Manus Gunning  Reason for transfer: Transaminitis, chest pressure, elevated troponin demand ischemia versus NSEMT and AKI.  ED triage note:  Pt states CP started 2 hrs ago +nausea No radiation Denies [facility]      Facility course: 63 year old female history of essential hypertension, hyperlipidemia, peripheral neuropathy DM type II presented emergency department complaining of chest pressure, nausea and vomiting. Patient reporting that she has been feeling bad but woke up with nausea 2 to 3 hours ago with constant chest pressure.Pressure somewhat improved after vomiting once. She still has nausea and central chest pressure that radiates through to her back. Symptoms started after eating Mediterranean food. No shortness of breath, cough, fever, runny nose or sore throat. No abdominal pain. Nothing makes the pain better or worse. Pain radiates straight through to her back it is associate with nausea. No diaphoresis. No abdominal pain. Denies any cardiac history. She is never had a stress test. Still has appendix and gallbladder. No history of acid reflux or ulcers.    Hemodynamically stable at presentation to ED. Elevated troponin however flat.  Initial troponin 19 and second troponin 19 as well. EKG showing normal sinus rhythm heart rate 82, ST elevation in lead to ll and ll and ST depression in aVR. Elevated D-dimer 0.52. Normal lipase level. Normal pro time INR. CBC unremarkable. BMP showing evidence of AKI and hepatic function showing transaminitis elevated AST 317, ALT 134.  CTA chest no evidence of PE.  No acute intrathoracic and intra-abdominal abnormality. Chest x-ray no active disease process.  Hospitalist has been consulted for further evaluation management of transaminitis, chest pressure, elevated  troponin demand ischemia versus NSEMT and AKI.  Requested Dr. Manus Gunning to reach consult cardiology given patient has EKG changes with mild elevation of troponin.  Update, Repeat EKG is unchanged.  Dr. Manus Gunning discussed case with on-call cardiology Dr. Brayton Layman. Agrees with echo and trending troponin. no heparin unless substantially increases.  Need to inform cardiology upon arrival to the hospital for formal consult.  Plan of care: The patient is accepted for admission for inpatient status to.-Telemetry unit, at [facility].  [facility] will assume care on arrival to accepting facility. Until arrival, care as per EDP. However, [facility] available 24/7 for questions and assistance.   Check [email or web address] for on-call coverage.   Nursing staff, please call [facility] Admits & Consults System-Wide number under Amion on patient's arrival so appropriate admitting provider can evaluate the pt.    Author: Tereasa Coop, MD  01/18/2024  [facility]

## 2024-01-18 NOTE — Assessment & Plan Note (Signed)
 Glycemic protocol.  With Accu-Cheks every 4 hours.A1c Carb consistent diet .

## 2024-01-18 NOTE — Consult Note (Addendum)
 Cardiology Consultation   Patient ID: Jordan Ramirez MRN: [id number]; DOB: October 18, 1961  Admit date: 01/18/2024 Date of Consult: 01/18/2024  PCP:  [place], Betty G, MD   [facility] Providers Cardiologist:  None   {   Patient Profile:   Jordan Ramirez is a 63 y.o. female with a hx of HTN, HLD, peripheral neuropathy, DM type II who is being seen 01/18/2024 for the evaluation of chest pressure with EKG abnormalities with flat troponins at the request of [facility].  History of Present Illness:  [name] presented to [facility] ED overnight for chest pressure, nausea, and vomiting for 2-3 hours. She stated that she began to feel relief in the chest pressure after she vomited and the chest pressure resolved early this morning around 9am. At the moment, she denied chest pain, pressure, or shortness of breath. She currently denies any complaints or symptoms at this time. She has not had dyspnea or angina with exertion or at rest. She reports a remote smoking history about 50 years ago.    Past Medical History:  Diagnosis Date   Anemia    Anxiety    Arthritis    Asthma    Chronic low back pain    Diabetes mellitus without complication (HCC)    Fibroid    Hypertension    Neuromuscular disorder [facility]) July 2019   Treatment by Orthopedic specialist   Obesity    Routine general medical examination at a health care facility 07/13/2023    Past Surgical History:  Procedure Laterality Date   PILONIDAL CYST EXCISION     WISDOM TOOTH EXTRACTION       Home Medications:  Prior to Admission medications   Medication Sig Start Date End Date Taking? Authorizing Provider  amLODipine-olmesartan (AZOR) 10-40 MG tablet Take 1 tablet by mouth daily. 12/28/23  Yes [place], Betty G, MD  Blood Glucose Monitoring Suppl (ONETOUCH VERIO) w/Device KIT Use to test blood sugars once daily. 08/13/20  Yes [place], Betty G, MD  Cholecalciferol (VITAMIN D3 PO) Take 1 tablet by mouth daily.   Yes [provider]  fluticasone (FLONASE) 50 MCG/ACT nasal spray Place 1 spray into both nostrils 2 (two) times daily. 12/28/23  Yes [place], Betty G, MD  glucose blood (ACCU-CHEK GUIDE TEST) test strip Use to test blood sugars 1-2 times daily. 01/06/24  Yes [place], Betty G, MD  Lancets [facility] ULTRASOFT) lancets Use to test blood sugars once daily. 08/13/20  Yes [place], Betty G, MD  metFORMIN (GLUCOPHAGE-XR) 500 MG 24 hr tablet Take 1500 mg by mouth daily (500 mg with breakfast and 1000 mg with supper) 12/12/23  Yes [place], Betty G, MD  Multiple Vitamins-Minerals (ZINC PO) Take 1 tablet by mouth daily.   Yes [provider]  rosuvastatin (CRESTOR) 10 MG tablet Take 1 tablet (10 mg total) by mouth daily. 07/14/23  Yes [place], Betty G, MD  tiZANidine (ZANAFLEX) 4 MG tablet Take 0.5-1 tablets (2-4 mg total) by mouth at bedtime as needed for muscle spasms. 11/16/23  Yes [place], Betty G, MD    Inpatient Medications: Scheduled Meds:  ondansetron  4 mg Intravenous Once   Continuous Infusions:  PRN Meds:   Allergies:   No Known Allergies  Social History:   Social History   Socioeconomic History   Marital status: Single    Spouse name: Not on file   Number of children: 0   Years of education: Not on file   Highest education level: Master's degree (e.g., MA,  MS, MEng, MEd, MSW, MBA)  Occupational History   Occupation: Unemployed    Comment: 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, academic  Tobacco Use   Smoking status: Never   Smokeless tobacco: Never  Vaping Use   Vaping status: Never Used  Substance and Sexual Activity   Alcohol use: Not Currently   Drug use: No   Sexual activity: Not Currently    Birth control/protection: Post-menopausal, None  Other Topics Concern   Not on file  Social History Narrative   Not on file   Social Drivers of Health   Financial Resource Strain: High Risk (09/30/2023)   Overall Financial Resource Strain (CARDIA)     Difficulty of Paying Living Expenses: Very hard  Food Insecurity: Food Insecurity Present (09/30/2023)   Hunger Vital Sign    Worried About Running Out of Food in the Last Year: Often true    Ran Out of Food in the Last Year: Sometimes true  Transportation Needs: Unmet Transportation Needs (09/30/2023)   PRAPARE - Administrator, Civil Service (Medical): Yes    Lack of Transportation (Non-Medical): Yes  Physical Activity: Insufficiently Active (09/30/2023)   Exercise Vital Sign    Days of Exercise per Week: 1 day    Minutes of Exercise per Session: 20 min  Stress: No Stress Concern Present (09/30/2023)   [employer] of Occupational Health - Occupational Stress Questionnaire    Feeling of Stress : Only a little  Social Connections: Moderately Integrated (09/30/2023)   Social Connection and Isolation Panel [NHANES]    Frequency of Communication with Friends and Family: More than three times a week    Frequency of Social Gatherings with Friends and Family: Once a week    Attends Religious Services: More than 4 times per year    Active Member of [employer] or Organizations: Yes    Attends Engineer, structural: More than 4 times per year    Marital Status: Never married  Catering manager Violence: Not on file    Family History:   Family History  Problem Relation Age of Onset   Coronary artery disease Mother    Diabetes type II Mother    Depression Mother    Hypertension Mother    Arthritis Mother    Diabetes Mother    Hearing loss Mother    Heart disease Mother    Kidney disease Mother    Obesity Mother    Stroke Mother    Kidney failure Father    Arthritis Father    Hearing loss Father    Heart disease Father    Hypertension Father    Kidney disease Father    Obesity Father    Stroke Father    Hypertension Brother    Cancer Maternal Grandmother    Arthritis Maternal Grandmother    Diabetes Maternal Grandmother    Hearing loss Maternal Grandmother     Heart disease Maternal Grandmother    Kidney disease Maternal Grandmother    Obesity Maternal Grandmother    Cancer Paternal Grandfather      ROS:  Please see the history of present illness.  All other ROS reviewed and negative.     Physical Exam/Data:   Vitals:   01/18/24 1133 01/18/24 1134 01/18/24 1300 01/18/24 1421  BP: (!) 171/88  (!) 152/83 (!) 169/99  Pulse:  84 83 74  Resp: (!) 23 (!) 21 20 20   Temp: 98.1 F (36.7 C)   97.9 F (36.6 C)  TempSrc:    Oral  SpO2:  98% 98%   Weight:    106.6 kg  Height:    5\' 7"  (1.702 m)    Intake/Output Summary (Last 24 hours) at 01/18/2024 1445 Last data filed at 01/18/2024 1012 Gross per 24 hour  Intake 1000.15 ml  Output --  Net 1000.15 ml      01/18/2024    2:21 PM 01/18/2024   12:56 AM 12/28/2023    8:26 AM  Last 3 Weights  Weight (lbs) 235 lb 235 lb 232 lb  Weight (kg) 106.595 kg 106.595 kg 105.235 kg     Body mass index is 36.81 kg/m.  General: Well appearing, NAD, sitting in bed, obese HEENT: normal Vascular: Distal pulses 2+ bilaterally in the radial and DP arteries  Cardiac:  normal S1, S2; RRR; no murmur  Lungs:  clear to auscultation bilaterally, no wheezing, rhonchi or rales  Abd: soft, non-tender Ext: no edema present  Skin: warm and dry  Psych:  Normal affect and mood   EKG:  The EKG was personally reviewed and demonstrates:  NSR with lead III T wave inversions, follow up EKG demonstrates T wave inversions in lead III, no other changes appreciated   Telemetry:  Telemetry was personally reviewed and demonstrates:  NSR  Laboratory Data:  High Sensitivity Troponin:   Recent Labs  Lab 01/18/24 0109 01/18/24 0314  TROPONINIHS 19* 19*     Chemistry Recent Labs  Lab 01/18/24 0109  NA 139  K 3.5  CL 105  CO2 25  GLUCOSE 181*  BUN 17  CREATININE 1.09*  CALCIUM 9.0  GFRNONAA 57*  ANIONGAP 9    Recent Labs  Lab 01/18/24 0109  PROT 7.1  ALBUMIN 3.7  AST 317*  ALT 134*  ALKPHOS 89  BILITOT  0.9   Lipids No results for input(s): "CHOL", "TRIG", "HDL", "LABVLDL", "LDLCALC", "CHOLHDL" in the last 168 hours.  Hematology Recent Labs  Lab 01/18/24 0109  WBC 9.4  RBC 4.22  HGB 12.8  HCT 37.7  MCV 89.3  MCH 30.3  MCHC 34.0  RDW 13.3  PLT 190   Thyroid No results for input(s): "TSH", "FREET4" in the last 168 hours.  BNPNo results for input(s): "BNP", "PROBNP" in the last 168 hours.  DDimer  Recent Labs  Lab 01/18/24 0126  DDIMER 0.52*     Radiology/Studies:  US Abdomen Limited RUQ (LIVER/GB) Result Date: 01/18/2024 CLINICAL DATA:  Elevated liver function tests. EXAM: ULTRASOUND ABDOMEN LIMITED RIGHT UPPER QUADRANT COMPARISON:  CT scan of same day. FINDINGS: Gallbladder: Cholelithiasis is noted without gallbladder wall thickening or pericholecystic fluid. No sonographic Murphy's sign is noted. Common bile duct: Diameter: 4 mm which is within normal limits. Liver: No focal lesion identified. Normal echogenicity of hepatic parenchyma is noted. Portal vein is patent on color Doppler imaging with normal direction of blood flow towards the liver. Other: None. IMPRESSION: Cholelithiasis without evidence of cholecystitis. Electronically Signed   By: Lupita Raider M.D.   On: 01/18/2024 08:10   CT Angio Chest/Abd/Pel for Dissection W and/or Wo Contrast Result Date: 01/18/2024 CLINICAL DATA:  Acute aortic syndrome, chest pain, nausea EXAM: CT ANGIOGRAPHY CHEST, ABDOMEN AND PELVIS TECHNIQUE: Non-contrast CT of the chest was initially obtained. Multidetector CT imaging through the chest, abdomen and pelvis was performed using the standard protocol during bolus administration of intravenous contrast. Multiplanar reconstructed images and MIPs were obtained and reviewed to evaluate the vascular anatomy. RADIATION DOSE REDUCTION: This exam was performed according  to the departmental dose-optimization program which includes automated exposure control, adjustment of the mA and/or kV according to  patient size and/or use of iterative reconstruction technique. CONTRAST:  OMNIPAQUE IOHEXOL 350 MG/ML SOLN COMPARISON:  CT abdomen pelvis 04/10/2022 FINDINGS: CTA CHEST FINDINGS Cardiovascular: Satisfactory opacification of the pulmonary arteries to the segmental level. No evidence of pulmonary embolism. Normal heart size. No pericardial effusion. Mediastinum/Nodes: No enlarged mediastinal, hilar, or axillary lymph nodes. Thyroid gland, trachea, and esophagus demonstrate no significant findings. Lungs/Pleura: Stable ground-glass opacity within the medial basal right lower lobe adjacent to the right inferior pulmonary vein with mild retraction of the adjacent fissure most in keeping with parenchymal scarring. The lungs are otherwise clear. No pneumothorax or pleural effusion. No central obstructing lesion. Musculoskeletal: No chest wall abnormality. No acute or significant osseous findings. Review of the MIP images confirms the above findings. CTA ABDOMEN AND PELVIS FINDINGS VASCULAR Aorta: Normal caliber aorta without aneurysm, dissection, vasculitis or significant stenosis. Celiac: Patent without evidence of aneurysm, dissection, vasculitis or significant stenosis. SMA: Patent without evidence of aneurysm, dissection, vasculitis or significant stenosis. Renals: Both renal arteries are patent without evidence of aneurysm, dissection, vasculitis, fibromuscular dysplasia or significant stenosis. IMA: Patent without evidence of aneurysm, dissection, vasculitis or significant stenosis. Inflow: Patent without evidence of aneurysm, dissection, vasculitis or significant stenosis. Veins: No obvious venous abnormality within the limitations of this arterial phase study. Review of the MIP images confirms the above findings. NON-VASCULAR Hepatobiliary: No focal liver abnormality is seen. No gallstones, gallbladder wall thickening, or biliary dilatation. Pancreas: Unremarkable Spleen: Unremarkable Adrenals/Urinary Tract:  Adrenal glands are unremarkable. Kidneys are normal, without renal calculi, focal lesion, or hydronephrosis. Bladder is unremarkable. Stomach/Bowel: Stomach is within normal limits. Appendix appears normal. No evidence of bowel wall thickening, distention, or inflammatory changes. Lymphatic: No pathologic adenopathy within the abdomen pelvis. Reproductive: Lobulated, mildly enlarged appearance of the uterus likely relates to underlying uterine fibroids, unchanged from prior examination. The pelvic organs are otherwise unremarkable. Other: No abdominal wall hernia or abnormality. No abdominopelvic ascites. Musculoskeletal: Degenerative changes are seen within the lumbosacral junction. No acute bone abnormality. No lytic or blastic bone lesion. Review of the MIP images confirms the above findings. IMPRESSION: 1. No evidence of aortic dissection or aneurysm. No acute intrathoracic or intra-abdominal pathology identified. 2. Fibroid uterus. Electronically Signed   By: Helyn Numbers M.D.   On: 01/18/2024 02:37   DG Chest 2 View Result Date: 01/18/2024 CLINICAL DATA:  Chest pain since 2 hours ago.  Nausea. EXAM: CHEST - 2 VIEW COMPARISON:  05/25/2022 FINDINGS: Stable cardiomediastinal silhouette. No focal consolidation, pleural effusion, or pneumothorax. No displaced rib fractures. IMPRESSION: No active cardiopulmonary disease. Electronically Signed   By: Minerva Fester M.D.   On: 01/18/2024 01:22     Assessment and Plan:   Atypical chest pain  Patient reports symptoms of atypical chest pain with T wave inversions in lead III on EKG. Repeat EKG is unchanged and the troponin level is flat at 19-->19. She is currently asymptomatic. She does have significant risk factors for CAD including HTN, HLD, Obesity, and T2DM, but flat troponin's, unchanged EKG, and resolution of symptoms is not consistent with ACS or obstructive CAD at this time.  Plan: -Inpatient Coronary CT scan  -No indication to begin heparin    HTN May resume home regimen: amlodipine 10mg , lisinopril 30mg    Elevated AST and ALT Per primary team   Mildly Elevated Scr -Likely due to vomiting -Per primary team  Risk  Assessment/Risk Scores:      [facility] will sign off.     For questions or updates, please contact [facility] Please consult [email or web address] for contact info under    Signed, Faith Rogue, DO  01/18/2024 2:45 PM

## 2024-01-18 NOTE — ED Notes (Signed)
 Pt ambulatory to [facility]

## 2024-01-18 NOTE — Assessment & Plan Note (Signed)
 Patient presenting with midsternal epigastric chest pain differentials include cardiac or GI.  Appreciate cardiology consult.  Will start patient on as needed nitroglycerin.ECG on admission with Q wave in 3, avF with T wave inversion. Appreciate cardiology consult. TFT.lipid.

## 2024-01-18 NOTE — H&P (Signed)
 History and Physical    Patient: Jordan Ramirez:[id number] DOB: August 09, 1961 DOA: 01/18/2024 DOS: the patient was seen and examined on 01/18/2024 PCP: [place], Betty G, MD  Patient coming from: Home Chief complaint: Chief Complaint  Patient presents with   Chest Pain   HPI:  Jordan Ramirez is a 63 y.o. female with past medical history  of diabetes mellitus type 2, essential hypertension, reflux, asthma, anxiety, history of anemia presenting for chest pain points to midsternal upper mid epigastric area reports that it was a bad pain a deep pain something she is not experienced before in her past and forced her to come to the hospital because she was so concerned.  At bedside patient is alert awake oriented did not have any chest pain.  Discussed with patient that it is important that she has a cardiac rule out for her chest pain although there might be a GI component to this I am waiting on blood work to identify if we should explore that further.  As 1 report states that she has gallstones and the other does not.  In the meantime patient is n.p.o. after midnight for a tentative cardiac cath greatly appreciate cardiac consult.  ED Course: Pt in ed at bedside  is alert awake oriented hypertensive. Vital signs in the ED were notable for the following:  Vitals:   01/18/24 1134 01/18/24 1300 01/18/24 1421 01/18/24 1637  BP:  (!) 152/83 (!) 169/99 (!) 145/82  Pulse: 84 83 74 76  Temp:   97.9 F (36.6 C)   Resp: (!) 21 20 20  (!) 21  Height:   5\' 7"  (1.702 m)   Weight:   106.6 kg   SpO2: 98% 98%  98%  TempSrc:   Oral   BMI (Calculated):   36.8    >>Labs were notable for the following: CMP showing glucose of 181 mild AKI with a creatinine of 1.09 and EGFR of 57 transaminitis with AST of 317 and ALT of 134 troponin >>EKG: Independently reviewed: Troponin of 19 x 2. Elevated dimer at 0.52 with a negative CT angio chest >>Additional ED evaluation: Tylenol level less than 10. 1. No evidence of  aortic dissection or aneurysm. No acute intrathoracic or intra-abdominal pathology identified. 2. Fibroid uterus.  >>While in the ED patient received the following: Medications  ondansetron (ZOFRAN) injection 4 mg (0 mg Intravenous Hold 01/18/24 0502)  sodium chloride flush (NS) 0.9 % injection 3 mL (has no administration in time range)  acetaminophen (TYLENOL) tablet 650 mg (has no administration in time range)    Or  acetaminophen (TYLENOL) suppository 650 mg (has no administration in time range)  HYDROcodone-acetaminophen (NORCO/VICODIN) 5-325 MG per tablet 1 tablet (has no administration in time range)  morphine (PF) 2 MG/ML injection 2 mg (has no administration in time range)  heparin injection 5,000 Units (has no administration in time range)  sodium chloride flush (NS) 0.9 % injection 3 mL (has no administration in time range)  sodium chloride flush (NS) 0.9 % injection 3 mL (has no administration in time range)  0.9 %  sodium chloride infusion (has no administration in time range)  hydrALAZINE (APRESOLINE) injection 10 mg (has no administration in time range)  fluticasone (FLONASE) 50 MCG/ACT nasal spray 1 spray (has no administration in time range)  insulin aspart (novoLOG) injection 0-15 Units (has no administration in time range)  nitroGLYCERIN (NITROSTAT) SL tablet 0.4 mg (has no administration in time range)  amLODipine (NORVASC) tablet 10 mg (has no administration  in time range)  alum & mag hydroxide-simeth (MAALOX/MYLANTA) 200-200-20 MG/5ML suspension 30 mL (30 mLs Oral Given 01/18/24 0140)  lidocaine (XYLOCAINE) 2 % viscous mouth solution 15 mL (15 mLs Mouth/Throat Given 01/18/24 0141)  aspirin chewable tablet 324 mg (324 mg Oral Given 01/18/24 0207)  iohexol (OMNIPAQUE) 350 MG/ML injection 125 mL (125 mLs Intravenous Contrast Given 01/18/24 0210)  sodium chloride 0.9 % bolus 1,000 mL (0 mLs Intravenous Stopped 01/18/24 1012)  lactated ringers infusion ( Intravenous New Bag/Given 01/18/24  1649)  Review of Systems  Cardiovascular:  Positive for chest pain.  Gastrointestinal:  Positive for nausea.  All other systems reviewed and are negative.  Past Medical History:  Diagnosis Date   Anemia    Anxiety    Arthritis    Asthma    Chronic low back pain    Diabetes mellitus without complication (HCC)    Fibroid    Hypertension    Neuromuscular disorder [facility]) July 2019   Treatment by Orthopedic specialist   Obesity    Routine general medical examination at a health care facility 07/13/2023   Past Surgical History:  Procedure Laterality Date   PILONIDAL CYST EXCISION     WISDOM TOOTH EXTRACTION      reports that she has never smoked. She has never used smokeless tobacco. She reports that she does not currently use alcohol. She reports that she does not use drugs.  No Known Allergies  Family History  Problem Relation Age of Onset   Coronary artery disease Mother    Diabetes type II Mother    Depression Mother    Hypertension Mother    Arthritis Mother    Diabetes Mother    Hearing loss Mother    Heart disease Mother    Kidney disease Mother    Obesity Mother    Stroke Mother    Kidney failure Father    Arthritis Father    Hearing loss Father    Heart disease Father    Hypertension Father    Kidney disease Father    Obesity Father    Stroke Father    Hypertension Brother    Cancer Maternal Grandmother    Arthritis Maternal Grandmother    Diabetes Maternal Grandmother    Hearing loss Maternal Grandmother    Heart disease Maternal Grandmother    Kidney disease Maternal Grandmother    Obesity Maternal Grandmother    Cancer Paternal Grandfather     Prior to Admission medications   Medication Sig Start Date End Date Taking? Authorizing Provider  amLODipine-olmesartan (AZOR) 10-40 MG tablet Take 1 tablet by mouth daily. 12/28/23  Yes [place], Betty G, MD  Blood Glucose Monitoring Suppl (ONETOUCH VERIO) w/Device KIT Use to test blood sugars once daily.  08/13/20  Yes [place], Betty G, MD  Cholecalciferol (VITAMIN D3 PO) Take 1 tablet by mouth daily.   Yes [provider]  fluticasone (FLONASE) 50 MCG/ACT nasal spray Place 1 spray into both nostrils 2 (two) times daily. 12/28/23  Yes [place], Betty G, MD  glucose blood (ACCU-CHEK GUIDE TEST) test strip Use to test blood sugars 1-2 times daily. 01/06/24  Yes [place], Betty G, MD  Lancets [facility] ULTRASOFT) lancets Use to test blood sugars once daily. 08/13/20  Yes [place], Betty G, MD  metFORMIN (GLUCOPHAGE-XR) 500 MG 24 hr tablet Take 1500 mg by mouth daily (500 mg with breakfast and 1000 mg with supper) 12/12/23  Yes [place], Betty G, MD  Multiple Vitamins-Minerals (ZINC PO) Take 1 tablet  by mouth daily.   Yes [provider]  rosuvastatin (CRESTOR) 10 MG tablet Take 1 tablet (10 mg total) by mouth daily. 07/14/23  Yes [place], Betty G, MD  tiZANidine (ZANAFLEX) 4 MG tablet Take 0.5-1 tablets (2-4 mg total) by mouth at bedtime as needed for muscle spasms. 11/16/23  Yes [place], Betty G, MD                                                                                 Vitals:   01/18/24 1134 01/18/24 1300 01/18/24 1421 01/18/24 1637  BP:  (!) 152/83 (!) 169/99 (!) 145/82  Pulse: 84 83 74 76  Resp: (!) 21 20 20  (!) 21  Temp:   97.9 F (36.6 C)   TempSrc:   Oral   SpO2: 98% 98%  98%  Weight:   106.6 kg   Height:   5\' 7"  (1.702 m)    Physical Exam Vitals and nursing note reviewed.  Constitutional:      General: She is not in acute distress. HENT:     Head: Normocephalic and atraumatic.     Right Ear: Hearing normal.     Left Ear: Hearing normal.     Nose: Nose normal. No nasal deformity.     Mouth/Throat:     Lips: Pink.     Tongue: No lesions.     Pharynx: Oropharynx is clear.  Eyes:     General: Lids are normal.     Extraocular Movements: Extraocular movements intact.  Cardiovascular:     Rate and Rhythm: Normal rate and regular rhythm.     Pulses:          Dorsalis  pedis pulses are 2+ on the right side and 2+ on the left side.       Posterior tibial pulses are 2+ on the right side and 2+ on the left side.     Heart sounds: Normal heart sounds.  Pulmonary:     Effort: Pulmonary effort is normal.     Breath sounds: Normal breath sounds.  Abdominal:     General: Bowel sounds are normal. There is no distension.     Palpations: Abdomen is soft. There is no mass.     Tenderness: There is no abdominal tenderness.  Musculoskeletal:     Right lower leg: No edema.     Left lower leg: No edema.  Skin:    General: Skin is warm.  Neurological:     General: No focal deficit present.     Mental Status: She is alert and oriented to person, place, and time.     Cranial Nerves: Cranial nerves 2-12 are intact.  Psychiatric:        Attention and Perception: Attention normal.        Mood and Affect: Mood normal.        Speech: Speech normal.        Behavior: Behavior normal. Behavior is cooperative.    Labs on Admission: I have personally reviewed following labs and imaging studies  CBC: Recent Labs  Lab 01/18/24 0109  WBC 9.4  HGB 12.8  HCT 37.7  MCV 89.3  PLT 190  Basic Metabolic Panel: Recent Labs  Lab 01/18/24 0109  NA 139  K 3.5  CL 105  CO2 25  GLUCOSE 181*  BUN 17  CREATININE 1.09*  CALCIUM 9.0   GFR: Estimated Creatinine Clearance: 66.4 mL/min (A) (by C-G formula based on SCr of 1.09 mg/dL (H)). Liver Function Tests: Recent Labs  Lab 01/18/24 0109  AST 317*  ALT 134*  ALKPHOS 89  BILITOT 0.9  PROT 7.1  ALBUMIN 3.7   Recent Labs  Lab 01/18/24 0109  LIPASE 39   No results for input(s): "AMMONIA" in the last 168 hours. Coagulation Profile: Recent Labs  Lab 01/18/24 0109  INR 0.9   Cardiac Enzymes: No results for input(s): "CKTOTAL", "CKMB", "CKMBINDEX", "TROPONINI" in the last 168 hours. BNP (last 3 results) No results for input(s): "PROBNP" in the last 8760 hours. HbA1C: Recent Labs    01/18/24 1648  HGBA1C  6.3*   CBG: Recent Labs  Lab 01/18/24 1447 01/18/24 1636  GLUCAP 79 76   Lipid Profile: No results for input(s): "CHOL", "HDL", "LDLCALC", "TRIG", "CHOLHDL", "LDLDIRECT" in the last 72 hours. Thyroid Function Tests: No results for input(s): "TSH", "T4TOTAL", "FREET4", "T3FREE", "THYROIDAB" in the last 72 hours. Anemia Panel: No results for input(s): "VITAMINB12", "FOLATE", "FERRITIN", "TIBC", "IRON", "RETICCTPCT" in the last 72 hours. Urine analysis:    Component Value Date/Time   COLORURINE STRAW (A) 04/10/2022 0712   APPEARANCEUR CLEAR 04/10/2022 0712   LABSPEC 1.014 04/10/2022 0712   PHURINE 6.0 04/10/2022 0712   GLUCOSEU >=500 (A) 04/10/2022 0712   GLUCOSEU NEGATIVE 02/13/2020 1150   HGBUR NEGATIVE 04/10/2022 0712   BILIRUBINUR NEGATIVE 04/10/2022 0712   KETONESUR 5 (A) 04/10/2022 0712   PROTEINUR NEGATIVE 04/10/2022 0712   UROBILINOGEN 0.2 02/13/2020 1150   NITRITE NEGATIVE 04/10/2022 0712   LEUKOCYTESUR MODERATE (A) 04/10/2022 0712   Radiological Exams on Admission: US Abdomen Limited RUQ (LIVER/GB) Result Date: 01/18/2024 CLINICAL DATA:  Elevated liver function tests. EXAM: ULTRASOUND ABDOMEN LIMITED RIGHT UPPER QUADRANT COMPARISON:  CT scan of same day. FINDINGS: Gallbladder: Cholelithiasis is noted without gallbladder wall thickening or pericholecystic fluid. No sonographic Murphy's sign is noted. Common bile duct: Diameter: 4 mm which is within normal limits. Liver: No focal lesion identified. Normal echogenicity of hepatic parenchyma is noted. Portal vein is patent on color Doppler imaging with normal direction of blood flow towards the liver. Other: None. IMPRESSION: Cholelithiasis without evidence of cholecystitis. Electronically Signed   By: Lupita Raider M.D.   On: 01/18/2024 08:10   CT Angio Chest/Abd/Pel for Dissection W and/or Wo Contrast Result Date: 01/18/2024 CLINICAL DATA:  Acute aortic syndrome, chest pain, nausea EXAM: CT ANGIOGRAPHY CHEST, ABDOMEN AND  PELVIS TECHNIQUE: Non-contrast CT of the chest was initially obtained. Multidetector CT imaging through the chest, abdomen and pelvis was performed using the standard protocol during bolus administration of intravenous contrast. Multiplanar reconstructed images and MIPs were obtained and reviewed to evaluate the vascular anatomy. RADIATION DOSE REDUCTION: This exam was performed according to the departmental dose-optimization program which includes automated exposure control, adjustment of the mA and/or kV according to patient size and/or use of iterative reconstruction technique. CONTRAST:  OMNIPAQUE IOHEXOL 350 MG/ML SOLN COMPARISON:  CT abdomen pelvis 04/10/2022 FINDINGS: CTA CHEST FINDINGS Cardiovascular: Satisfactory opacification of the pulmonary arteries to the segmental level. No evidence of pulmonary embolism. Normal heart size. No pericardial effusion. Mediastinum/Nodes: No enlarged mediastinal, hilar, or axillary lymph nodes. Thyroid gland, trachea, and esophagus demonstrate no significant findings. Lungs/Pleura: Stable ground-glass  opacity within the medial basal right lower lobe adjacent to the right inferior pulmonary vein with mild retraction of the adjacent fissure most in keeping with parenchymal scarring. The lungs are otherwise clear. No pneumothorax or pleural effusion. No central obstructing lesion. Musculoskeletal: No chest wall abnormality. No acute or significant osseous findings. Review of the MIP images confirms the above findings. CTA ABDOMEN AND PELVIS FINDINGS VASCULAR Aorta: Normal caliber aorta without aneurysm, dissection, vasculitis or significant stenosis. Celiac: Patent without evidence of aneurysm, dissection, vasculitis or significant stenosis. SMA: Patent without evidence of aneurysm, dissection, vasculitis or significant stenosis. Renals: Both renal arteries are patent without evidence of aneurysm, dissection, vasculitis, fibromuscular dysplasia or significant stenosis.  IMA: Patent without evidence of aneurysm, dissection, vasculitis or significant stenosis. Inflow: Patent without evidence of aneurysm, dissection, vasculitis or significant stenosis. Veins: No obvious venous abnormality within the limitations of this arterial phase study. Review of the MIP images confirms the above findings. NON-VASCULAR Hepatobiliary: No focal liver abnormality is seen. No gallstones, gallbladder wall thickening, or biliary dilatation. Pancreas: Unremarkable Spleen: Unremarkable Adrenals/Urinary Tract: Adrenal glands are unremarkable. Kidneys are normal, without renal calculi, focal lesion, or hydronephrosis. Bladder is unremarkable. Stomach/Bowel: Stomach is within normal limits. Appendix appears normal. No evidence of bowel wall thickening, distention, or inflammatory changes. Lymphatic: No pathologic adenopathy within the abdomen pelvis. Reproductive: Lobulated, mildly enlarged appearance of the uterus likely relates to underlying uterine fibroids, unchanged from prior examination. The pelvic organs are otherwise unremarkable. Other: No abdominal wall hernia or abnormality. No abdominopelvic ascites. Musculoskeletal: Degenerative changes are seen within the lumbosacral junction. No acute bone abnormality. No lytic or blastic bone lesion. Review of the MIP images confirms the above findings. IMPRESSION: 1. No evidence of aortic dissection or aneurysm. No acute intrathoracic or intra-abdominal pathology identified. 2. Fibroid uterus. Electronically Signed   By: Helyn Numbers M.D.   On: 01/18/2024 02:37   DG Chest 2 View Result Date: 01/18/2024 CLINICAL DATA:  Chest pain since 2 hours ago.  Nausea. EXAM: CHEST - 2 VIEW COMPARISON:  05/25/2022 FINDINGS: Stable cardiomediastinal silhouette. No focal consolidation, pleural effusion, or pneumothorax. No displaced rib fractures. IMPRESSION: No active cardiopulmonary disease. Electronically Signed   By: Minerva Fester M.D.   On: 01/18/2024 01:22    Data Reviewed: Relevant notes from primary care and specialist visits, past discharge summaries as available in EHR, including Care Everywhere. Prior diagnostic testing as pertinent to current admission diagnoses, Updated medications and problem lists for reconciliation ED course, including vitals, labs, imaging, treatment and response to treatment,Triage notes, nursing and pharmacy notes and ED provider's notes Notable results as noted in HPI.Discussed case with EDMD/ ED APP/ or Specialty MD on call and as needed. Assessment & Plan Chest pressure Patient presenting with midsternal epigastric chest pain differentials include cardiac or GI.  Appreciate cardiology consult.  Will start patient on as needed nitroglycerin.ECG on admission with Q wave in 3, avF with T wave inversion. Appreciate cardiology consult. TFT.lipid.   Essential hypertension Will continue patient on amlodipine and hold ARB therapy are due to mild AKI. As needed hydralazine. Diabetes mellitus type 2 with neurological manifestations (HCC) Glycemic protocol.  With Accu-Cheks every 4 hours.A1c Carb consistent diet . Nausea and vomiting Again this could be related to GI, waiting for ggt . Antiemetic and IV ppi.   Transaminitis    Latest Ref Rng & Units 01/18/2024    1:09 AM 07/14/2023    1:27 PM 04/10/2022    8:05 AM  Hepatic Function  Total Protein 6.5 - 8.1 g/dL 7.1  7.3  7.8   Albumin 3.5 - 5.0 g/dL 3.7  4.2  4.2   AST 15 - 41 U/L 317  16  23   ALT 0 - 44 U/L 134  12  24   Alk Phosphatase 38 - 126 U/L 89  74  62   Total Bilirubin 0.0 - 1.2 mg/dL 0.9  0.8  0.7   Bilirubin, Direct 0.0 - 0.2 mg/dL 0.4       2/2 to fatty liver or hepatic congestion and wait for ggt to decide on MRCP. Follow and GI consult per am team. Hold rosuvastatin.   DVT prophylaxis:  Heparin  Consults:  Cardiology.  Advance Care Planning:    Code Status: Full Code   Family Communication:  None  Disposition Plan:  Home. Severity of  Illness: The appropriate patient status for this patient is OBSERVATION. Observation status is judged to be reasonable and necessary in order to provide the required intensity of service to ensure the patient's safety. The patient's presenting symptoms, physical exam findings, and initial radiographic and laboratory data in the context of their medical condition is felt to place them at decreased risk for further clinical deterioration. Furthermore, it is anticipated that the patient will be medically stable for discharge from the hospital within 2 midnights of admission.   Author: Gertha Calkin, MD 01/18/2024 7:41 PM  For on call review [email or web address].   Unresulted Labs (From admission, onward)     Start     Ordered   01/19/24 0500  Comprehensive metabolic panel  Tomorrow morning,   R        01/18/24 1622   01/19/24 0500  CBC  Tomorrow morning,   R        01/18/24 1622   01/19/24 0500  Lipid panel  Tomorrow morning,   R        01/18/24 1747   01/19/24 0500  Lipoprotein A (LPA)  Tomorrow morning,   R        01/18/24 1747   01/18/24 1936  TSH  Add-on,   AD        01/18/24 1935            Orders Placed This Encounter  Procedures   DG Chest 2 View   CT Angio Chest/Abd/Pel for Dissection W and/or Wo Contrast   US Abdomen Limited RUQ (LIVER/GB)   CT CORONARY MORPH W/CTA COR W/SCORE W/CA W/CM &/OR WO/CM   CT CHEST WO CONTRAST   Basic metabolic panel   CBC   Protime-INR (order if Patient is taking Coumadin / Warfarin)   D-dimer, quantitative   Hepatic function panel   Lipase, blood   Hepatitis panel, acute   Acetaminophen level   Glucose, capillary   HIV Antibody (routine testing w rflx)   Comprehensive metabolic panel   CBC   Hemoglobin A1c   Gamma GT   Glucose, capillary   Lipid panel   Lipoprotein A (LPA)   TSH   Diet Carb Modified Fluid consistency: Thin; Room service appropriate? Yes   Document Height and Actual Weight   Saline Lock IV, Maintain IV access (when  placed in a treatment room)   Fluid Challenge   Fluid Challenge   Cardiac Monitoring - Continuous Indefinite   Maintain IV access   Vital signs   Notify physician (specify)   Mobility Protocol: No Restrictions RN to initiate protocols based on patient's level of care  Refer to Sidebar Report Refer to ICU, Med-Surg, Progressive, and Step-Down Mobility Protocol Sidebars   Initiate Adult Central Line Maintenance and Catheter Protocol for patients with central line (CVC, PICC, Port, Hemodialysis, Trialysis)   Daily weights   Intake and Output   Do not place and if present remove PureWick   Initiate Oral Care Protocol   Initiate Carrier Fluid Protocol   RN may order General Admission PRN Orders utilizing "General Admission PRN medications" (through manage orders) for the following patient needs: allergy symptoms (Claritin), cold sores (Carmex), cough (Robitussin DM), eye irritation (Liquifilm Tears), hemorrhoids (Tucks), indigestion (Maalox), minor skin irritation (Hydrocortisone Cream), muscle pain Romeo Apple Gay), nose irritation (saline nasal spray) and sore throat (Chloraseptic spray).   SCDs   Patient has an active order for admit to inpatient/place in observation   Apply Diabetes Mellitus Care Plan   STAT CBG when hypoglycemia is suspected. If treated, recheck every 15 minutes after each treatment until CBG >/= 70 mg/dl   Refer to Hypoglycemia Protocol Sidebar Report for treatment of CBG < 70 mg/dl   No HS correction Insulin   Care order/instruction: When pt is NPO after MD change the glycemic protocol to change to accucheck q4 hours x 12 hours.   Full code   Consult to hospitalist  Chest pain, vomiting, transaminitis   Pulse oximetry check with vital signs   Oxygen therapy Mode or (Route): Nasal cannula; Liters Per Minute: 2; Keep O2 saturation between: greater than 92 %   ED EKG   EKG 12-Lead   EKG 12-Lead   Insert peripheral IV   Admit to Inpatient (patient's expected length of stay  will be greater than 2 midnights or inpatient only procedure)

## 2024-01-18 NOTE — Progress Notes (Signed)
 Pt arrived to unit from  [facility] ED . VSS, A/O x 4,  CCMD called ,CHG given, pt oriented to unit,Will continue to monitor.   Jordan Ramirez Melanie Pellot, RN    01/18/24 1421  Vitals  Temp 97.9 F (36.6 C)  Temp Source Oral  BP (!) 169/99  MAP (mmHg) 116  BP Location Right Arm  BP Method Automatic  Patient Position (if appropriate) Sitting  Pulse Rate 74  Pulse Rate Source Monitor  ECG Heart Rate 72  Resp 20  Level of Consciousness  Level of Consciousness Alert  Oxygen Therapy  O2 Device Room Air  O2 Flow Rate (L/min) 0 L/min  ECG Monitoring  Cardiac Rhythm NSR  Telemetry Box Number [id number]  Tele Box Verification Completed by Second Verifier Completed  Pain Assessment  Pain Scale 0-10  Pain Score 0  Glasgow Coma Scale  Eye Opening 4  Best Verbal Response (NON-intubated) 5  Best Motor Response 6  Glasgow Coma Scale Score 15  Height and Weight  Height 5\' 7"  (1.702 m)  Weight 106.6 kg  BSA (Calculated - sq m) 2.24 sq meters  BMI (Calculated) 36.8  Weight in (lb) to have BMI = 25 159.3  MEWS Score  MEWS Temp 0  MEWS Systolic 0  MEWS Pulse 0  MEWS RR 0  MEWS LOC 0  MEWS Score 0  MEWS Score Color Green

## 2024-01-18 NOTE — ED Notes (Signed)
 Patient transported to X-ray

## 2024-01-18 NOTE — ED Triage Notes (Signed)
 Pt states CP started 2 hrs ago +nausea No radiation Denies [facility]

## 2024-01-18 NOTE — Assessment & Plan Note (Signed)
 Again this could be related to GI, waiting for ggt . Antiemetic and IV ppi.

## 2024-01-19 ENCOUNTER — Observation Stay (HOSPITAL_COMMUNITY)

## 2024-01-19 ENCOUNTER — Encounter (HOSPITAL_BASED_OUTPATIENT_CLINIC_OR_DEPARTMENT_OTHER)

## 2024-01-19 DIAGNOSIS — E1169 Type 2 diabetes mellitus with other specified complication: Secondary | ICD-10-CM

## 2024-01-19 DIAGNOSIS — R079 Chest pain, unspecified: Principal | ICD-10-CM | POA: Diagnosis present

## 2024-01-19 DIAGNOSIS — I10 Essential (primary) hypertension: Secondary | ICD-10-CM | POA: Diagnosis not present

## 2024-01-19 DIAGNOSIS — E669 Obesity, unspecified: Secondary | ICD-10-CM | POA: Diagnosis not present

## 2024-01-19 DIAGNOSIS — R0789 Other chest pain: Secondary | ICD-10-CM | POA: Diagnosis not present

## 2024-01-19 LAB — CBC
HCT: 34.5 % — ABNORMAL LOW (ref 36.0–46.0)
Hemoglobin: 11.7 g/dL — ABNORMAL LOW (ref 12.0–15.0)
MCH: 29.8 pg (ref 26.0–34.0)
MCHC: 33.9 g/dL (ref 30.0–36.0)
MCV: 88 fL (ref 80.0–100.0)
Platelets: 184 10*3/uL (ref 150–400)
RBC: 3.92 MIL/uL (ref 3.87–5.11)
RDW: 13.4 % (ref 11.5–15.5)
WBC: 5.1 10*3/uL (ref 4.0–10.5)
nRBC: 0 % (ref 0.0–0.2)

## 2024-01-19 LAB — COMPREHENSIVE METABOLIC PANEL WITH GFR
ALT: 269 U/L — ABNORMAL HIGH (ref 0–44)
AST: 159 U/L — ABNORMAL HIGH (ref 15–41)
Albumin: 3.2 g/dL — ABNORMAL LOW (ref 3.5–5.0)
Alkaline Phosphatase: 79 U/L (ref 38–126)
Anion gap: 11 (ref 5–15)
BUN: 13 mg/dL (ref 8–23)
CO2: 24 mmol/L (ref 22–32)
Calcium: 8.6 mg/dL — ABNORMAL LOW (ref 8.9–10.3)
Chloride: 105 mmol/L (ref 98–111)
Creatinine, Ser: 0.94 mg/dL (ref 0.44–1.00)
GFR, Estimated: >60 mL/min (ref >60)
Glucose, Bld: 139 mg/dL — ABNORMAL HIGH (ref 70–99)
Potassium: 3.4 mmol/L — ABNORMAL LOW (ref 3.5–5.1)
Sodium: 140 mmol/L (ref 135–145)
Total Bilirubin: 0.7 mg/dL (ref 0.0–1.2)
Total Protein: 6.1 g/dL — ABNORMAL LOW (ref 6.5–8.1)

## 2024-01-19 LAB — LIPID PANEL
Cholesterol: 137 mg/dL (ref 0–200)
HDL: 65 mg/dL (ref >40)
LDL Cholesterol: 61 mg/dL (ref 0–99)
Total CHOL/HDL Ratio: 2.1 ratio
Triglycerides: 57 mg/dL (ref <150)
VLDL: 11 mg/dL (ref 0–40)

## 2024-01-19 LAB — GLUCOSE, CAPILLARY
Glucose-Capillary: 118 mg/dL — ABNORMAL HIGH (ref 70–99)
Glucose-Capillary: 120 mg/dL — ABNORMAL HIGH (ref 70–99)

## 2024-01-19 MED ORDER — POTASSIUM CHLORIDE CRYS ER 20 MEQ PO TBCR
20.0000 meq | EXTENDED_RELEASE_TABLET | Freq: Two times a day (BID) | ORAL | Status: DC
  Administered 2024-01-19: 20 meq via ORAL
  Filled 2024-01-19: qty 1

## 2024-01-19 MED ORDER — METOPROLOL TARTRATE 100 MG PO TABS
100.0000 mg | ORAL_TABLET | Freq: Once | ORAL | Status: AC
  Administered 2024-01-19: 100 mg via ORAL
  Filled 2024-01-19: qty 1

## 2024-01-19 MED ORDER — NITROGLYCERIN 0.4 MG SL SUBL
0.8000 mg | SUBLINGUAL_TABLET | Freq: Once | SUBLINGUAL | Status: AC
  Administered 2024-01-19: 0.8 mg via SUBLINGUAL

## 2024-01-19 MED ORDER — IOHEXOL 350 MG/ML SOLN
95.0000 mL | Freq: Once | INTRAVENOUS | Status: AC | PRN
  Administered 2024-01-19: 95 mL via INTRAVENOUS

## 2024-01-19 MED ORDER — POTASSIUM CHLORIDE CRYS ER 20 MEQ PO TBCR
20.0000 meq | EXTENDED_RELEASE_TABLET | Freq: Every day | ORAL | 0 refills | Status: AC
Start: 2024-01-19 — End: 2024-01-22

## 2024-01-19 MED ORDER — NITROGLYCERIN 0.4 MG SL SUBL
SUBLINGUAL_TABLET | SUBLINGUAL | Status: AC
  Filled 2024-01-19: qty 2

## 2024-01-19 MED ORDER — PANTOPRAZOLE SODIUM 40 MG PO TBEC
40.0000 mg | DELAYED_RELEASE_TABLET | Freq: Every day | ORAL | 1 refills | Status: AC
Start: 2024-01-19 — End: 2025-01-18

## 2024-01-19 NOTE — Progress Notes (Signed)
 DISCHARGE NOTE HOME Jordan Ramirez to be discharged Home per MD order. Discussed prescriptions and follow up appointments with the patient. Prescriptions given to patient; medication list explained in detail. Patient verbalized understanding.  Skin clean, dry and intact without evidence of skin break down, no evidence of skin tears noted. IV catheter discontinued intact. Site without signs and symptoms of complications. Dressing and pressure applied. Pt denies pain at the site currently. No complaints noted.  Patient free of lines, drains, and wounds.   An After Visit Summary (AVS) was printed and given to the patient. Patient escorted via wheelchair, and discharged home via private auto.  Velia Meyer, RN

## 2024-01-19 NOTE — Discharge Summary (Signed)
 Physician Discharge Summary  Jordan Ramirez WUJ:[id number] DOB: 1961-01-31 DOA: 01/18/2024  PCP: [place], Betty G, MD  Admit date: 01/18/2024 Discharge date: 01/19/2024  Admitted From: Home Disposition: Home  Recommendations for Outpatient Follow-up:  Follow up with PCP in 1-2 weeks Please obtain CMP/CBC in one week Will send referral to surgery for follow-up, if you do not hear from them, call them for appointment.  Discharge Condition: Stable CODE STATUS: Full code Diet recommendation: Low-salt and low-carb diet  Discharge summary: 63 year old with history of type 2 diabetes on metformin, essential hypertension, GERD who presented to the hospital with midsternal upper midepigastric pain, sudden onset with no radiation.  She had 1 episode of vomiting associated with it.  Previous occasional history of dyspepsia.  In the emergency room hemodynamically stable.  Troponins were normal.  D-dimer 0.52 with negative CT angiogram of the chest.  EKG with nonspecific ST-T wave changes.  She was admitted for observation and cardiology follow-up.  Patient underwent coronary CT scan with normal coronary arteries.  Chest pain/ epigastric pain: Acute coronary syndrome ruled out.  Less likely cardiac cause.  Liver ultrasound consistent with cholelithiasis without evidence of cholecystitis.  Patient currently chest pain-free.  She does have mildly elevated transaminases with normal bilirubin.  Normal bile duct caliber on ultrasound.  Her pain was likely from reflux disease or gallbladder spasm. Currently asymptomatic.  Conservative management. Hold statin until repeat LFT in 1 to 2 weeks to ensure normalization of the levels. Patient may benefit with cholecystectomy, will send referral to general surgery for outpatient follow-up. Will prescribe Protonix 40 mg daily with history of GERD. Potassium replacement. Resume home medications for blood pressure. Hold metformin for next 24 hours due to use of consecutive  contrast last 2 days.  Stable for discharge with outpatient follow-up.     Discharge Diagnoses:  Principal Problem:   Chest pressure Active Problems:   Diabetes mellitus type 2 with neurological manifestations (HCC)   Morbid obesity (HCC)-BMI 37.4. Co morbilities HTN,DM II,HLD,OA-urine incontinence.   Essential hypertension   Nausea and vomiting   Transaminitis   Chest pain    Discharge Instructions  Discharge Instructions     Ambulatory referral to General Surgery   Complete by: As directed    Diet - low sodium heart healthy   Complete by: As directed    Diet Carb Modified   Complete by: As directed    Increase activity slowly   Complete by: As directed       Allergies as of 01/19/2024   No Known Allergies      Medication List     PAUSE taking these medications    metFORMIN 500 MG 24 hr tablet Wait to take this until: January 21, 2024 Morning Commonly known as: GLUCOPHAGE-XR Take 1500 mg by mouth daily (500 mg with breakfast and 1000 mg with supper)   rosuvastatin 10 MG tablet Wait to take this until: January 26, 2024 Commonly known as: Crestor Take 1 tablet (10 mg total) by mouth daily.       TAKE these medications    Accu-Chek Guide Test test strip Generic drug: glucose blood Use to test blood sugars 1-2 times daily.   amLODipine-olmesartan 10-40 MG tablet Commonly known as: AZOR Take 1 tablet by mouth daily.   fluticasone 50 MCG/ACT nasal spray Commonly known as: FLONASE Place 1 spray into both nostrils 2 (two) times daily.   onetouch ultrasoft lancets Use to test blood sugars once daily.   OneTouch Verio w/Device Kit  Use to test blood sugars once daily.   pantoprazole 40 MG tablet Commonly known as: Protonix Take 1 tablet (40 mg total) by mouth daily.   potassium chloride SA 20 MEQ tablet Commonly known as: KLOR-CON M Take 1 tablet (20 mEq total) by mouth daily for 3 days.   tiZANidine 4 MG tablet Commonly known as: ZANAFLEX Take  0.5-1 tablets (2-4 mg total) by mouth at bedtime as needed for muscle spasms.   VITAMIN D3 PO Take 1 tablet by mouth daily.   ZINC PO Take 1 tablet by mouth daily.        Follow-up Information     [place], Betty G, MD Follow up in 1 week(s).   Specialty: Family Medicine Contact information: [address] [place] [address] [phone]         Surgery, [place]. Schedule an appointment as soon as possible for a visit.   Specialty: General Surgery Why: call if do not hear from them in one week Contact information: [address] [place] [address] [phone]                No Known Allergies  Consultations: Cardiology   Procedures/Studies: CT CORONARY MORPH W/CTA COR W/SCORE W/CA W/CM &/OR WO/CM Result Date: 01/19/2024 HISTORY: Chest pain/anginal equiv, ECGs or troponins abnormal EXAM: Cardiac/Coronary CT TECHNIQUE: The patient was scanned on a [employer]. PROTOCOL: A 100 kV prospective scan was triggered in the descending thoracic aorta at 111 HU's. Axial non-contrast 3 mm slices were carried out through the heart. The data set was analyzed on a dedicated work station and scored using the Agatston method. Gantry rotation speed was 250 msecs and collimation was 0.6 mm. Heart rate was optimized medically and sl NTG was given. The 3D data set was reconstructed in 5% intervals of the 35-75 % of the R-R cycle. Systolic and diastolic phases were analyzed on a dedicated work station using MPR, MIP and VRT modes. The patient received 95mL OMNIPAQUE IOHEXOL 350 MG/ML SOLN of contrast. FINDINGS: Coronary calcium score: The patient's coronary artery calcium score is 0, which places the patient in the 0 percentile. Coronary arteries: Normal coronary origins.  Right dominance. Right Coronary Artery: Normal caliber vessel, gives rise to PDA. No significant plaque or stenosis. Left Main Coronary Artery: Normal caliber vessel. No significant  plaque or stenosis. Left Anterior Descending Coronary Artery: Normal caliber vessel. No significant plaque or stenosis. Gives rise to small first, small second, normal third diagonal branches. Distal LAD wraps apex. Left Circumflex Artery: Normal caliber vessel. No significant plaque or stenosis. Gives rise to small first, large second OM branches. Aorta: Normal size, 34 mm at the mid ascending aorta (level of the PA bifurcation) measured double oblique. No aortic atherosclerosis. No dissection seen in visualized portions of the aorta. Aortic Valve: No calcifications. Trileaflet. Other findings: Normal pulmonary vein drainage into the left atrium. Normal left atrial appendage without a thrombus. Normal size of the pulmonary artery. Normal appearance of the pericardium. LV function/wall motion appears normal. Mild signal to noise artifact IMPRESSION: 1. No evidence of CAD, CADRADS = 0. 2. Coronary calcium score of 0. This was 0 percentile for age-, sex-, and race- matched controls. 3. Total plaque volume is pending. Will addend study once this is available. 4. Normal coronary origin with right dominance. 5.  Normal appearance of LV function/wall motion. INTERPRETATION: CAD-RADS 0: No evidence of CAD (0%). Consider non-atherosclerotic causes of chest pain. Electronically Signed  By: Jodelle Red M.D.   On: 01/19/2024 12:58   US Abdomen Limited RUQ (LIVER/GB) Result Date: 01/18/2024 CLINICAL DATA:  Elevated liver function tests. EXAM: ULTRASOUND ABDOMEN LIMITED RIGHT UPPER QUADRANT COMPARISON:  CT scan of same day. FINDINGS: Gallbladder: Cholelithiasis is noted without gallbladder wall thickening or pericholecystic fluid. No sonographic Murphy's sign is noted. Common bile duct: Diameter: 4 mm which is within normal limits. Liver: No focal lesion identified. Normal echogenicity of hepatic parenchyma is noted. Portal vein is patent on color Doppler imaging with normal direction of blood flow towards the  liver. Other: None. IMPRESSION: Cholelithiasis without evidence of cholecystitis. Electronically Signed   By: Lupita Raider M.D.   On: 01/18/2024 08:10   CT Angio Chest/Abd/Pel for Dissection W and/or Wo Contrast Result Date: 01/18/2024 CLINICAL DATA:  Acute aortic syndrome, chest pain, nausea EXAM: CT ANGIOGRAPHY CHEST, ABDOMEN AND PELVIS TECHNIQUE: Non-contrast CT of the chest was initially obtained. Multidetector CT imaging through the chest, abdomen and pelvis was performed using the standard protocol during bolus administration of intravenous contrast. Multiplanar reconstructed images and MIPs were obtained and reviewed to evaluate the vascular anatomy. RADIATION DOSE REDUCTION: This exam was performed according to the departmental dose-optimization program which includes automated exposure control, adjustment of the mA and/or kV according to patient size and/or use of iterative reconstruction technique. CONTRAST:  OMNIPAQUE IOHEXOL 350 MG/ML SOLN COMPARISON:  CT abdomen pelvis 04/10/2022 FINDINGS: CTA CHEST FINDINGS Cardiovascular: Satisfactory opacification of the pulmonary arteries to the segmental level. No evidence of pulmonary embolism. Normal heart size. No pericardial effusion. Mediastinum/Nodes: No enlarged mediastinal, hilar, or axillary lymph nodes. Thyroid gland, trachea, and esophagus demonstrate no significant findings. Lungs/Pleura: Stable ground-glass opacity within the medial basal right lower lobe adjacent to the right inferior pulmonary vein with mild retraction of the adjacent fissure most in keeping with parenchymal scarring. The lungs are otherwise clear. No pneumothorax or pleural effusion. No central obstructing lesion. Musculoskeletal: No chest wall abnormality. No acute or significant osseous findings. Review of the MIP images confirms the above findings. CTA ABDOMEN AND PELVIS FINDINGS VASCULAR Aorta: Normal caliber aorta without aneurysm, dissection, vasculitis or  significant stenosis. Celiac: Patent without evidence of aneurysm, dissection, vasculitis or significant stenosis. SMA: Patent without evidence of aneurysm, dissection, vasculitis or significant stenosis. Renals: Both renal arteries are patent without evidence of aneurysm, dissection, vasculitis, fibromuscular dysplasia or significant stenosis. IMA: Patent without evidence of aneurysm, dissection, vasculitis or significant stenosis. Inflow: Patent without evidence of aneurysm, dissection, vasculitis or significant stenosis. Veins: No obvious venous abnormality within the limitations of this arterial phase study. Review of the MIP images confirms the above findings. NON-VASCULAR Hepatobiliary: No focal liver abnormality is seen. No gallstones, gallbladder wall thickening, or biliary dilatation. Pancreas: Unremarkable Spleen: Unremarkable Adrenals/Urinary Tract: Adrenal glands are unremarkable. Kidneys are normal, without renal calculi, focal lesion, or hydronephrosis. Bladder is unremarkable. Stomach/Bowel: Stomach is within normal limits. Appendix appears normal. No evidence of bowel wall thickening, distention, or inflammatory changes. Lymphatic: No pathologic adenopathy within the abdomen pelvis. Reproductive: Lobulated, mildly enlarged appearance of the uterus likely relates to underlying uterine fibroids, unchanged from prior examination. The pelvic organs are otherwise unremarkable. Other: No abdominal wall hernia or abnormality. No abdominopelvic ascites. Musculoskeletal: Degenerative changes are seen within the lumbosacral junction. No acute bone abnormality. No lytic or blastic bone lesion. Review of the MIP images confirms the above findings. IMPRESSION: 1. No evidence of aortic dissection or aneurysm. No acute intrathoracic or intra-abdominal pathology identified. 2.  Fibroid uterus. Electronically Signed   By: Helyn Numbers M.D.   On: 01/18/2024 02:37   DG Chest 2 View Result Date: 01/18/2024 CLINICAL  DATA:  Chest pain since 2 hours ago.  Nausea. EXAM: CHEST - 2 VIEW COMPARISON:  05/25/2022 FINDINGS: Stable cardiomediastinal silhouette. No focal consolidation, pleural effusion, or pneumothorax. No displaced rib fractures. IMPRESSION: No active cardiopulmonary disease. Electronically Signed   By: Minerva Fester M.D.   On: 01/18/2024 01:22   (Echo, Carotid, EGD, Colonoscopy, ERCP)    Subjective: Patient examined in the morning rounds.  Denies any complaints.  Denied any nausea vomiting.  She felt funny after taking metoprolol however without any dizziness lightheadedness.  Abdomen and chest pain has improved.  Eager to go home. Called and updated the patient about normal coronary artery scan.   Discharge Exam: Vitals:   01/19/24 1204 01/19/24 1223  BP: 127/81 134/71  Pulse:    Resp:  19  Temp:  98.1 F (36.7 C)  SpO2:     Vitals:   01/19/24 0747 01/19/24 1148 01/19/24 1204 01/19/24 1223  BP: (!) 141/81 116/70 127/81 134/71  Pulse:      Resp: 16   19  Temp: 97.9 F (36.6 C)   98.1 F (36.7 C)  TempSrc: Oral   Oral  SpO2: 98%     Weight:      Height:        General: Pt is alert, awake, not in acute distress Cardiovascular: RRR, S1/S2 +, no rubs, no gallops Respiratory: CTA bilaterally, no wheezing, no rhonchi Abdominal: Soft, NT, ND, bowel sounds + Extremities: no edema, no cyanosis    The results of significant diagnostics from this hospitalization (including imaging, microbiology, ancillary and laboratory) are listed below for reference.     Microbiology: No results found for this or any previous visit (from the past 240 hours).   Labs: BNP (last 3 results) No results for input(s): "BNP" in the last 8760 hours. Basic Metabolic Panel: Recent Labs  Lab 01/18/24 0109 01/19/24 0335  NA 139 140  K 3.5 3.4*  CL 105 105  CO2 25 24  GLUCOSE 181* 139*  BUN 17 13  CREATININE 1.09* 0.94  CALCIUM 9.0 8.6*   Liver Function Tests: Recent Labs  Lab 01/18/24 0109  01/19/24 0335  AST 317* 159*  ALT 134* 269*  ALKPHOS 89 79  BILITOT 0.9 0.7  PROT 7.1 6.1*  ALBUMIN 3.7 3.2*   Recent Labs  Lab 01/18/24 0109  LIPASE 39   No results for input(s): "AMMONIA" in the last 168 hours. CBC: Recent Labs  Lab 01/18/24 0109 01/19/24 0335  WBC 9.4 5.1  HGB 12.8 11.7*  HCT 37.7 34.5*  MCV 89.3 88.0  PLT 190 184   Cardiac Enzymes: No results for input(s): "CKTOTAL", "CKMB", "CKMBINDEX", "TROPONINI" in the last 168 hours. BNP: Invalid input(s): "POCBNP" CBG: Recent Labs  Lab 01/18/24 1447 01/18/24 1636 01/18/24 2059 01/19/24 0552 01/19/24 1224  GLUCAP 79 76 148* 118* 120*   D-Dimer Recent Labs    01/18/24 0126  DDIMER 0.52*   Hgb A1c Recent Labs    01/18/24 1648  HGBA1C 6.3*   Lipid Profile Recent Labs    01/19/24 0335  CHOL 137  HDL 65  LDLCALC 61  TRIG 57  CHOLHDL 2.1   Thyroid function studies Recent Labs    01/18/24 1647  TSH 0.881   Anemia work up No results for input(s): "VITAMINB12", "FOLATE", "FERRITIN", "TIBC", "IRON", "RETICCTPCT" in the last  72 hours. Urinalysis    Component Value Date/Time   COLORURINE STRAW (A) 04/10/2022 0712   APPEARANCEUR CLEAR 04/10/2022 0712   LABSPEC 1.014 04/10/2022 0712   PHURINE 6.0 04/10/2022 0712   GLUCOSEU >=500 (A) 04/10/2022 0712   GLUCOSEU NEGATIVE 02/13/2020 1150   HGBUR NEGATIVE 04/10/2022 0712   BILIRUBINUR NEGATIVE 04/10/2022 0712   KETONESUR 5 (A) 04/10/2022 0712   PROTEINUR NEGATIVE 04/10/2022 0712   UROBILINOGEN 0.2 02/13/2020 1150   NITRITE NEGATIVE 04/10/2022 0712   LEUKOCYTESUR MODERATE (A) 04/10/2022 0712   Sepsis Labs Recent Labs  Lab 01/18/24 0109 01/19/24 0335  WBC 9.4 5.1   Microbiology No results found for this or any previous visit (from the past 240 hours).   Time coordinating discharge:  32 minutes  SIGNED:   Dorcas Carrow, MD  [facility] 01/19/2024, 1:34 PM

## 2024-01-19 NOTE — Progress Notes (Signed)
   Rounding Note    Patient Name: Jordan Ramirez Date of Encounter: 01/19/2024  [facility] Cardiologist: Jodelle Red, MD   Subjective   No further chest pain/discomfort.  Inpatient Medications    Scheduled Meds:  amLODipine  10 mg Oral Daily   fluticasone  1 spray Each Nare BID   heparin  5,000 Units Subcutaneous Q12H   insulin aspart  0-15 Units Subcutaneous TID WC   ondansetron  4 mg Intravenous Once   pantoprazole (PROTONIX) IV  40 mg Intravenous Q12H   potassium chloride  20 mEq Oral BID   sodium chloride flush  3 mL Intravenous Q12H   sodium chloride flush  3 mL Intravenous Q12H   Continuous Infusions:  sodium chloride     PRN Meds: sodium chloride, acetaminophen **OR** acetaminophen, hydrALAZINE, HYDROcodone-acetaminophen, morphine injection, nitroGLYCERIN, sodium chloride flush   Vital Signs    Vitals:   01/19/24 0747 01/19/24 1148 01/19/24 1204 01/19/24 1223  BP: (!) 141/81 116/70 127/81 134/71  Pulse:      Resp: 16   19  Temp: 97.9 F (36.6 C)   98.1 F (36.7 C)  TempSrc: Oral   Oral  SpO2: 98%     Weight:      Height:        Intake/Output Summary (Last 24 hours) at 01/19/2024 1343 Last data filed at 01/19/2024 0322 Gross per 24 hour  Intake 22.38 ml  Output --  Net 22.38 ml      01/19/2024    3:30 AM 01/18/2024    2:21 PM 01/18/2024   12:56 AM  Last 3 Weights  Weight (lbs) 236 lb 5.4 oz 235 lb 235 lb  Weight (kg) 107.201 kg 106.595 kg 106.595 kg      Telemetry    sinus - Personally Reviewed  Physical Exam   GEN: No acute distress.   Neck: No JVD Cardiac: RRR, no murmurs, rubs, or gallops.  Respiratory: Clear to auscultation bilaterally. GI: Soft, nontender, non-distended  MS: No edema; No deformity. Neuro:  Nonfocal  Psych: Normal affect   New pertinent results (labs, ECG, imaging, cardiac studies)    Coronary CT--see below  Assessment & Plan    Chest pain, with CV risk factors Abnormal ECG -I personally read  her CT. There is no stenosis anywhere in the coronary tree. No calcification. I did send for plaque analysis, which may take a bit to return, but I do not anticipate high plaque burden, if any -Her LV function/wall motion appeared normal on gated imaging -She follows closely with Dr. [place] as her PCP. She would prefer PRN cardiology follow up, which I think is appropriate based on the results of her scan. Always happy to see her back as needed.  Type II diabetes -on metformin at home, restart at discharge -continue rosuvastatin for prevention, low dose reasonable given lack of any significant CAD on CT   Hypertension -continue home amlodipine-olmesartan at discharge   Nausea/vomiting Elevated LFTs -CT did not note GI abnormalities, no gallstones notes -u/s with cholelithiasis without cholecystitis -now tolerating PO -management per primary team  We would be happy to see her back as needed in the future. All questions answered.    Signed, Jodelle Red, MD  01/19/2024, 1:43 PM

## 2024-01-19 NOTE — Progress Notes (Signed)
   01/19/24 1406  TOC Brief Assessment  Insurance and Status Reviewed  Patient has primary care physician Yes  Home environment has been reviewed home  Prior level of function: independent  Prior/Current Home Services No current home services  Social Drivers of Health Review SDOH reviewed needs interventions  Readmission risk has been reviewed Yes  Transition of care needs no transition of care needs at this time    Pt stable for transition home today, no HH or DME needs noted.

## 2024-01-20 ENCOUNTER — Encounter (HOSPITAL_BASED_OUTPATIENT_CLINIC_OR_DEPARTMENT_OTHER): Payer: Self-pay

## 2024-01-20 ENCOUNTER — Ambulatory Visit (HOSPITAL_BASED_OUTPATIENT_CLINIC_OR_DEPARTMENT_OTHER): Payer: Self-pay | Admitting: Physical Therapy

## 2024-01-20 LAB — LIPOPROTEIN A (LPA): Lipoprotein (a): 188.8 nmol/L — ABNORMAL HIGH (ref <75.0)

## 2024-01-26 ENCOUNTER — Ambulatory Visit (HOSPITAL_BASED_OUTPATIENT_CLINIC_OR_DEPARTMENT_OTHER): Admitting: Physical Therapy

## 2024-02-01 ENCOUNTER — Ambulatory Visit: Admitting: Family Medicine

## 2024-02-01 ENCOUNTER — Encounter: Payer: Self-pay | Admitting: Family Medicine

## 2024-02-01 VITALS — BP 130/80 | HR 95 | Temp 98.7°F | Wt 239.2 lb

## 2024-02-01 DIAGNOSIS — I10 Essential (primary) hypertension: Secondary | ICD-10-CM | POA: Diagnosis not present

## 2024-02-01 DIAGNOSIS — L299 Pruritus, unspecified: Secondary | ICD-10-CM

## 2024-02-01 DIAGNOSIS — K8020 Calculus of gallbladder without cholecystitis without obstruction: Secondary | ICD-10-CM | POA: Diagnosis not present

## 2024-02-01 DIAGNOSIS — R7401 Elevation of levels of liver transaminase levels: Secondary | ICD-10-CM | POA: Diagnosis not present

## 2024-02-01 NOTE — Progress Notes (Signed)
 Established Patient Office Visit   Subjective  Patient ID: Jordan Ramirez, female    DOB: August 04, 1961  Age: 63 y.o. MRN: [id number]  Chief Complaint  Patient presents with   Follow-up    Wants to discuss itching since hospital discharge    Patient is a 63 year old female followed by Dr. [place] and seen for hospital follow-up.  Patient seen in ED on 4/2-4/20/2025.  Noted to have transaminitis with elevated GGT.  RUQ ultrasound with cholelithiasis.  Patient states since discharge she has had 1 episode of nausea.  Endorses pruritus of neck and chest.  Denies jaundice.  Was unsure if she actually had gallstones as does not recall being told this.    Patient Active Problem List   Diagnosis Date Noted   Chest pain 01/19/2024   Type 2 diabetes mellitus with obesity (HCC) 01/19/2024   Nonspecific chest pain 01/19/2024   Chest pressure 01/18/2024   Nausea and vomiting 01/18/2024   Transaminitis 01/18/2024   Routine general medical examination at a health care facility 07/13/2023   Peripheral polyneuropathy 05/25/2022   Chronic bilateral low back pain 05/25/2022   Mixed incontinence urge and stress 02/13/2020   Bilateral lower extremity edema 02/13/2020   Ganglion cyst of dorsum of right wrist 01/16/2018   Bilateral knee pain 04/17/2012   Pain of left heel 04/17/2012   Diabetes mellitus type 2 with neurological manifestations (HCC) 08/28/2009   THUMB PAIN, LEFT 08/27/2009   Hyperlipemia 08/20/2009   Morbid obesity (HCC)-BMI 37.4. Co morbilities HTN,DM II,HLD,OA-urine incontinence. 08/20/2009   ANEMIA-NOS 08/20/2009   Essential hypertension 08/20/2009   Allergic rhinitis 08/20/2009   Asthma 08/20/2009   MENORRHAGIA, PERIMENOPAUSAL 08/20/2009   DEGENERATIVE JOINT DISEASE, KNEE 08/20/2009   TENDINITIS, LEFT THUMB 08/20/2009   Past Medical History:  Diagnosis Date   Anemia    Anxiety    Arthritis    Asthma    Chronic low back pain    Diabetes mellitus without complication (HCC)     Fibroid    Hypertension    Neuromuscular disorder [facility]) July 2019   Treatment by Orthopedic specialist   Obesity    Routine general medical examination at a health care facility 07/13/2023   Past Surgical History:  Procedure Laterality Date   PILONIDAL CYST EXCISION     WISDOM TOOTH EXTRACTION     Social History   Tobacco Use   Smoking status: Never   Smokeless tobacco: Never  Vaping Use   Vaping status: Never Used  Substance Use Topics   Alcohol use: Not Currently   Drug use: No   Family History  Problem Relation Age of Onset   Coronary artery disease Mother    Diabetes type II Mother    Depression Mother    Hypertension Mother    Arthritis Mother    Diabetes Mother    Hearing loss Mother    Heart disease Mother    Kidney disease Mother    Obesity Mother    Stroke Mother    Kidney failure Father    Arthritis Father    Hearing loss Father    Heart disease Father    Hypertension Father    Kidney disease Father    Obesity Father    Stroke Father    Hypertension Brother    Cancer Maternal Grandmother    Arthritis Maternal Grandmother    Diabetes Maternal Grandmother    Hearing loss Maternal Grandmother    Heart disease Maternal Grandmother    Kidney disease Maternal Grandmother  Obesity Maternal Grandmother    Cancer Paternal Grandfather    No Known Allergies    ROS Negative unless stated above    Objective:     BP 130/80 (BP Location: Right Wrist, Patient Position: Sitting, Cuff Size: Normal)   Pulse 95   Temp 98.7 F (37.1 C) (Oral)   Wt 239 lb 3.2 oz (108.5 kg)   LMP 09/29/2017   SpO2 98%   BMI 37.46 kg/m  BP Readings from Last 3 Encounters:  02/01/24 130/80  01/19/24 134/71  12/28/23 (!) 140/85   Wt Readings from Last 3 Encounters:  02/01/24 239 lb 3.2 oz (108.5 kg)  01/19/24 236 lb 5.4 oz (107.2 kg)  12/28/23 232 lb (105.2 kg)    Physical Exam Constitutional:      General: She is not in acute distress.    Appearance: Normal  appearance.  HENT:     Head: Normocephalic and atraumatic.     Nose: Nose normal.     Mouth/Throat:     Mouth: Mucous membranes are moist.  Eyes:     General: No scleral icterus.    Extraocular Movements: Extraocular movements intact.     Conjunctiva/sclera: Conjunctivae normal.     Pupils: Pupils are equal, round, and reactive to light.  Cardiovascular:     Rate and Rhythm: Normal rate and regular rhythm.     Heart sounds: Normal heart sounds. No murmur heard.    No gallop.  Pulmonary:     Effort: Pulmonary effort is normal. No respiratory distress.     Breath sounds: Normal breath sounds. No wheezing, rhonchi or rales.  Abdominal:     General: Bowel sounds are normal. There is no distension.     Palpations: Abdomen is soft.     Tenderness: There is no abdominal tenderness. There is no guarding or rebound.  Skin:    General: Skin is warm and dry.     Comments: Area of hyperpigmentation, dry appearing on left lateral neck  Neurological:     Mental Status: She is alert and oriented to person, place, and time.      No results found for any visits on 02/01/24.    Assessment & Plan:  Calculus of gallbladder without cholecystitis without obstruction -     Comprehensive metabolic panel with GFR -     CBC with Differential/Platelet -     Ambulatory referral to General Surgery  Transaminitis -     Comprehensive metabolic panel with GFR -     CBC with Differential/Platelet  Pruritus -     CBC with Differential/Platelet  Essential hypertension -     Comprehensive metabolic panel with GFR  Patient seen for hospital follow-up.  Admitted 4/2-01/19/24 for observation.  Found to have transaminitis, hypokalemia, hypocalcemia, gallstones without cholecystitis.  Repeat lab work.  Referral to general surgery placed.  Given strict precautions.  Pruritus likely 2/2 transaminitis.  Also consider eczema given dry hypopigmented appearance of area on neck.  OTC 1% cortisone cream.  Return if  symptoms worsen or fail to improve.   Viola Greulich, MD

## 2024-02-02 ENCOUNTER — Encounter (HOSPITAL_BASED_OUTPATIENT_CLINIC_OR_DEPARTMENT_OTHER): Payer: Self-pay

## 2024-02-02 ENCOUNTER — Ambulatory Visit (HOSPITAL_BASED_OUTPATIENT_CLINIC_OR_DEPARTMENT_OTHER): Admitting: Physical Therapy

## 2024-02-02 LAB — COMPREHENSIVE METABOLIC PANEL WITH GFR
ALT: 49 U/L — ABNORMAL HIGH (ref 0–35)
AST: 22 U/L (ref 0–37)
Albumin: 4.3 g/dL (ref 3.5–5.2)
Alkaline Phosphatase: 91 U/L (ref 39–117)
BUN: 15 mg/dL (ref 6–23)
CO2: 27 meq/L (ref 19–32)
Calcium: 9.6 mg/dL (ref 8.4–10.5)
Chloride: 102 meq/L (ref 96–112)
Creatinine, Ser: 1.27 mg/dL — ABNORMAL HIGH (ref 0.40–1.20)
GFR: 44.85 mL/min — ABNORMAL LOW (ref >60.00)
Glucose, Bld: 88 mg/dL (ref 70–99)
Potassium: 4 meq/L (ref 3.5–5.1)
Sodium: 137 meq/L (ref 135–145)
Total Bilirubin: 0.6 mg/dL (ref 0.2–1.2)
Total Protein: 7.5 g/dL (ref 6.0–8.3)

## 2024-02-02 LAB — CBC WITH DIFFERENTIAL/PLATELET
Basophils Absolute: 0.1 10*3/uL (ref 0.0–0.1)
Basophils Relative: 1.2 % (ref 0.0–3.0)
Eosinophils Absolute: 0.1 10*3/uL (ref 0.0–0.7)
Eosinophils Relative: 2.5 % (ref 0.0–5.0)
HCT: 43.3 % (ref 36.0–46.0)
Hemoglobin: 14.4 g/dL (ref 12.0–15.0)
Lymphocytes Relative: 35 % (ref 12.0–46.0)
Lymphs Abs: 1.8 10*3/uL (ref 0.7–4.0)
MCHC: 33.3 g/dL (ref 30.0–36.0)
MCV: 91.7 fl (ref 78.0–100.0)
Monocytes Absolute: 0.5 10*3/uL (ref 0.1–1.0)
Monocytes Relative: 9.9 % (ref 3.0–12.0)
Neutro Abs: 2.6 10*3/uL (ref 1.4–7.7)
Neutrophils Relative %: 51.4 % (ref 43.0–77.0)
Platelets: 213 10*3/uL (ref 150.0–400.0)
RBC: 4.72 Mil/uL (ref 3.87–5.11)
RDW: 14 % (ref 11.5–15.5)
WBC: 5.1 10*3/uL (ref 4.0–10.5)

## 2024-02-03 ENCOUNTER — Encounter: Payer: Self-pay | Admitting: Family Medicine

## 2024-02-06 ENCOUNTER — Telehealth: Payer: Self-pay

## 2024-02-06 NOTE — Telephone Encounter (Signed)
 Copied from CRM [id number]. Topic: General - Other >> Feb 02, 2024 11:23 AM Bambi Bonine D wrote: Reason for CRM: Patient stated that she has a few questions she would like to get over to Dr.Jordan. Patient stated that she does PT care treatment and wanted to make sure it was ok to resume treatment and also work out. Patient also stated that on her after visit summary it doesn't specify if she has silent gallstones or if she needs her gallbladder removed. Lastly the patient stated that she saw Dr.Banks yesterday and Dr.Banks suggested that she get cortizone cream for the itching. Patient wants to know what kind of cortizone cream she needs to get.

## 2024-02-07 NOTE — Telephone Encounter (Signed)
 I called pt and left her a voicemail, let her know answers would be sent via mychart & to call back with any questions.

## 2024-02-07 NOTE — Telephone Encounter (Signed)
 I do not see any contraindication to resume PT. Thanks, BJ

## 2024-02-08 ENCOUNTER — Ambulatory Visit (HOSPITAL_BASED_OUTPATIENT_CLINIC_OR_DEPARTMENT_OTHER): Payer: Self-pay | Admitting: Physical Therapy

## 2024-02-08 ENCOUNTER — Encounter (HOSPITAL_BASED_OUTPATIENT_CLINIC_OR_DEPARTMENT_OTHER): Payer: Self-pay

## 2024-02-17 ENCOUNTER — Other Ambulatory Visit: Payer: Self-pay | Admitting: Family Medicine

## 2024-02-17 DIAGNOSIS — J3089 Other allergic rhinitis: Secondary | ICD-10-CM

## 2024-02-23 ENCOUNTER — Ambulatory Visit (HOSPITAL_BASED_OUTPATIENT_CLINIC_OR_DEPARTMENT_OTHER): Attending: Family Medicine | Admitting: Physical Therapy

## 2024-02-23 DIAGNOSIS — M6281 Muscle weakness (generalized): Secondary | ICD-10-CM | POA: Insufficient documentation

## 2024-02-23 DIAGNOSIS — M5459 Other low back pain: Secondary | ICD-10-CM | POA: Insufficient documentation

## 2024-02-23 DIAGNOSIS — R2689 Other abnormalities of gait and mobility: Secondary | ICD-10-CM | POA: Insufficient documentation

## 2024-02-23 NOTE — Therapy (Signed)
 OUTPATIENT PHYSICAL THERAPY THORACOLUMBAR TREATMENT /  PROGRESS NOTE   Patient Name: Jordan Ramirez MRN: [id number] DOB:03-02-1961, 63 y.o., female Today's Date: 02/24/2024  END OF SESSION:  PT End of Session - 02/23/24 1158     Visit Number 5    Number of Visits 12    Date for PT Re-Evaluation 04/05/24    Authorization Type [place] MEDICAID PREPAID HEALTH PLAN    PT Start Time 1115    PT Stop Time 1155    PT Time Calculation (min) 40 min    Activity Tolerance Patient tolerated treatment well    Behavior During Therapy [facility] for tasks assessed/performed               Past Medical History:  Diagnosis Date   Anemia    Anxiety    Arthritis    Asthma    Chronic low back pain    Diabetes mellitus without complication (HCC)    Fibroid    Hypertension    Neuromuscular disorder [facility]) July 2019   Treatment by Orthopedic specialist   Obesity    Routine general medical examination at a health care facility 07/13/2023   Past Surgical History:  Procedure Laterality Date   PILONIDAL CYST EXCISION     WISDOM TOOTH EXTRACTION     Patient Active Problem List   Diagnosis Date Noted   Chest pain 01/19/2024   Type 2 diabetes mellitus with obesity (HCC) 01/19/2024   Nonspecific chest pain 01/19/2024   Chest pressure 01/18/2024   Nausea and vomiting 01/18/2024   Transaminitis 01/18/2024   Routine general medical examination at a health care facility 07/13/2023   Peripheral polyneuropathy 05/25/2022   Chronic bilateral low back pain 05/25/2022   Mixed incontinence urge and stress 02/13/2020   Bilateral lower extremity edema 02/13/2020   Ganglion cyst of dorsum of right wrist 01/16/2018   Bilateral knee pain 04/17/2012   Pain of left heel 04/17/2012   Diabetes mellitus type 2 with neurological manifestations (HCC) 08/28/2009   THUMB PAIN, LEFT 08/27/2009   Hyperlipemia 08/20/2009   Morbid obesity (HCC)-BMI 37.4. Co morbilities HTN,DM II,HLD,OA-urine incontinence. 08/20/2009    ANEMIA-NOS 08/20/2009   Essential hypertension 08/20/2009   Allergic rhinitis 08/20/2009   Asthma 08/20/2009   MENORRHAGIA, PERIMENOPAUSAL 08/20/2009   DEGENERATIVE JOINT DISEASE, KNEE 08/20/2009   TENDINITIS, LEFT THUMB 08/20/2009    PCP: [place], Betty G, MD   REFERRING PROVIDER: [place], Betty G, MD   REFERRING DIAG:  Chronic right-sided low back pain, unspecified whether sciatica present [M54.50, G89.29]   Rationale for Evaluation and Treatment: Rehabilitation  THERAPY DIAG:  Other low back pain  Other abnormalities of gait and mobility  Muscle weakness (generalized)  ONSET DATE: 2019 started with intermittent flare up's since.   SUBJECTIVE:  SUBJECTIVE STATEMENT: Pt had chest pain and abdominal pain and was admitted to hospital on 4/2.  Pt was found to have issues with her gallbladder and is planning on seeing MD in June.  Pt had a follow up on 4/16 at her primary care office.  Pt spoke with PCP and was cleared to return to PT.     Pt occasionally has pain in groin and pain down in L LE to her foot.  Pt feels that with standing some including cooking in the kitchen.   Pt reports 30-40% improvement in pain and sx's overall.  Pt reports improved mobility.  She is walking more which is helping greatly.  She reports compliance with HEP.   Pt reports difficulty with ascending stairs.  Pt has increased pain with prolonged standing.  She wants to be able to stand for longer duration.    PERTINENT HISTORY:  Arthritis, DM II, HTN  PAIN:    PRECAUTIONS: None  RED FLAGS: None   WEIGHT BEARING RESTRICTIONS: No  FALLS:  Has patient fallen in last 6 months? No  LIVING ENVIRONMENT: Lives with: lives alone Lives in: House/apartment Stairs: Yes: Internal: 1 flight steps; on right going up and  External: 2-3 steps; on right going up Has following equipment at home: None  OCCUPATION: Unemployed   PLOF: Independent  PATIENT GOALS: Pt would like to be able to move without continued pain.   NEXT MD VISIT: None scheduled to date.   OBJECTIVE:  Note: Objective measures were completed at Evaluation unless otherwise noted.  DIAGNOSTIC FINDINGS:  1. Progressive degenerative disc disease and facet hypertrophy in the lower lumbar spine. 2. Grade 1 anterolisthesis of L4 on L5, slightly increased from 2019. 3. Degenerative change of the sacroiliac joints, also progressed.  PATIENT SURVEYS:  Modified Oswestry Initial/current:  18/50 36% ; 13= 26%    COGNITION: Overall cognitive status: Within functional limits for tasks assessed      LOWER EXTREMITY ROM:   ALL OTHER ROM WFL.   Active  Right eval Left eval Right/Left 5/8  Hip flexion     Hip extension     Hip abduction     Hip adduction     Hip internal rotation     Hip external rotation     Knee flexion 87 seated 90 seated 87/99  Knee extension      (Blank rows = not tested)  LOWER EXTREMITY MMT:    MMT Right eval Left eval Right 5/8 Left 5/8  Hip flexion 4 3- P! 4+/5 4/5  Hip extension -8 lacking - 8 lacking     Hip abduction      Hip adduction      Knee flexion 4 4 4/5 4+/5  Knee extension 4- 3+ P! 4/5 5/5   (Blank rows = not tested)   FUNCTIONAL TESTS:  5 times sit to stand:   Initial/Current:  16.97 sec/ 15.8 sec   GAIT: Distance walked: 74ft  Assistive device utilized: None Level of assistance: Complete Independence Comments: Pt has a flexed posture and slight trunk lean to the R side. She lacks terminal knee extension.   Reviewed HEP.  Supine lumbar rotation x 10 reps Supine bridge with TrA 2x10 Supine heel slides with TrA x10 bilat  PATIENT EDUCATION:  Education  details: reviewed HEP; objective findings, POC, exercise form, goal progress.  Person educated: Patient Education method: Medical illustrator Education comprehension: verbalized understanding and returned demonstration  HOME EXERCISE PROGRAM: Access Code: [email or web address] URL: [email or web address]   ASSESSMENT:  CLINICAL IMPRESSION:  Pt was last seen in PT on 4/1 and has received 4 PT visits prior to today.  She returns to PT after being admitted to hospital on 4/2 with issues with her gallbaldder.  Pt has received clearance to return to PT.  Pt reports 30-40% improvement in pain and sx's overall.  Pt reports improved mobility and she is walking more.  Pt reports difficulty with ascending stairs.  She has increased pain with prolonged standing including while trying to cook.  Pt occasionally has pain in groin and pain down in L LE to her foot.  Pt continues to have limited knee ROM bilat though demonstrates improved L knee flexion ROM.  Pt demonstrates improved self perceived disability with Modified Oswestry improving by 10% (clinical significant improvement = 12%).  Pt demonstrates improved strength in bilat hip flexion, L knee flexion, and bilat knee extension.  Pt's 5x STS test improved by 1 sec.  Pt met STG #2 and partially met LTG #7.  Pt should benefit from cont skilled PT with a combination of land based and aquatic PT to address impairments and goals and to improve overall function.     OBJECTIVE IMPAIRMENTS: decreased activity tolerance, difficulty walking, decreased balance, decreased endurance, decreased mobility, decreased ROM, decreased strength, impaired flexibility, impaired UE/LE use, postural dysfunction, and pain.  ACTIVITY LIMITATIONS: bending, lifting, carry, locomotion, cleaning, community activity, driving, and or occupation  PERSONAL FACTORS:  Arthritis, DM II, HTN are also affecting patient's functional outcome.  REHAB POTENTIAL: Good  CLINICAL  DECISION MAKING: Stable/uncomplicated  EVALUATION COMPLEXITY: Low    GOALS: Short term PT Goals Target date: 01/09/2024 Pt will be I and compliant with HEP. Baseline:  Goal status: ONGOING Pt will decrease pain by 25% overall Baseline: Goal status: GOAL MET 5/8  Long term PT goals Target date: 04/05/2024 Pt will improve ROM to [facility] to improve functional mobility Baseline: Goal status: New Pt will improve  hip/knee strength to at least 4+/5 MMT to improve functional strength Baseline: Goal status: PROGRESSING  5/8 Pt will improve OSWESTRY to at least 11% functional to show improved function Baseline: Goal status: PROGRESSING  5/8 Pt will reduce pain by overall 50% overall with usual activity Baseline: Goal status: PROGRESSING  5/8 Pt will be able to negotiate stairs with a reciprocal gait pattern with use of 1x hand rail if needed. Baseline: Goal status: Not assessed   Pt will be able to ambulate community distances at least 1000 ft WNL gait pattern without complaints Baseline: Goal status: ONGOING     7. Pt will improve her STS by 2.3 seconds for Minimal clinically important difference.         Baseline:          Goal status: 50% met  5/8     8.  Pt will report at least a 50% improvement in standing tolerance.     Goal status:  INITIAL  PLAN: PT FREQUENCY: 1-2times per week   PT DURATION: 6-8 weeks  PLANNED INTERVENTIONS (unless contraindicated): aquatic PT, Canalith repositioning, cryotherapy, Electrical stimulation, Iontophoresis with 4 mg/ml dexamethasome, Moist heat, traction, Ultrasound, gait training, Therapeutic exercise, balance training, neuromuscular re-education, patient/family education, prosthetic training, manual techniques, passive ROM, dry needling, taping, vasopnuematic device,  vestibular, spinal manipulations, joint manipulations  Trina Fujita III PT, DPT 02/24/24 4:02 PM      PLAN FOR NEXT SESSION: Assess HEP/update PRN, continue to progress  functional mobility, strengthen proximal hip muscles and core. Decrease patients pain.   For all possible CPT codes, reference the Planned Interventions line above.     Check all conditions that are expected to impact treatment: {Conditions expected to impact treatment:Morbid obesity, Musculoskeletal disorders, and Structural or anatomic abnormalities   If treatment provided at initial evaluation, no treatment charged due to lack of authorization.

## 2024-02-24 ENCOUNTER — Encounter (HOSPITAL_BASED_OUTPATIENT_CLINIC_OR_DEPARTMENT_OTHER): Payer: Self-pay | Admitting: Physical Therapy

## 2024-02-29 ENCOUNTER — Ambulatory Visit (HOSPITAL_BASED_OUTPATIENT_CLINIC_OR_DEPARTMENT_OTHER): Payer: Self-pay | Admitting: Physical Therapy

## 2024-03-15 ENCOUNTER — Encounter (HOSPITAL_BASED_OUTPATIENT_CLINIC_OR_DEPARTMENT_OTHER)

## 2024-03-20 ENCOUNTER — Ambulatory Visit (HOSPITAL_BASED_OUTPATIENT_CLINIC_OR_DEPARTMENT_OTHER): Payer: Self-pay | Attending: Family Medicine | Admitting: Physical Therapy

## 2024-03-20 DIAGNOSIS — M25562 Pain in left knee: Secondary | ICD-10-CM | POA: Insufficient documentation

## 2024-03-20 DIAGNOSIS — M5459 Other low back pain: Secondary | ICD-10-CM | POA: Insufficient documentation

## 2024-03-20 DIAGNOSIS — M25561 Pain in right knee: Secondary | ICD-10-CM | POA: Insufficient documentation

## 2024-03-20 DIAGNOSIS — R2689 Other abnormalities of gait and mobility: Secondary | ICD-10-CM | POA: Diagnosis present

## 2024-03-20 DIAGNOSIS — G8929 Other chronic pain: Secondary | ICD-10-CM | POA: Diagnosis present

## 2024-03-20 DIAGNOSIS — M6281 Muscle weakness (generalized): Secondary | ICD-10-CM | POA: Insufficient documentation

## 2024-03-20 NOTE — Therapy (Signed)
 OUTPATIENT PHYSICAL THERAPY THORACOLUMBAR TREATMENT /  PROGRESS NOTE   Patient Name: Jordan Ramirez MRN: [id number] DOB:Jan 07, 1961, 63 y.o., female Today's Date: 03/20/2024  END OF SESSION:      Past Medical History:  Diagnosis Date   Anemia    Anxiety    Arthritis    Asthma    Chronic low back pain    Diabetes mellitus without complication (HCC)    Fibroid    Hypertension    Neuromuscular disorder [facility]) July 2019   Treatment by Orthopedic specialist   Obesity    Routine general medical examination at a health care facility 07/13/2023   Past Surgical History:  Procedure Laterality Date   PILONIDAL CYST EXCISION     WISDOM TOOTH EXTRACTION     Patient Active Problem List   Diagnosis Date Noted   Chest pain 01/19/2024   Type 2 diabetes mellitus with obesity (HCC) 01/19/2024   Nonspecific chest pain 01/19/2024   Chest pressure 01/18/2024   Nausea and vomiting 01/18/2024   Transaminitis 01/18/2024   Routine general medical examination at a health care facility 07/13/2023   Peripheral polyneuropathy 05/25/2022   Chronic bilateral low back pain 05/25/2022   Mixed incontinence urge and stress 02/13/2020   Bilateral lower extremity edema 02/13/2020   Ganglion cyst of dorsum of right wrist 01/16/2018   Bilateral knee pain 04/17/2012   Pain of left heel 04/17/2012   Diabetes mellitus type 2 with neurological manifestations (HCC) 08/28/2009   THUMB PAIN, LEFT 08/27/2009   Hyperlipemia 08/20/2009   Morbid obesity (HCC)-BMI 37.4. Co morbilities HTN,DM II,HLD,OA-urine incontinence. 08/20/2009   ANEMIA-NOS 08/20/2009   Essential hypertension 08/20/2009   Allergic rhinitis 08/20/2009   Asthma 08/20/2009   MENORRHAGIA, PERIMENOPAUSAL 08/20/2009   DEGENERATIVE JOINT DISEASE, KNEE 08/20/2009   TENDINITIS, LEFT THUMB 08/20/2009    PCP: [place], Betty G, MD   REFERRING PROVIDER: [place], Betty G, MD   REFERRING DIAG:  Chronic right-sided low back pain, unspecified whether  sciatica present [M54.50, G89.29]   Rationale for Evaluation and Treatment: Rehabilitation  THERAPY DIAG:  No diagnosis found.  ONSET DATE: 2019 started with intermittent flare up's since.   SUBJECTIVE:                                                                                                                                                                                           SUBJECTIVE STATEMENT: Pt denies any adverse effects after prior Rx.  Pt denies pain currently.  She has had no chest pain and abdominal pain since before last Rx.  Pt states she occasionally has numbness down entire L LE to her great toe.  Pt reports improved mobility.  She reports compliance with HEP.   Pt reports some difficulty with ascending stairs.  Pt has increased pain with prolonged standing, though is able to stand longer.     PERTINENT HISTORY:  Grade 1 anterolisthesis Arthritis, DM II, HTN  PAIN:    PRECAUTIONS: None  RED FLAGS: None   WEIGHT BEARING RESTRICTIONS: No  FALLS:  Has patient fallen in last 6 months? No  LIVING ENVIRONMENT: Lives with: lives alone Lives in: House/apartment Stairs: Yes: Internal: 1 flight steps; on right going up and External: 2-3 steps; on right going up Has following equipment at home: None  OCCUPATION: Unemployed   PLOF: Independent  PATIENT GOALS: Pt would like to be able to move without continued pain.   NEXT MD VISIT: None scheduled to date.   OBJECTIVE:  Note: Objective measures were completed at Evaluation unless otherwise noted.  DIAGNOSTIC FINDINGS:  1. Progressive degenerative disc disease and facet hypertrophy in the lower lumbar spine. 2. Grade 1 anterolisthesis of L4 on L5, slightly increased from 2019. 3. Degenerative change of the sacroiliac joints, also progressed.  PATIENT SURVEYS:  Modified Oswestry Initial/current:  18/50 36% ; 13= 26%    COGNITION: Overall cognitive status: Within functional limits for tasks  assessed      LOWER EXTREMITY ROM:   ALL OTHER ROM WFL.   Active  Right eval Left eval Right/Left 5/8  Hip flexion     Hip extension     Hip abduction     Hip adduction     Hip internal rotation     Hip external rotation     Knee flexion 87 seated 90 seated 87/99  Knee extension      (Blank rows = not tested)  LOWER EXTREMITY MMT:    MMT Right eval Left eval Right 5/8 Left 5/8  Hip flexion 4 3- P! 4+/5 4/5  Hip extension -8 lacking - 8 lacking     Hip abduction      Hip adduction      Knee flexion 4 4 4/5 4+/5  Knee extension 4- 3+ P! 4/5 5/5   (Blank rows = not tested)   FUNCTIONAL TESTS:  5 times sit to stand:   Initial/Current:  16.97 sec/ 15.8 sec   GAIT: Distance walked: 38ft  Assistive device utilized: None Level of assistance: Complete Independence Comments: Pt has a flexed posture and slight trunk lean to the R side. She lacks terminal knee extension.   Reviewed HEP.  Nustep L4-5 x 5 mins Supine lumbar rotation x 10 reps Supine bridge with TrA 2x10 Supine heel slides with TrA x10 bilat Supine PPT 3x10 Supine manual HS stretch 2x30 sec bilat Supine shoulder flex/ext with ball 2x10 with TrA Supine clams with RTB x 15, GTB x 10 Sit to stands from table TrA 2x5 with TrA  PATIENT EDUCATION:  Education details: reviewed HEP; objective findings, POC, exercise form, goal progress.  Person educated: Patient Education method: Medical illustrator Education comprehension: verbalized understanding and returned demonstration  HOME EXERCISE PROGRAM: Access Code: [email or web address] URL: [email or web address]   ASSESSMENT:  CLINICAL IMPRESSION:  Pt was last seen in PT on 4/1 and has received 4 PT visits prior to today.  She returns to PT after being admitted to hospital on 4/2 with issues with her gallbaldder.  Pt has  received clearance to return to PT.  Pt reports 30-40% improvement in pain and sx's overall.  Pt reports improved mobility and she is walking more.  Pt reports difficulty with ascending stairs.  She has increased pain with prolonged standing including while trying to cook.  Pt occasionally has pain in groin and pain down in L LE to her foot.  Pt continues to have limited knee ROM bilat though demonstrates improved L knee flexion ROM.  Pt demonstrates improved self perceived disability with Modified Oswestry improving by 10% (clinical significant improvement = 12%).  Pt demonstrates improved strength in bilat hip flexion, L knee flexion, and bilat knee extension.  Pt's 5x STS test improved by 1 sec.  Pt met STG #2 and partially met LTG #7.  Pt should benefit from cont skilled PT with a combination of land based and aquatic PT to address impairments and goals and to improve overall function.     OBJECTIVE IMPAIRMENTS: decreased activity tolerance, difficulty walking, decreased balance, decreased endurance, decreased mobility, decreased ROM, decreased strength, impaired flexibility, impaired UE/LE use, postural dysfunction, and pain.  ACTIVITY LIMITATIONS: bending, lifting, carry, locomotion, cleaning, community activity, driving, and or occupation  PERSONAL FACTORS:  Arthritis, DM II, HTN are also affecting patient's functional outcome.  REHAB POTENTIAL: Good  CLINICAL DECISION MAKING: Stable/uncomplicated  EVALUATION COMPLEXITY: Low    GOALS: Short term PT Goals Target date: 01/09/2024 Pt will be I and compliant with HEP. Baseline:  Goal status: ONGOING Pt will decrease pain by 25% overall Baseline: Goal status: GOAL MET 5/8  Long term PT goals Target date: 04/05/2024 Pt will improve ROM to [facility] to improve functional mobility Baseline: Goal status: New Pt will improve  hip/knee strength to at least 4+/5 MMT to improve functional strength Baseline: Goal status: PROGRESSING  5/8 Pt  will improve OSWESTRY to at least 11% functional to show improved function Baseline: Goal status: PROGRESSING  5/8 Pt will reduce pain by overall 50% overall with usual activity Baseline: Goal status: PROGRESSING  5/8 Pt will be able to negotiate stairs with a reciprocal gait pattern with use of 1x hand rail if needed. Baseline: Goal status: Not assessed   Pt will be able to ambulate community distances at least 1000 ft WNL gait pattern without complaints Baseline: Goal status: ONGOING     7. Pt will improve her STS by 2.3 seconds for Minimal clinically important difference.         Baseline:          Goal status: 50% met  5/8     8.  Pt will report at least a 50% improvement in standing tolerance.     Goal status:  INITIAL  PLAN: PT FREQUENCY: 1-2times per week   PT DURATION: 6-8 weeks  PLANNED INTERVENTIONS (unless contraindicated): aquatic PT, Canalith repositioning, cryotherapy, Electrical stimulation, Iontophoresis with 4 mg/ml dexamethasome, Moist heat, traction, Ultrasound, gait training, Therapeutic exercise, balance training, neuromuscular re-education, patient/family education, prosthetic training, manual techniques, passive ROM, dry needling, taping, vasopnuematic device,  vestibular, spinal manipulations, joint manipulations  Trina Fujita III PT, DPT 03/20/24 3:48 PM      PLAN FOR NEXT SESSION: Assess HEP/update PRN, continue to progress functional mobility, strengthen proximal hip muscles and core. Decrease patients pain.   For all possible CPT codes, reference the Planned Interventions line above.     Check all conditions that are expected to impact treatment: {Conditions expected to impact treatment:Morbid obesity, Musculoskeletal disorders, and Structural or anatomic abnormalities   If treatment provided at initial evaluation, no treatment charged due to lack of authorization.

## 2024-03-21 ENCOUNTER — Encounter (HOSPITAL_BASED_OUTPATIENT_CLINIC_OR_DEPARTMENT_OTHER): Payer: Self-pay | Admitting: Physical Therapy

## 2024-03-27 ENCOUNTER — Encounter (HOSPITAL_BASED_OUTPATIENT_CLINIC_OR_DEPARTMENT_OTHER): Payer: Self-pay

## 2024-03-29 ENCOUNTER — Other Ambulatory Visit (HOSPITAL_COMMUNITY): Payer: Self-pay

## 2024-03-29 ENCOUNTER — Telehealth: Payer: Self-pay

## 2024-03-29 NOTE — Telephone Encounter (Signed)
 Pharmacy Patient Advocate Encounter   Received notification from Patient Pharmacy that prior authorization for Dexcom G6 sensor is required/requested.   Insurance verification completed.   The patient is insured through [facility] .   Per test claim: PA required; PA submitted to above mentioned insurance via CoverMyMeds Key/confirmation #/EOC [facility] Status is pending

## 2024-03-30 ENCOUNTER — Ambulatory Visit (HOSPITAL_BASED_OUTPATIENT_CLINIC_OR_DEPARTMENT_OTHER)

## 2024-03-30 ENCOUNTER — Other Ambulatory Visit (HOSPITAL_COMMUNITY): Payer: Self-pay

## 2024-03-30 ENCOUNTER — Encounter (HOSPITAL_BASED_OUTPATIENT_CLINIC_OR_DEPARTMENT_OTHER): Payer: Self-pay

## 2024-03-30 DIAGNOSIS — R2689 Other abnormalities of gait and mobility: Secondary | ICD-10-CM

## 2024-03-30 DIAGNOSIS — M5459 Other low back pain: Secondary | ICD-10-CM | POA: Diagnosis not present

## 2024-03-30 DIAGNOSIS — M6281 Muscle weakness (generalized): Secondary | ICD-10-CM

## 2024-03-30 NOTE — Therapy (Signed)
 OUTPATIENT PHYSICAL THERAPY THORACOLUMBAR TREATMENT    Patient Name: Jordan Ramirez MRN: [id number] DOB:19-Feb-1961, 63 y.o., female Today's Date: 03/30/2024  END OF SESSION:  PT End of Session - 03/30/24 1305     Visit Number 7    Number of Visits 12    Date for PT Re-Evaluation 04/05/24    Authorization Type [place] MEDICAID PREPAID HEALTH PLAN    PT Start Time 1303    PT Stop Time 1344    PT Time Calculation (min) 41 min    Activity Tolerance Patient tolerated treatment well    Behavior During Therapy [facility] for tasks assessed/performed              Past Medical History:  Diagnosis Date   Anemia    Anxiety    Arthritis    Asthma    Chronic low back pain    Diabetes mellitus without complication (HCC)    Fibroid    Hypertension    Neuromuscular disorder [facility]) July 2019   Treatment by Orthopedic specialist   Obesity    Routine general medical examination at a health care facility 07/13/2023   Past Surgical History:  Procedure Laterality Date   PILONIDAL CYST EXCISION     WISDOM TOOTH EXTRACTION     Patient Active Problem List   Diagnosis Date Noted   Chest pain 01/19/2024   Type 2 diabetes mellitus with obesity (HCC) 01/19/2024   Nonspecific chest pain 01/19/2024   Chest pressure 01/18/2024   Nausea and vomiting 01/18/2024   Transaminitis 01/18/2024   Routine general medical examination at a health care facility 07/13/2023   Peripheral polyneuropathy 05/25/2022   Chronic bilateral low back pain 05/25/2022   Mixed incontinence urge and stress 02/13/2020   Bilateral lower extremity edema 02/13/2020   Ganglion cyst of dorsum of right wrist 01/16/2018   Bilateral knee pain 04/17/2012   Pain of left heel 04/17/2012   Diabetes mellitus type 2 with neurological manifestations (HCC) 08/28/2009   THUMB PAIN, LEFT 08/27/2009   Hyperlipemia 08/20/2009   Morbid obesity (HCC)-BMI 37.4. Co morbilities HTN,DM II,HLD,OA-urine incontinence. 08/20/2009   ANEMIA-NOS 08/20/2009    Essential hypertension 08/20/2009   Allergic rhinitis 08/20/2009   Asthma 08/20/2009   MENORRHAGIA, PERIMENOPAUSAL 08/20/2009   DEGENERATIVE JOINT DISEASE, KNEE 08/20/2009   TENDINITIS, LEFT THUMB 08/20/2009    PCP: [place], Betty G, MD   REFERRING PROVIDER: [place], Betty G, MD   REFERRING DIAG:  Chronic right-sided low back pain, unspecified whether sciatica present [M54.50, G89.29]   Rationale for Evaluation and Treatment: Rehabilitation  THERAPY DIAG:  Muscle weakness (generalized)  Other abnormalities of gait and mobility  Other low back pain  ONSET DATE: 2019 started with intermittent flare up's since.   SUBJECTIVE:  SUBJECTIVE STATEMENT:  A little numbness in big toe. It never feels regular anymore.  PERTINENT HISTORY:  Grade 1 anterolisthesis Arthritis, DM II, HTN  PAIN:    PRECAUTIONS: None  RED FLAGS: None   WEIGHT BEARING RESTRICTIONS: No  FALLS:  Has patient fallen in last 6 months? No  LIVING ENVIRONMENT: Lives with: lives alone Lives in: House/apartment Stairs: Yes: Internal: 1 flight steps; on right going up and External: 2-3 steps; on right going up Has following equipment at home: None  OCCUPATION: Unemployed   PLOF: Independent  PATIENT GOALS: Pt would like to be able to move without continued pain.   NEXT MD VISIT: None scheduled to date.   OBJECTIVE:  Note: Objective measures were completed at Evaluation unless otherwise noted.  DIAGNOSTIC FINDINGS:  1. Progressive degenerative disc disease and facet hypertrophy in the lower lumbar spine. 2. Grade 1 anterolisthesis of L4 on L5, slightly increased from 2019. 3. Degenerative change of the sacroiliac joints, also progressed.  Treatment:  Nustep L4-5 x 5 mins (seat 10) Supine lumbar rotation  x 10  Supine PPT 2x10 Side steping with RTB at ankles x2laps at rail Hip flexor stretch standing 20sec x1ea Incline stretch 30sec x3 Seated lumbar flexion stretch 3x10sec PPT against wall Sit to stands from table TrA 2x10 HEP update                                                                                                                        PATIENT EDUCATION:  Education details: reviewed HEP; objective findings, POC, exercise form, goal progress.  Person educated: Patient Education method: Medical illustrator Education comprehension: verbalized understanding and returned demonstration  HOME EXERCISE PROGRAM: Access Code: [email or web address] URL: [email or web address]   ASSESSMENT:  CLINICAL IMPRESSION:  Pt reports benefit from nu-step machine. Would like to get back to gym program at [employer]. Worked on core stabilization and strengthening with good tolerance. Did experience some L HS cramping with PPT. Good understanding of proper TA activation. Pt with slight knee and hip flexion with anterior pelvic tilt in standing posture. Added hip flexor stretch to help improve this. Felt good core activation with PPT against wall.  Updated HEP. Felt improvement in great toe sx by end of session.    OBJECTIVE IMPAIRMENTS: decreased activity tolerance, difficulty walking, decreased balance, decreased endurance, decreased mobility, decreased ROM, decreased strength, impaired flexibility, impaired UE/LE use, postural dysfunction, and pain.  ACTIVITY LIMITATIONS: bending, lifting, carry, locomotion, cleaning, community activity, driving, and or occupation  PERSONAL FACTORS:  Arthritis, DM II, HTN are also affecting patient's functional outcome.  REHAB POTENTIAL: Good  CLINICAL DECISION MAKING: Stable/uncomplicated  EVALUATION COMPLEXITY: Low    GOALS: Short term PT Goals Target date: 01/09/2024 Pt will be I and compliant with HEP. Baseline:  Goal status:  ONGOING Pt will decrease pain by 25% overall Baseline: Goal status: GOAL MET 5/8  Long term PT goals Target date: 04/05/2024 Pt will improve ROM to [facility] to improve functional mobility Baseline: Goal  status: New Pt will improve  hip/knee strength to at least 4+/5 MMT to improve functional strength Baseline: Goal status: PROGRESSING  5/8 Pt will improve OSWESTRY to at least 11% functional to show improved function Baseline: Goal status: PROGRESSING  5/8 Pt will reduce pain by overall 50% overall with usual activity Baseline: Goal status: PROGRESSING  5/8 Pt will be able to negotiate stairs with a reciprocal gait pattern with use of 1x hand rail if needed. Baseline: Goal status: Not assessed   Pt will be able to ambulate community distances at least 1000 ft WNL gait pattern without complaints Baseline: Goal status: ONGOING     7. Pt will improve her STS by 2.3 seconds for Minimal clinically important difference.         Baseline:          Goal status: 50% met  5/8     8.  Pt will report at least a 50% improvement in standing tolerance.     Goal status:  INITIAL  PLAN: PT FREQUENCY: 1-2times per week   PT DURATION: 6-8 weeks  PLANNED INTERVENTIONS (unless contraindicated): aquatic PT, Canalith repositioning, cryotherapy, Electrical stimulation, Iontophoresis with 4 mg/ml dexamethasome, Moist heat, traction, Ultrasound, gait training, Therapeutic exercise, balance training, neuromuscular re-education, patient/family education, prosthetic training, manual techniques, passive ROM, dry needling, taping, vasopnuematic device, vestibular, spinal manipulations, joint manipulations   PLAN FOR NEXT SESSION: Assess HEP/update PRN, continue to progress functional mobility, strengthen proximal hip muscles and core.   Herb Loges, PTA  03/30/24 3:14 PM

## 2024-04-03 ENCOUNTER — Encounter (HOSPITAL_BASED_OUTPATIENT_CLINIC_OR_DEPARTMENT_OTHER): Admitting: Physical Therapy

## 2024-04-04 ENCOUNTER — Ambulatory Visit (HOSPITAL_BASED_OUTPATIENT_CLINIC_OR_DEPARTMENT_OTHER): Payer: Self-pay

## 2024-04-04 ENCOUNTER — Other Ambulatory Visit (HOSPITAL_COMMUNITY): Payer: Self-pay

## 2024-04-04 ENCOUNTER — Encounter (HOSPITAL_BASED_OUTPATIENT_CLINIC_OR_DEPARTMENT_OTHER): Payer: Self-pay

## 2024-04-04 DIAGNOSIS — M5459 Other low back pain: Secondary | ICD-10-CM | POA: Diagnosis not present

## 2024-04-04 DIAGNOSIS — R2689 Other abnormalities of gait and mobility: Secondary | ICD-10-CM

## 2024-04-04 DIAGNOSIS — M6281 Muscle weakness (generalized): Secondary | ICD-10-CM

## 2024-04-04 NOTE — Therapy (Signed)
 OUTPATIENT PHYSICAL THERAPY THORACOLUMBAR TREATMENT    Patient Name: Jordan Ramirez MRN: [id number] DOB:06/05/1961, 63 y.o., female Today's Date: 04/04/2024  END OF SESSION:  PT End of Session - 04/04/24 1021     Visit Number 8    Number of Visits 12    Date for PT Re-Evaluation 04/05/24    Authorization Type [place] MEDICAID PREPAID HEALTH PLAN    PT Start Time 1017    PT Stop Time 1100    PT Time Calculation (min) 43 min    Activity Tolerance Patient tolerated treatment well    Behavior During Therapy [facility] for tasks assessed/performed               Past Medical History:  Diagnosis Date   Anemia    Anxiety    Arthritis    Asthma    Chronic low back pain    Diabetes mellitus without complication (HCC)    Fibroid    Hypertension    Neuromuscular disorder [facility]) July 2019   Treatment by Orthopedic specialist   Obesity    Routine general medical examination at a health care facility 07/13/2023   Past Surgical History:  Procedure Laterality Date   PILONIDAL CYST EXCISION     WISDOM TOOTH EXTRACTION     Patient Active Problem List   Diagnosis Date Noted   Chest pain 01/19/2024   Type 2 diabetes mellitus with obesity (HCC) 01/19/2024   Nonspecific chest pain 01/19/2024   Chest pressure 01/18/2024   Nausea and vomiting 01/18/2024   Transaminitis 01/18/2024   Routine general medical examination at a health care facility 07/13/2023   Peripheral polyneuropathy 05/25/2022   Chronic bilateral low back pain 05/25/2022   Mixed incontinence urge and stress 02/13/2020   Bilateral lower extremity edema 02/13/2020   Ganglion cyst of dorsum of right wrist 01/16/2018   Bilateral knee pain 04/17/2012   Pain of left heel 04/17/2012   Diabetes mellitus type 2 with neurological manifestations (HCC) 08/28/2009   THUMB PAIN, LEFT 08/27/2009   Hyperlipemia 08/20/2009   Morbid obesity (HCC)-BMI 37.4. Co morbilities HTN,DM II,HLD,OA-urine incontinence. 08/20/2009   ANEMIA-NOS  08/20/2009   Essential hypertension 08/20/2009   Allergic rhinitis 08/20/2009   Asthma 08/20/2009   MENORRHAGIA, PERIMENOPAUSAL 08/20/2009   DEGENERATIVE JOINT DISEASE, KNEE 08/20/2009   TENDINITIS, LEFT THUMB 08/20/2009    PCP: [place], Betty G, MD   REFERRING PROVIDER: [place], Betty G, MD   REFERRING DIAG:  Chronic right-sided low back pain, unspecified whether sciatica present [M54.50, G89.29]   Rationale for Evaluation and Treatment: Rehabilitation  THERAPY DIAG:  Muscle weakness (generalized)  Other abnormalities of gait and mobility  Other low back pain  ONSET DATE: 2019 started with intermittent flare up's since.   SUBJECTIVE:  SUBJECTIVE STATEMENT:  Pt reports ongoing great toe numbness in L foot. No soreness after last session. No back pain at entry. Reports HEP compliance without issue. I want to get back out in the gym!  PERTINENT HISTORY:  Grade 1 anterolisthesis Arthritis, DM II, HTN  PAIN:    PRECAUTIONS: None  RED FLAGS: None   WEIGHT BEARING RESTRICTIONS: No  FALLS:  Has patient fallen in last 6 months? No  LIVING ENVIRONMENT: Lives with: lives alone Lives in: House/apartment Stairs: Yes: Internal: 1 flight steps; on right going up and External: 2-3 steps; on right going up Has following equipment at home: None  OCCUPATION: Unemployed   PLOF: Independent  PATIENT GOALS: Pt would like to be able to move without continued pain.   NEXT MD VISIT: None scheduled to date.   OBJECTIVE:  Note: Objective measures were completed at Evaluation unless otherwise noted.  DIAGNOSTIC FINDINGS:  1. Progressive degenerative disc disease and facet hypertrophy in the lower lumbar spine. 2. Grade 1 anterolisthesis of L4 on L5, slightly increased from 2019. 3.  Degenerative change of the sacroiliac joints, also progressed.  Treatment:  Nustep L6 x 5 mins (seat 10) Supine lumbar rotation x 10  Supine PPT 2x10 5 hold Side steping with RTB at ankles x2laps at rail (cues for posture and hip orientation) Hip flexor stretch standing 20sec x2ea Seated lumbar flexion stretch 5x10sec Seated fig 4 stretch 30sec x1ea PPT against wall 5 2x10 Sit to stands from table TrA 2x10 Step ups with cues for glute activation 6 x10ea                                                                                                                         PATIENT EDUCATION:  Education details: reviewed HEP; objective findings, POC, exercise form, goal progress.  Person educated: Patient Education method: Medical illustrator Education comprehension: verbalized understanding and returned demonstration  HOME EXERCISE PROGRAM: Access Code: [email or web address] URL: [email or web address]   ASSESSMENT:  CLINICAL IMPRESSION:  Continued to work on hip  flexor stretching and core strengthening/stabilization to help correct anterior tilted/lordotic posture. Provided pt with verbal cues throughout session for proper positioning and performance with exercises. Felt muscular fatigue with step ups and sit to stands though denied increased pain. Will continue to progress towards return to gym program.     OBJECTIVE IMPAIRMENTS: decreased activity tolerance, difficulty walking, decreased balance, decreased endurance, decreased mobility, decreased ROM, decreased strength, impaired flexibility, impaired UE/LE use, postural dysfunction, and pain.  ACTIVITY LIMITATIONS: bending, lifting, carry, locomotion, cleaning, community activity, driving, and or occupation  PERSONAL FACTORS:  Arthritis, DM II, HTN are also affecting patient's functional outcome.  REHAB POTENTIAL: Good  CLINICAL DECISION MAKING: Stable/uncomplicated  EVALUATION COMPLEXITY:  Low    GOALS: Short term PT Goals Target date: 01/09/2024 Pt will be I and compliant with HEP. Baseline:  Goal status: ONGOING Pt will decrease pain by 25% overall Baseline: Goal status: GOAL MET 5/8  Long term PT goals  Target date: 04/05/2024 Pt will improve ROM to [facility] to improve functional mobility Baseline: Goal status: New Pt will improve  hip/knee strength to at least 4+/5 MMT to improve functional strength Baseline: Goal status: PROGRESSING  5/8 Pt will improve OSWESTRY to at least 11% functional to show improved function Baseline: Goal status: PROGRESSING  5/8 Pt will reduce pain by overall 50% overall with usual activity Baseline: Goal status: PROGRESSING  5/8 Pt will be able to negotiate stairs with a reciprocal gait pattern with use of 1x hand rail if needed. Baseline: Goal status: Not assessed   Pt will be able to ambulate community distances at least 1000 ft WNL gait pattern without complaints Baseline: Goal status: ONGOING     7. Pt will improve her STS by 2.3 seconds for Minimal clinically important difference.         Baseline:          Goal status: 50% met  5/8     8.  Pt will report at least a 50% improvement in standing tolerance.     Goal status:  INITIAL  PLAN: PT FREQUENCY: 1-2times per week   PT DURATION: 6-8 weeks  PLANNED INTERVENTIONS (unless contraindicated): aquatic PT, Canalith repositioning, cryotherapy, Electrical stimulation, Iontophoresis with 4 mg/ml dexamethasome, Moist heat, traction, Ultrasound, gait training, Therapeutic exercise, balance training, neuromuscular re-education, patient/family education, prosthetic training, manual techniques, passive ROM, dry needling, taping, vasopnuematic device, vestibular, spinal manipulations, joint manipulations   PLAN FOR NEXT SESSION: Assess HEP/update PRN, continue to progress functional mobility, strengthen proximal hip muscles and core.   Herb Loges, PTA  04/04/24 12:04 PM

## 2024-04-05 ENCOUNTER — Ambulatory Visit (HOSPITAL_BASED_OUTPATIENT_CLINIC_OR_DEPARTMENT_OTHER): Payer: Self-pay | Admitting: Physical Therapy

## 2024-04-05 ENCOUNTER — Encounter (HOSPITAL_BASED_OUTPATIENT_CLINIC_OR_DEPARTMENT_OTHER): Payer: Self-pay | Admitting: Physical Therapy

## 2024-04-05 DIAGNOSIS — M6281 Muscle weakness (generalized): Secondary | ICD-10-CM

## 2024-04-05 DIAGNOSIS — M5459 Other low back pain: Secondary | ICD-10-CM

## 2024-04-05 DIAGNOSIS — R2689 Other abnormalities of gait and mobility: Secondary | ICD-10-CM

## 2024-04-05 NOTE — Therapy (Signed)
 OUTPATIENT PHYSICAL THERAPY THORACOLUMBAR TREATMENT    Patient Name: Jordan Ramirez MRN: [id number] DOB:1960/11/17, 63 y.o., female Today's Date: 04/05/2024  END OF SESSION:  PT End of Session - 04/05/24 1213     Visit Number 9    Number of Visits 12    Date for PT Re-Evaluation 04/09/24    Authorization Type [place] MEDICAID PREPAID HEALTH PLAN    Authorization Time Period 5/8-7/6    Authorization - Visit Number 5    Authorization - Number of Visits 6    PT Start Time 1213    PT Stop Time 1256    PT Time Calculation (min) 43 min    Activity Tolerance Patient tolerated treatment well    Behavior During Therapy [facility] for tasks assessed/performed                Past Medical History:  Diagnosis Date   Anemia    Anxiety    Arthritis    Asthma    Chronic low back pain    Diabetes mellitus without complication (HCC)    Fibroid    Hypertension    Neuromuscular disorder [facility]) July 2019   Treatment by Orthopedic specialist   Obesity    Routine general medical examination at a health care facility 07/13/2023   Past Surgical History:  Procedure Laterality Date   PILONIDAL CYST EXCISION     WISDOM TOOTH EXTRACTION     Patient Active Problem List   Diagnosis Date Noted   Chest pain 01/19/2024   Type 2 diabetes mellitus with obesity (HCC) 01/19/2024   Nonspecific chest pain 01/19/2024   Chest pressure 01/18/2024   Nausea and vomiting 01/18/2024   Transaminitis 01/18/2024   Routine general medical examination at a health care facility 07/13/2023   Peripheral polyneuropathy 05/25/2022   Chronic bilateral low back pain 05/25/2022   Mixed incontinence urge and stress 02/13/2020   Bilateral lower extremity edema 02/13/2020   Ganglion cyst of dorsum of right wrist 01/16/2018   Bilateral knee pain 04/17/2012   Pain of left heel 04/17/2012   Diabetes mellitus type 2 with neurological manifestations (HCC) 08/28/2009   THUMB PAIN, LEFT 08/27/2009   Hyperlipemia 08/20/2009    Morbid obesity (HCC)-BMI 37.4. Co morbilities HTN,DM II,HLD,OA-urine incontinence. 08/20/2009   ANEMIA-NOS 08/20/2009   Essential hypertension 08/20/2009   Allergic rhinitis 08/20/2009   Asthma 08/20/2009   MENORRHAGIA, PERIMENOPAUSAL 08/20/2009   DEGENERATIVE JOINT DISEASE, KNEE 08/20/2009   TENDINITIS, LEFT THUMB 08/20/2009    PCP: [place], Betty G, MD   REFERRING PROVIDER: [place], Betty G, MD   REFERRING DIAG:  Chronic right-sided low back pain, unspecified whether sciatica present [M54.50, G89.29]   Rationale for Evaluation and Treatment: Rehabilitation  THERAPY DIAG:  Muscle weakness (generalized)  Other abnormalities of gait and mobility  Other low back pain  ONSET DATE: 2019 started with intermittent flare up's since.   SUBJECTIVE:  SUBJECTIVE STATEMENT:   No issues today, I don't feel any pain.   PERTINENT HISTORY:  Grade 1 anterolisthesis Arthritis, DM II, HTN  PAIN:    PRECAUTIONS: None  RED FLAGS: None   WEIGHT BEARING RESTRICTIONS: No  FALLS:  Has patient fallen in last 6 months? No  LIVING ENVIRONMENT: Lives with: lives alone Lives in: House/apartment Stairs: Yes: Internal: 1 flight steps; on right going up and External: 2-3 steps; on right going up Has following equipment at home: None  OCCUPATION: Unemployed   PLOF: Independent  PATIENT GOALS: Pt would like to be able to move without continued pain.   NEXT MD VISIT: None scheduled to date.   OBJECTIVE:  Note: Objective measures were completed at Evaluation unless otherwise noted.  DIAGNOSTIC FINDINGS:  1. Progressive degenerative disc disease and facet hypertrophy in the lower lumbar spine. 2. Grade 1 anterolisthesis of L4 on L5, slightly increased from 2019. 3. Degenerative change of the sacroiliac  joints, also progressed.  Treatment:  Treatment                            6/19: Blank lines following charge title = not provided on this treatment date.   Manual:  TPDN No  There-ex: Gym: leg press, leg curl, leg extension, hip abd/add red band, biceps curls, UE abd to 90 There-Act:  Self Care:  Nuro-Re-ed:  Gait Training:    LAST VISIT: Nustep L6 x 5 mins (seat 10) Supine lumbar rotation x 10  Supine PPT 2x10 5 hold Side steping with RTB at ankles x2laps at rail (cues for posture and hip orientation) Hip flexor stretch standing 20sec x2ea Seated lumbar flexion stretch 5x10sec Seated fig 4 stretch 30sec x1ea PPT against wall 5 2x10 Sit to stands from table TrA 2x10 Step ups with cues for glute activation 6 x10ea                                                                                                                         PATIENT EDUCATION:  Education details: reviewed HEP; objective findings, POC, exercise form, goal progress.  Person educated: Patient Education method: Medical illustrator Education comprehension: verbalized understanding and returned demonstration  HOME EXERCISE PROGRAM: Access Code: [email or web address] URL: [email or web address]   ASSESSMENT:  CLINICAL IMPRESSION:  Focus today on gym program in [place] for lower body with core engagement. Will benefit from expansion of core and upper body program. Requires measurements and insurance re-auth at next visit.      OBJECTIVE IMPAIRMENTS: decreased activity tolerance, difficulty walking, decreased balance, decreased endurance, decreased mobility, decreased ROM, decreased strength, impaired flexibility, impaired UE/LE use, postural dysfunction, and pain.  ACTIVITY LIMITATIONS: bending, lifting, carry, locomotion, cleaning, community activity, driving, and or occupation  PERSONAL FACTORS:  Arthritis, DM II, HTN are also affecting patient's functional outcome.  REHAB  POTENTIAL: Good  CLINICAL DECISION MAKING: Stable/uncomplicated  EVALUATION COMPLEXITY: Low    GOALS: Short term PT  Goals Target date: 01/09/2024 Pt will be I and compliant with HEP. Baseline:  Goal status: ONGOING Pt will decrease pain by 25% overall Baseline: Goal status: GOAL MET 5/8  Long term PT goals Target date: 04/05/2024 Pt will improve ROM to [facility] to improve functional mobility Baseline: Goal status: New Pt will improve  hip/knee strength to at least 4+/5 MMT to improve functional strength Baseline: Goal status: PROGRESSING  5/8 Pt will improve OSWESTRY to at least 11% functional to show improved function Baseline: Goal status: PROGRESSING  5/8 Pt will reduce pain by overall 50% overall with usual activity Baseline: Goal status: PROGRESSING  5/8 Pt will be able to negotiate stairs with a reciprocal gait pattern with use of 1x hand rail if needed. Baseline: Goal status: Not assessed   Pt will be able to ambulate community distances at least 1000 ft WNL gait pattern without complaints Baseline: Goal status: ONGOING     7. Pt will improve her STS by 2.3 seconds for Minimal clinically important difference.         Baseline:          Goal status: 50% met  5/8     8.  Pt will report at least a 50% improvement in standing tolerance.     Goal status:  INITIAL  PLAN: PT FREQUENCY: 1-2times per week   PT DURATION: 6-8 weeks  PLANNED INTERVENTIONS (unless contraindicated): aquatic PT, Canalith repositioning, cryotherapy, Electrical stimulation, Iontophoresis with 4 mg/ml dexamethasome, Moist heat, traction, Ultrasound, gait training, Therapeutic exercise, balance training, neuromuscular re-education, patient/family education, prosthetic training, manual techniques, passive ROM, dry needling, taping, vasopnuematic device, vestibular, spinal manipulations, joint manipulations   PLAN FOR NEXT SESSION: Assess HEP/update PRN, continue to progress functional mobility,  strengthen proximal hip muscles and core.   Lener Ventresca C. Sueo Cullen PT, DPT 04/05/24 12:59 PM

## 2024-04-06 NOTE — Telephone Encounter (Signed)
 Pharmacy Patient Advocate Encounter  Received notification from [facility] that Prior Authorization for Dexcom G6 sensor has been DENIED.  Full denial letter will be uploaded to the media tab. See denial reason below.   PA #/Case ID/Reference #: [id number]

## 2024-04-09 ENCOUNTER — Encounter (HOSPITAL_BASED_OUTPATIENT_CLINIC_OR_DEPARTMENT_OTHER): Admitting: Rehabilitative and Restorative Service Providers"

## 2024-04-10 DIAGNOSIS — F4321 Adjustment disorder with depressed mood: Secondary | ICD-10-CM | POA: Diagnosis not present

## 2024-04-11 ENCOUNTER — Ambulatory Visit (HOSPITAL_BASED_OUTPATIENT_CLINIC_OR_DEPARTMENT_OTHER): Admitting: Rehabilitative and Restorative Service Providers"

## 2024-04-11 ENCOUNTER — Encounter (HOSPITAL_BASED_OUTPATIENT_CLINIC_OR_DEPARTMENT_OTHER): Payer: Self-pay | Admitting: Rehabilitative and Restorative Service Providers"

## 2024-04-11 DIAGNOSIS — M6281 Muscle weakness (generalized): Secondary | ICD-10-CM

## 2024-04-11 DIAGNOSIS — M5459 Other low back pain: Secondary | ICD-10-CM

## 2024-04-11 DIAGNOSIS — R2689 Other abnormalities of gait and mobility: Secondary | ICD-10-CM

## 2024-04-11 DIAGNOSIS — G8929 Other chronic pain: Secondary | ICD-10-CM

## 2024-04-11 NOTE — Addendum Note (Signed)
 Addended by: GEORGINA ALGER PARAS on: 04/11/2024 04:35 PM   Modules accepted: Orders

## 2024-04-11 NOTE — Therapy (Addendum)
 OUTPATIENT PHYSICAL THERAPY THORACOLUMBAR TREATMENT/RECERT   Patient Name: Jordan Ramirez MRN: [id number] DOB:Aug 29, 1961, 63 y.o., female Today's Date: 04/11/2024  END OF SESSION:  PT End of Session - 04/11/24 1526     Visit Number 10    Date for PT Re-Evaluation 05/07/24    Authorization Type [place] MEDICAID PREPAID HEALTH PLAN    Authorization Time Period 5/8-7/6    Authorization - Visit Number 6    Authorization - Number of Visits 6    PT Start Time 0328    PT Stop Time 0417    PT Time Calculation (min) 49 min    Activity Tolerance Patient tolerated treatment well;No increased pain    Behavior During Therapy [facility] for tasks assessed/performed                 Past Medical History:  Diagnosis Date   Anemia    Anxiety    Arthritis    Asthma    Chronic low back pain    Diabetes mellitus without complication (HCC)    Fibroid    Hypertension    Neuromuscular disorder [facility]) July 2019   Treatment by Orthopedic specialist   Obesity    Routine general medical examination at a health care facility 07/13/2023   Past Surgical History:  Procedure Laterality Date   PILONIDAL CYST EXCISION     WISDOM TOOTH EXTRACTION     Patient Active Problem List   Diagnosis Date Noted   Chest pain 01/19/2024   Type 2 diabetes mellitus with obesity (HCC) 01/19/2024   Nonspecific chest pain 01/19/2024   Chest pressure 01/18/2024   Nausea and vomiting 01/18/2024   Transaminitis 01/18/2024   Routine general medical examination at a health care facility 07/13/2023   Peripheral polyneuropathy 05/25/2022   Chronic bilateral low back pain 05/25/2022   Mixed incontinence urge and stress 02/13/2020   Bilateral lower extremity edema 02/13/2020   Ganglion cyst of dorsum of right wrist 01/16/2018   Bilateral knee pain 04/17/2012   Pain of left heel 04/17/2012   Diabetes mellitus type 2 with neurological manifestations (HCC) 08/28/2009   THUMB PAIN, LEFT 08/27/2009   Hyperlipemia 08/20/2009    Morbid obesity (HCC)-BMI 37.4. Co morbilities HTN,DM II,HLD,OA-urine incontinence. 08/20/2009   ANEMIA-NOS 08/20/2009   Essential hypertension 08/20/2009   Allergic rhinitis 08/20/2009   Asthma 08/20/2009   MENORRHAGIA, PERIMENOPAUSAL 08/20/2009   DEGENERATIVE JOINT DISEASE, KNEE 08/20/2009   TENDINITIS, LEFT THUMB 08/20/2009    PCP: [place], Betty G, MD   REFERRING PROVIDER: [place], Betty G, MD   REFERRING DIAG:  Chronic right-sided low back pain, unspecified whether sciatica present [M54.50, G89.29]   Rationale for Evaluation and Treatment: Rehabilitation  THERAPY DIAG:  Muscle weakness (generalized)  Other abnormalities of gait and mobility  Other low back pain  Chronic pain of right knee  Chronic pain of left knee  ONSET DATE: 2019 started with intermittent flare up's since.   SUBJECTIVE:  SUBJECTIVE STATEMENT: No pain; over the last week, it has been about a 3/10 when I was relaxing but today I am not having any pain.   PERTINENT HISTORY:  Grade 1 anterolisthesis Arthritis, DM II, HTN  PAIN:    PRECAUTIONS: None  RED FLAGS: None   WEIGHT BEARING RESTRICTIONS: No  FALLS:  Has patient fallen in last 6 months? No  LIVING ENVIRONMENT: Lives with: lives alone Lives in: House/apartment Stairs: Yes: Internal: 1 flight steps; on right going up and External: 2-3 steps; on right going up Has following equipment at home: None  OCCUPATION: Unemployed   PLOF: Independent  PATIENT GOALS: Pt would like to be able to move without continued pain.   NEXT MD VISIT: None scheduled to date.   OBJECTIVE:  Note: Objective measures were completed at Evaluation unless otherwise noted.  DIAGNOSTIC FINDINGS:  1. Progressive degenerative disc disease and facet hypertrophy in the lower  lumbar spine. 2. Grade 1 anterolisthesis of L4 on L5, slightly increased from 2019. 3. Degenerative change of the sacroiliac joints, also progressed.  Treatment:  [facility] Adult PT Treatment:                                                DATE: 04/11/24 Therapeutic Exercise: GYM program established for core and upper body. Chest press, Row, Lat Pulldown, Tricep pressdown, Bicep curls, Plynthe (tilt, tilt with march). All written on gym paper and filed in gym. Pt able to adjust seat and weight safely.   REEVAL DONE  Treatment                            6/19: Blank lines following charge title = not provided on this treatment date.   Manual:  TPDN No  There-ex: Gym: leg press, leg curl, leg extension, hip abd/add red band, biceps curls, UE abd to 90 There-Act:  Self Care:  Nuro-Re-ed:  Gait Training:    LAST VISIT: Nustep L6 x 5 mins (seat 10) Supine lumbar rotation x 10  Supine PPT 2x10 5 hold Side steping with RTB at ankles x2laps at rail (cues for posture and hip orientation) Hip flexor stretch standing 20sec x2ea Seated lumbar flexion stretch 5x10sec Seated fig 4 stretch 30sec x1ea PPT against wall 5 2x10 Sit to stands from table TrA 2x10 Step ups with cues for glute activation 6 x10ea                                                                                                                         PATIENT EDUCATION:  Education details: reviewed HEP; objective findings, POC, exercise form, goal progress.  Person educated: Patient Education method: Medical illustrator Education comprehension: verbalized understanding and returned demonstration  HOME EXERCISE PROGRAM: Access Code: [email or web address] URL: [email or web address]   ASSESSMENT:  CLINICAL  IMPRESSION:  Focus today on gym program in [place] for upper body strengthening with core engagement and tilt therex. Pt would benefit from continued therapy for review of gym program and  progression of core and LE therex. Pt still hesitant in perform descending of stairs which increases her falls risk.    OBJECTIVE IMPAIRMENTS: decreased activity tolerance, difficulty walking, decreased balance, decreased endurance, decreased mobility, decreased ROM, decreased strength, impaired flexibility, impaired UE/LE use, postural dysfunction, and pain.  ACTIVITY LIMITATIONS: bending, lifting, carry, locomotion, cleaning, community activity, driving, and or occupation  PERSONAL FACTORS:  Arthritis, DM II, HTN are also affecting patient's functional outcome.  REHAB POTENTIAL: Good  CLINICAL DECISION MAKING: Stable/uncomplicated  EVALUATION COMPLEXITY: Low    GOALS: Short term PT Goals Target date: 01/09/2024 Pt will be I and compliant with HEP. Baseline:  Goal status: ONGOING Pt will decrease pain by 25% overall Baseline: Goal status: GOAL MET 5/8  Long term PT goals Target date: 05/07/24 Pt will improve ROM to [facility] to improve functional mobility Baseline: Goal status: MET Pt will improve  hip/knee strength to at least 4+/5 MMT to improve functional strength Baseline: Goal status: MET Pt will improve OSWESTRY to at least 11% functional to show improved function Baseline: Goal status: ongoing Pt will reduce pain by overall 50% overall with usual activity Baseline: Goal status: MET Pt will be able to negotiate stairs with a reciprocal gait pattern with use of 1x hand rail if needed. Baseline: Goal status: ongoing Pt will be able to ambulate community distances at least 1000 ft WNL gait pattern without complaints Baseline: Goal status: MET     7. Pt will improve her STS by 2.3 seconds for Minimal clinically important difference.         Baseline:          Goal status: MET     8.  Pt will report at least a 50% improvement in standing tolerance.     Goal status:  MET     9. Pt will be independent in [employer] program in prep for discharge.    Goal Status:  Ongoing 10. Pt will report >/=75% improvement in bil LE eccentric control in descending stairs to decrease falls risk.  Goal status: unmet  PLAN: PT FREQUENCY: 1-2times per week   PT DURATION: 4 weeks  PLANNED INTERVENTIONS (unless contraindicated): aquatic PT, Canalith repositioning, cryotherapy, Electrical stimulation, Iontophoresis with 4 mg/ml dexamethasome, Moist heat, traction, Ultrasound, gait training, Therapeutic exercise, balance training, neuromuscular re-education, patient/family education, prosthetic training, manual techniques, passive ROM, dry needling, taping, vasopnuematic device, vestibular, spinal manipulations, joint manipulations   PLAN FOR NEXT SESSION: continue to progress functional mobility, stair training, strengthen proximal hip muscles and core, review gym program as needed  For all possible CPT codes, reference the Planned Interventions line above.     Check all conditions that are expected to impact treatment: {Conditions expected to impact treatment:Morbid obesity and Diabetes mellitus   If treatment provided at initial evaluation, no treatment charged due to lack of authorization.

## 2024-04-13 ENCOUNTER — Ambulatory Visit (HOSPITAL_BASED_OUTPATIENT_CLINIC_OR_DEPARTMENT_OTHER): Admitting: Rehabilitative and Restorative Service Providers"

## 2024-04-13 ENCOUNTER — Encounter (HOSPITAL_BASED_OUTPATIENT_CLINIC_OR_DEPARTMENT_OTHER): Payer: Self-pay | Admitting: Rehabilitative and Restorative Service Providers"

## 2024-04-13 DIAGNOSIS — M6281 Muscle weakness (generalized): Secondary | ICD-10-CM

## 2024-04-13 DIAGNOSIS — M5459 Other low back pain: Secondary | ICD-10-CM

## 2024-04-13 DIAGNOSIS — R2689 Other abnormalities of gait and mobility: Secondary | ICD-10-CM

## 2024-04-13 NOTE — Therapy (Signed)
 OUTPATIENT PHYSICAL THERAPY THORACOLUMBAR TREATMENT   Patient Name: Jordan Ramirez MRN: [id number] DOB:September 28, 1961, 63 y.o., female Today's Date: 04/13/2024  END OF SESSION:  PT End of Session - 04/13/24 1308     Visit Number 11    Date for PT Re-Evaluation 05/07/24    Authorization Type [place] MEDICAID PREPAID HEALTH PLAN    Authorization Time Period 6/27-7/26    Authorization - Visit Number 1    Authorization - Number of Visits 3    PT Start Time 0102    PT Stop Time 0155    PT Time Calculation (min) 53 min    Activity Tolerance Patient tolerated treatment well;No increased pain    Behavior During Therapy [facility] for tasks assessed/performed                  Past Medical History:  Diagnosis Date   Anemia    Anxiety    Arthritis    Asthma    Chronic low back pain    Diabetes mellitus without complication (HCC)    Fibroid    Hypertension    Neuromuscular disorder [facility]) July 2019   Treatment by Orthopedic specialist   Obesity    Routine general medical examination at a health care facility 07/13/2023   Past Surgical History:  Procedure Laterality Date   PILONIDAL CYST EXCISION     WISDOM TOOTH EXTRACTION     Patient Active Problem List   Diagnosis Date Noted   Chest pain 01/19/2024   Type 2 diabetes mellitus with obesity (HCC) 01/19/2024   Nonspecific chest pain 01/19/2024   Chest pressure 01/18/2024   Nausea and vomiting 01/18/2024   Transaminitis 01/18/2024   Routine general medical examination at a health care facility 07/13/2023   Peripheral polyneuropathy 05/25/2022   Chronic bilateral low back pain 05/25/2022   Mixed incontinence urge and stress 02/13/2020   Bilateral lower extremity edema 02/13/2020   Ganglion cyst of dorsum of right wrist 01/16/2018   Bilateral knee pain 04/17/2012   Pain of left heel 04/17/2012   Diabetes mellitus type 2 with neurological manifestations (HCC) 08/28/2009   THUMB PAIN, LEFT 08/27/2009   Hyperlipemia 08/20/2009    Morbid obesity (HCC)-BMI 37.4. Co morbilities HTN,DM II,HLD,OA-urine incontinence. 08/20/2009   ANEMIA-NOS 08/20/2009   Essential hypertension 08/20/2009   Allergic rhinitis 08/20/2009   Asthma 08/20/2009   MENORRHAGIA, PERIMENOPAUSAL 08/20/2009   DEGENERATIVE JOINT DISEASE, KNEE 08/20/2009   TENDINITIS, LEFT THUMB 08/20/2009    PCP: [place], Betty G, MD   REFERRING PROVIDER: [place], Betty G, MD   REFERRING DIAG:  Chronic right-sided low back pain, unspecified whether sciatica present [M54.50, G89.29]   Rationale for Evaluation and Treatment: Rehabilitation  THERAPY DIAG:  Muscle weakness (generalized)  Other abnormalities of gait and mobility  Other low back pain  ONSET DATE: 2019 started with intermittent flare up's since.   SUBJECTIVE:  SUBJECTIVE STATEMENT: No pain; I am good  PERTINENT HISTORY:  Grade 1 anterolisthesis Arthritis, DM II, HTN  PAIN:    PRECAUTIONS: None  RED FLAGS: None   WEIGHT BEARING RESTRICTIONS: No  FALLS:  Has patient fallen in last 6 months? No  LIVING ENVIRONMENT: Lives with: lives alone Lives in: House/apartment Stairs: Yes: Internal: 1 flight steps; on right going up and External: 2-3 steps; on right going up Has following equipment at home: None  OCCUPATION: Unemployed   PLOF: Independent  PATIENT GOALS: Pt would like to be able to move without continued pain.   NEXT MD VISIT: None scheduled to date.   OBJECTIVE:  Note: Objective measures were completed at Evaluation unless otherwise noted.  DIAGNOSTIC FINDINGS:  1. Progressive degenerative disc disease and facet hypertrophy in the lower lumbar spine. 2. Grade 1 anterolisthesis of L4 on L5, slightly increased from 2019. 3. Degenerative change of the sacroiliac joints, also  progressed.  Treatment:  [facility] Adult PT Treatment:                                                DATE: 04/13/24 Therapeutic Exercise: UBE level 4 x 5 min posterior Gym program review; pt was able to return demo all with good technique but needed max verbal cues for breathing during pelvic tilt   [facility] Adult PT Treatment:                                                DATE: 04/11/24 Therapeutic Exercise: GYM program established for core and upper body. Chest press, Row, Lat Pulldown, Tricep pressdown, Bicep curls, Plynthe (tilt, tilt with march). All written on gym paper and filed in gym. Pt able to adjust seat and weight safely.   REEVAL DONE  Treatment                            6/19: Blank lines following charge title = not provided on this treatment date.   Manual:  TPDN No  There-ex: Gym: leg press, leg curl, leg extension, hip abd/add red band, biceps curls, UE abd to 90 There-Act:  Self Care:  Nuro-Re-ed:  Gait Training:    LAST VISIT: Nustep L6 x 5 mins (seat 10) Supine lumbar rotation x 10  Supine PPT 2x10 5 hold Side steping with RTB at ankles x2laps at rail (cues for posture and hip orientation) Hip flexor stretch standing 20sec x2ea Seated lumbar flexion stretch 5x10sec Seated fig 4 stretch 30sec x1ea PPT against wall 5 2x10 Sit to stands from table TrA 2x10 Step ups with cues for glute activation 6 x10ea  PATIENT EDUCATION:  Education details: reviewed HEP; objective findings, POC, exercise form, goal progress.  Person educated: Patient Education method: Medical illustrator Education comprehension: verbalized understanding and returned demonstration  HOME EXERCISE PROGRAM: Access Code: [email or web address] URL: [email or web address]   ASSESSMENT:  CLINICAL IMPRESSION:  Focus today on gym program in [place] for  upper/lower body strengthening with core engagement and tilt therex. Pt would benefit from continued therapy for review of gym program and progression of core and LE therex. Pt still hesitant in perform descending of stairs which increases her falls risk.  Was approved for 3 more MCD visits  OBJECTIVE IMPAIRMENTS: decreased activity tolerance, difficulty walking, decreased balance, decreased endurance, decreased mobility, decreased ROM, decreased strength, impaired flexibility, impaired UE/LE use, postural dysfunction, and pain.  ACTIVITY LIMITATIONS: bending, lifting, carry, locomotion, cleaning, community activity, driving, and or occupation  PERSONAL FACTORS:  Arthritis, DM II, HTN are also affecting patient's functional outcome.  REHAB POTENTIAL: Good  CLINICAL DECISION MAKING: Stable/uncomplicated  EVALUATION COMPLEXITY: Low    GOALS: Short term PT Goals Target date: 01/09/2024 Pt will be I and compliant with HEP. Baseline:  Goal status: ONGOING Pt will decrease pain by 25% overall Baseline: Goal status: GOAL MET 5/8  Long term PT goals Target date: 05/07/24 Pt will improve ROM to [facility] to improve functional mobility Baseline: Goal status: MET Pt will improve  hip/knee strength to at least 4+/5 MMT to improve functional strength Baseline: Goal status: MET Pt will improve OSWESTRY to at least 11% functional to show improved function Baseline: Goal status: ongoing Pt will reduce pain by overall 50% overall with usual activity Baseline: Goal status: MET Pt will be able to negotiate stairs with a reciprocal gait pattern with use of 1x hand rail if needed. Baseline: Goal status: ongoing Pt will be able to ambulate community distances at least 1000 ft WNL gait pattern without complaints Baseline: Goal status: MET     7. Pt will improve her STS by 2.3 seconds for Minimal clinically important difference.         Baseline:          Goal status: MET     8.  Pt will report at  least a 50% improvement in standing tolerance.     Goal status:  MET     9. Pt will be independent in [employer] program in prep for discharge.    Goal Status: Ongoing 10. Pt will report >/=75% improvement in bil LE eccentric control in descending stairs to decrease falls risk.  Goal status: unmet  PLAN: PT FREQUENCY: 1-2times per week   PT DURATION: 4 weeks  PLANNED INTERVENTIONS (unless contraindicated): aquatic PT, cryotherapy, Electrical stimulation, Moist heat, Ultrasound, gait training, Therapeutic exercise, neuromuscular re-education, patient/family education,  manual techniques, theract, self care  PLAN FOR NEXT SESSION: continue to progress functional mobility, stair training, strengthen proximal hip muscles and core, review gym program as needed  For all possible CPT codes, reference the Planned Interventions line above.     Check all conditions that are expected to impact treatment: {Conditions expected to impact treatment:Morbid obesity and Diabetes mellitus   If treatment provided at initial evaluation, no treatment charged due to lack of authorization.

## 2024-04-16 ENCOUNTER — Ambulatory Visit (HOSPITAL_BASED_OUTPATIENT_CLINIC_OR_DEPARTMENT_OTHER): Admitting: Physical Therapy

## 2024-04-18 ENCOUNTER — Ambulatory Visit (HOSPITAL_BASED_OUTPATIENT_CLINIC_OR_DEPARTMENT_OTHER): Attending: Family Medicine | Admitting: Physical Therapy

## 2024-04-18 DIAGNOSIS — M6281 Muscle weakness (generalized): Secondary | ICD-10-CM | POA: Insufficient documentation

## 2024-04-18 DIAGNOSIS — M5459 Other low back pain: Secondary | ICD-10-CM | POA: Insufficient documentation

## 2024-04-18 DIAGNOSIS — R2689 Other abnormalities of gait and mobility: Secondary | ICD-10-CM | POA: Diagnosis not present

## 2024-04-18 NOTE — Therapy (Signed)
 OUTPATIENT PHYSICAL THERAPY THORACOLUMBAR TREATMENT   Patient Name: Jordan Ramirez MRN: [id number] DOB:[birth year], 63 y.o., female Today's Date: 04/19/2024  END OF SESSION:  PT End of Session - 04/18/24 0859     Visit Number 12    Number of Visits 13    Date for PT Re-Evaluation 05/07/24    Authorization Type [place] MEDICAID PREPAID HEALTH PLAN    PT Start Time 0805    PT Stop Time [phone]    PT Time Calculation (min) 47 min    Activity Tolerance Patient tolerated treatment well    Behavior During Therapy [facility] for tasks assessed/performed                   Past Medical History:  Diagnosis Date   Anemia    Anxiety    Arthritis    Asthma    Chronic low back pain    Diabetes mellitus without complication (HCC)    Fibroid    Hypertension    Neuromuscular disorder [facility]) July 2019   Treatment by Orthopedic specialist   Obesity    Routine general medical examination at a health care facility 07/13/2023   Past Surgical History:  Procedure Laterality Date   PILONIDAL CYST EXCISION     WISDOM TOOTH EXTRACTION     Patient Active Problem List   Diagnosis Date Noted   Chest pain 01/19/2024   Type 2 diabetes mellitus with obesity (HCC) 01/19/2024   Nonspecific chest pain 01/19/2024   Chest pressure 01/18/2024   Nausea and vomiting 01/18/2024   Transaminitis 01/18/2024   Routine general medical examination at a health care facility 07/13/2023   Peripheral polyneuropathy 05/25/2022   Chronic bilateral low back pain 05/25/2022   Mixed incontinence urge and stress 02/13/2020   Bilateral lower extremity edema 02/13/2020   Ganglion cyst of dorsum of right wrist 01/16/2018   Bilateral knee pain 04/17/2012   Pain of left heel 04/17/2012   Diabetes mellitus type 2 with neurological manifestations (HCC) 08/28/2009   THUMB PAIN, LEFT 08/27/2009   Hyperlipemia 08/20/2009   Morbid obesity (HCC)-BMI 37.4. Co morbilities HTN,DM II,HLD,OA-urine incontinence. 08/20/2009   ANEMIA-NOS  08/20/2009   Essential hypertension 08/20/2009   Allergic rhinitis 08/20/2009   Asthma 08/20/2009   MENORRHAGIA, PERIMENOPAUSAL 08/20/2009   DEGENERATIVE JOINT DISEASE, KNEE 08/20/2009   TENDINITIS, LEFT THUMB 08/20/2009    PCP: [place], Betty G, MD   REFERRING PROVIDER: [place], Betty G, MD   REFERRING DIAG:  Chronic right-sided low back pain, unspecified whether sciatica present [M54.50, G89.29]   Rationale for Evaluation and Treatment: Rehabilitation  THERAPY DIAG:  Other low back pain  Muscle weakness (generalized)  Other abnormalities of gait and mobility  ONSET DATE: 2019 started with intermittent flare up's since.   SUBJECTIVE:  SUBJECTIVE STATEMENT: Pt states she has a little nagging pain in L knee.  Pt felt like she was challenged last visit and she was building up her muscles.  She had no adverse effects after prior Rx.   PERTINENT HISTORY:  Grade 1 anterolisthesis Arthritis, DM II, HTN  PAIN:  2/10 L knee   PRECAUTIONS: None  RED FLAGS: None   WEIGHT BEARING RESTRICTIONS: No  FALLS:  Has patient fallen in last 6 months? No  LIVING ENVIRONMENT: Lives with: lives alone Lives in: House/apartment Stairs: Yes: Internal: 1 flight steps; on right going up and External: 2-3 steps; on right going up Has following equipment at home: None  OCCUPATION: Unemployed   PLOF: Independent  PATIENT GOALS: Pt would like to be able to move without continued pain.   NEXT MD VISIT: None scheduled to date.   OBJECTIVE:  Note: Objective measures were completed at Evaluation unless otherwise noted.  DIAGNOSTIC FINDINGS:  1. Progressive degenerative disc disease and facet hypertrophy in the lower lumbar spine. 2. Grade 1 anterolisthesis of L4 on L5, slightly increased from 2019. 3.  Degenerative change of the sacroiliac joints, also progressed.  Treatment:  04/18/24  Nustep L6 x 5 mins UE/Les Sit to stands with TrA 2x10 Cybex Leg press 50# x15, 60# 2x10 LF Knee extension 15# 3x10 Standing cable rows with TrA 5# 2x10, 7.5# x10 LF seated HS curl 40# 3x10  See below for pt education   [facility] Adult PT Treatment:                                                DATE: 04/13/24 Therapeutic Exercise: UBE level 4 x 5 min posterior Gym program review; pt was able to return demo all with good technique but needed max verbal cues for breathing during pelvic tilt   [facility] Adult PT Treatment:                                                DATE: 04/11/24 Therapeutic Exercise: GYM program established for core and upper body. Chest press, Row, Lat Pulldown, Tricep pressdown, Bicep curls, Plynthe (tilt, tilt with march). All written on gym paper and filed in gym. Pt able to adjust seat and weight safely.   REEVAL DONE  Treatment                            6/19: Blank lines following charge title = not provided on this treatment date.   Manual:  TPDN No  There-ex: Gym: leg press, leg curl, leg extension, hip abd/add red band, biceps curls, UE abd to 90 There-Act:  Self Care:  Nuro-Re-ed:  Gait Training:    LAST VISIT: Nustep L6 x 5 mins (seat 10) Supine lumbar rotation x 10  Supine PPT 2x10 5 hold Side steping with RTB at ankles x2laps at rail (cues for posture and hip orientation) Hip flexor stretch standing 20sec x2ea Seated lumbar flexion stretch 5x10sec Seated fig 4 stretch 30sec x1ea PPT against wall 5 2x10 Sit to stands from table TrA 2x10 Step ups with cues for glute activation 6 x10ea  PATIENT EDUCATION:  Education details: reviewed HEP; gym program.  exercise form.  Positioning and set up of gym equipment.  Educated pt concerning  POC.  Person educated: Patient Education method: Medical illustrator Education comprehension: verbalized understanding and returned demonstration  HOME EXERCISE PROGRAM: Access Code: [email or web address] URL: [email or web address]   ASSESSMENT:  CLINICAL IMPRESSION:  Pt is improving with activity tolerance as evidenced by performance and progression of exercises.  PT educated pt in proper set up of gym equipment.  PT instructed pt in correct form and positioning and also appropriate frequency of gym exercises.  Pt tolerated gym exercises well having no c/o's.  Pt is progressing well toward goals.  She responded well to Rx having no c/o's and no increased pain after Rx.  Pt has 1 more approved MCD visit.  Pt planning to try aquatic therapy next visit.  She would benefit from continued PT including a combination of aquatic and land based therapy to improve core/LE strength, functional mobility, and to establish final HEP/gym program.      OBJECTIVE IMPAIRMENTS: decreased activity tolerance, difficulty walking, decreased balance, decreased endurance, decreased mobility, decreased ROM, decreased strength, impaired flexibility, impaired UE/LE use, postural dysfunction, and pain.  ACTIVITY LIMITATIONS: bending, lifting, carry, locomotion, cleaning, community activity, driving, and or occupation  PERSONAL FACTORS:  Arthritis, DM II, HTN are also affecting patient's functional outcome.  REHAB POTENTIAL: Good  CLINICAL DECISION MAKING: Stable/uncomplicated  EVALUATION COMPLEXITY: Low    GOALS: Short term PT Goals Target date: 01/09/2024 Pt will be I and compliant with HEP. Baseline:  Goal status: ONGOING Pt will decrease pain by 25% overall Baseline: Goal status: GOAL MET 5/8  Long term PT goals Target date: 05/07/24 Pt will improve ROM to [facility] to improve functional mobility Baseline: Goal status: MET Pt will improve  hip/knee strength to at least 4+/5 MMT to improve  functional strength Baseline: Goal status: MET Pt will improve OSWESTRY to at least 11% functional to show improved function Baseline: Goal status: ongoing Pt will reduce pain by overall 50% overall with usual activity Baseline: Goal status: MET Pt will be able to negotiate stairs with a reciprocal gait pattern with use of 1x hand rail if needed. Baseline: Goal status: ongoing Pt will be able to ambulate community distances at least 1000 ft WNL gait pattern without complaints Baseline: Goal status: MET     7. Pt will improve her STS by 2.3 seconds for Minimal clinically important difference.         Baseline:          Goal status: MET     8.  Pt will report at least a 50% improvement in standing tolerance.     Goal status:  MET     9. Pt will be independent in [employer] program in prep for discharge.    Goal Status: Ongoing 10. Pt will report >/=75% improvement in bil LE eccentric control in descending stairs to decrease falls risk.  Goal status: unmet  PLAN: PT FREQUENCY: 1-2times per week   PT DURATION: 4 weeks  PLANNED INTERVENTIONS (unless contraindicated): aquatic PT, cryotherapy, Electrical stimulation, Moist heat, Ultrasound, gait training, Therapeutic exercise, neuromuscular re-education, patient/family education,  manual techniques, theract, self care  PLAN FOR NEXT SESSION: continue to progress functional mobility, stair training, strengthen proximal hip muscles and core, review gym program as needed  For all possible CPT codes, reference the Planned Interventions line above.     Check all conditions that are expected to impact treatment: {  Conditions expected to impact treatment:Morbid obesity and Diabetes mellitus   If treatment provided at initial evaluation, no treatment charged due to lack of authorization.    Leigh Minerva III PT, DPT 04/19/24 4:37 PM

## 2024-04-24 ENCOUNTER — Encounter (HOSPITAL_BASED_OUTPATIENT_CLINIC_OR_DEPARTMENT_OTHER): Payer: Self-pay | Admitting: Physical Therapy

## 2024-04-24 ENCOUNTER — Ambulatory Visit (HOSPITAL_BASED_OUTPATIENT_CLINIC_OR_DEPARTMENT_OTHER): Admitting: Physical Therapy

## 2024-04-24 DIAGNOSIS — M5459 Other low back pain: Secondary | ICD-10-CM

## 2024-04-24 DIAGNOSIS — R2689 Other abnormalities of gait and mobility: Secondary | ICD-10-CM

## 2024-04-24 DIAGNOSIS — M6281 Muscle weakness (generalized): Secondary | ICD-10-CM | POA: Diagnosis not present

## 2024-04-24 NOTE — Therapy (Signed)
 OUTPATIENT PHYSICAL THERAPY THORACOLUMBAR TREATMENT   Patient Name: Jordan Ramirez MRN: [id number] DOB:[birth year], 63 y.o., female Today's Date: 04/24/2024  END OF SESSION:  PT End of Session - 04/24/24 1447     Visit Number 13    Number of Visits 13    Date for PT Re-Evaluation 05/07/24    Authorization Type [place] MEDICAID PREPAID HEALTH PLAN    Authorization Time Period 6/27-7/26    Authorization - Visit Number 3    Authorization - Number of Visits 3    PT Start Time 1447    PT Stop Time 1525    PT Time Calculation (min) 38 min          Past Medical History:  Diagnosis Date   Anemia    Anxiety    Arthritis    Asthma    Chronic low back pain    Diabetes mellitus without complication (HCC)    Fibroid    Hypertension    Neuromuscular disorder [facility]) July 2019   Treatment by Orthopedic specialist   Obesity    Routine general medical examination at a health care facility 07/13/2023   Past Surgical History:  Procedure Laterality Date   PILONIDAL CYST EXCISION     WISDOM TOOTH EXTRACTION     Patient Active Problem List   Diagnosis Date Noted   Chest pain 01/19/2024   Type 2 diabetes mellitus with obesity (HCC) 01/19/2024   Nonspecific chest pain 01/19/2024   Chest pressure 01/18/2024   Nausea and vomiting 01/18/2024   Transaminitis 01/18/2024   Routine general medical examination at a health care facility 07/13/2023   Peripheral polyneuropathy 05/25/2022   Chronic bilateral low back pain 05/25/2022   Mixed incontinence urge and stress 02/13/2020   Bilateral lower extremity edema 02/13/2020   Ganglion cyst of dorsum of right wrist 01/16/2018   Bilateral knee pain 04/17/2012   Pain of left heel 04/17/2012   Diabetes mellitus type 2 with neurological manifestations (HCC) 08/28/2009   THUMB PAIN, LEFT 08/27/2009   Hyperlipemia 08/20/2009   Morbid obesity (HCC)-BMI 37.4. Co morbilities HTN,DM II,HLD,OA-urine incontinence. 08/20/2009   ANEMIA-NOS 08/20/2009    Essential hypertension 08/20/2009   Allergic rhinitis 08/20/2009   Asthma 08/20/2009   MENORRHAGIA, PERIMENOPAUSAL 08/20/2009   DEGENERATIVE JOINT DISEASE, KNEE 08/20/2009   TENDINITIS, LEFT THUMB 08/20/2009    PCP: [place], Betty G, MD   REFERRING PROVIDER: [place], Betty G, MD   REFERRING DIAG:  Chronic right-sided low back pain, unspecified whether sciatica present [M54.50, G89.29]   Rationale for Evaluation and Treatment: Rehabilitation  THERAPY DIAG:  Other low back pain  Muscle weakness (generalized)  Other abnormalities of gait and mobility  ONSET DATE: 2019 started with intermittent flare up's since.   SUBJECTIVE:  SUBJECTIVE STATEMENT: Pt reports she is pleased with progress thus far.  Pt reports she feels she will soon be ready to discharge and continue exercise on own.   She reports she does not know how to swim, and has a fear of losing footing in water.   PERTINENT HISTORY:  Grade 1 anterolisthesis Arthritis, DM II, HTN  PAIN:  0/10   PRECAUTIONS: None  RED FLAGS: None   WEIGHT BEARING RESTRICTIONS: No  FALLS:  Has patient fallen in last 6 months? No  LIVING ENVIRONMENT: Lives with: lives alone Lives in: House/apartment Stairs: Yes: Internal: 1 flight steps; on right going up and External: 2-3 steps; on right going up Has following equipment at home: None  OCCUPATION: Unemployed   PLOF: Independent  PATIENT GOALS: Pt would like to be able to move without continued pain.   NEXT MD VISIT: None scheduled to date.   OBJECTIVE:  Note: Objective measures were completed at Evaluation unless otherwise noted.  DIAGNOSTIC FINDINGS:  1. Progressive degenerative disc disease and facet hypertrophy in the lower lumbar spine. 2. Grade 1 anterolisthesis of L4 on L5,  slightly increased from 2019. 3. Degenerative change of the sacroiliac joints, also progressed.  Treatment:  04/18/24  Nustep L6 x 5 mins UE/Les Sit to stands with TrA 2x10 Cybex Leg press 50# x15, 60# 2x10 LF Knee extension 15# 3x10 Standing cable rows with TrA 5# 2x10, 7.5# x10 LF seated HS curl 40# 3x10  See below for pt education    [facility] Adult PT Treatment:                                             Date: 04/24/24 Pt seen for aquatic therapy today.  Treatment took place in water 3.5-4.75 ft in depth at the [facility] pool. Temp of water was 91.  Pt entered/exited the pool via stairs independent with bilat rail.  - UE on barbell walking forward next to wall->side stepping - relaxed squat at wall  - in 4+ feet with UE on wall:  heel raises x 5, single heel raises x 5, hip abdct/ add 2 x 5; marching in place;  alternating hamstring curls x 5 each -> single hand on wall:  walking forward/ backward with cues for heel/toe and toe/heel - relaxed squat at wall  - UE on barbell:  walking forward across width of pool x 2  Pt requires the buoyancy and hydrostatic pressure of water for support, and to offload joints by unweighting joint load by at least 50 % in navel deep water and by at least 75-80% in chest to neck deep water.  Viscosity of the water is needed for resistance of strengthening. Water current perturbations provides challenge to standing balance requiring increased core activation.    [facility] Adult PT Treatment:                                                DATE: 04/13/24 Therapeutic Exercise: UBE level 4 x 5 min posterior Gym program review; pt was able to return demo all with good technique but needed max verbal cues for breathing during pelvic tilt   [facility] Adult PT Treatment:  DATE: 04/11/24 Therapeutic Exercise: GYM program established for core and upper body. Chest press, Row, Lat Pulldown, Tricep pressdown, Bicep  curls, Plynthe (tilt, tilt with march). All written on gym paper and filed in gym. Pt able to adjust seat and weight safely.   REEVAL DONE  Treatment                            6/19:  There-ex: Gym: leg press, leg curl, leg extension, hip abd/add red band, biceps curls, UE abd to 90                                                                                                                       PATIENT EDUCATION:  Education details: intro to aquatic therapy.  Person educated: Patient Education method: Medical illustrator Education comprehension: verbalized understanding and returned demonstration  HOME EXERCISE PROGRAM: Access Code: [email or web address] URL: [email or web address]   ASSESSMENT:  CLINICAL IMPRESSION:  Pt remains fearful of water but was able to take direction from therapist on deck without difficulty.  Confidence improved throughout session and she was able to progress to walking across width of pool with UE on floatation with supervision.  No increase in pain during session.  She would benefit from continued PT including a combination of aquatic and land based therapy to improve core/LE strength, functional mobility, and to establish final HEP/gym program.   Anticipate D/C in next 3 visits, with finalizing HEPs. Pt has partially met her goals.    OBJECTIVE IMPAIRMENTS: decreased activity tolerance, difficulty walking, decreased balance, decreased endurance, decreased mobility, decreased ROM, decreased strength, impaired flexibility, impaired UE/LE use, postural dysfunction, and pain.  ACTIVITY LIMITATIONS: bending, lifting, carry, locomotion, cleaning, community activity, driving, and or occupation  PERSONAL FACTORS:  Arthritis, DM II, HTN are also affecting patient's functional outcome.  REHAB POTENTIAL: Good  CLINICAL DECISION MAKING: Stable/uncomplicated  EVALUATION COMPLEXITY: Low    GOALS: Short term PT Goals Target date: 01/09/2024 Pt  will be I and compliant with HEP. Baseline:  Goal status: ONGOING Pt will decrease pain by 25% overall Baseline: Goal status: GOAL MET 5/8  Long term PT goals Target date: 05/07/24 Pt will improve ROM to [facility] to improve functional mobility Baseline: Goal status: MET Pt will improve  hip/knee strength to at least 4+/5 MMT to improve functional strength Baseline: Goal status: MET Pt will improve OSWESTRY to at least 11% functional to show improved function Baseline: Goal status: ongoing Pt will reduce pain by overall 50% overall with usual activity Baseline: Goal status: MET Pt will be able to negotiate stairs with a reciprocal gait pattern with use of 1x hand rail if needed. Baseline: Goal status: ongoing Pt will be able to ambulate community distances at least 1000 ft WNL gait pattern without complaints Baseline: Goal status: MET     7. Pt will improve her STS by 2.3 seconds for Minimal clinically important difference.  Baseline:          Goal status: MET     8.  Pt will report at least a 50% improvement in standing tolerance.     Goal status:  MET     9. Pt will be independent in [employer] program in prep for discharge.    Goal Status: Ongoing 10. Pt will report >/=75% improvement in bil LE eccentric control in descending stairs to decrease falls risk.  Goal status: unmet  PLAN: PT FREQUENCY: 1-2times per week   PT DURATION: 4 weeks  PLANNED INTERVENTIONS (unless contraindicated): aquatic PT, cryotherapy, Electrical stimulation, Moist heat, Ultrasound, gait training, Therapeutic exercise, neuromuscular re-education, patient/family education,  manual techniques, theract, self care  PLAN FOR NEXT SESSION: continue to progress functional mobility, stair training, strengthen proximal hip muscles and core, review gym program as needed  For all possible CPT codes, reference the Planned Interventions line above.     Check all conditions that are expected to impact  treatment: {Conditions expected to impact treatment:Morbid obesity and Diabetes mellitus   If treatment provided at initial evaluation, no treatment charged due to lack of authorization.     Delon Aquas, PTA 04/24/24 4:17 PM [facility] [address] [place], [place], [address] Phone: [phone]   Fax:  [phone]

## 2024-04-26 ENCOUNTER — Ambulatory Visit (HOSPITAL_BASED_OUTPATIENT_CLINIC_OR_DEPARTMENT_OTHER): Admitting: Physical Therapy

## 2024-04-26 ENCOUNTER — Encounter (HOSPITAL_BASED_OUTPATIENT_CLINIC_OR_DEPARTMENT_OTHER): Payer: Self-pay | Admitting: Physical Therapy

## 2024-04-26 DIAGNOSIS — M5459 Other low back pain: Secondary | ICD-10-CM

## 2024-04-26 DIAGNOSIS — M6281 Muscle weakness (generalized): Secondary | ICD-10-CM | POA: Diagnosis not present

## 2024-04-26 DIAGNOSIS — R2689 Other abnormalities of gait and mobility: Secondary | ICD-10-CM | POA: Diagnosis not present

## 2024-04-26 NOTE — Therapy (Signed)
 OUTPATIENT PHYSICAL THERAPY THORACOLUMBAR TREATMENT / PROGRESS NOTE   Patient Name: Jordan Ramirez MRN: [id number] DOB:February 13, 1961, 63 y.o., female Today's Date: 04/27/2024  END OF SESSION:   PT End of Session - 04/26/2024    Visit Number 14   Number of Visits 20   Date for PT Re-Evaluation 05/31/2024   Authorization Type [employer]   PT Start Time  1321   PT Stop Time 1407   PT Time Calculation (min) 46 min   Activity Tolerance Patient tolerated treatment well   Behavior During Therapy  [facility] for tasks assessed/performed    Past Medical History:  Diagnosis Date   Anemia    Anxiety    Arthritis    Asthma    Chronic low back pain    Diabetes mellitus without complication (HCC)    Fibroid    Hypertension    Neuromuscular disorder [facility]) July 2019   Treatment by Orthopedic specialist   Obesity    Routine general medical examination at a health care facility 07/13/2023   Past Surgical History:  Procedure Laterality Date   PILONIDAL CYST EXCISION     WISDOM TOOTH EXTRACTION     Patient Active Problem List   Diagnosis Date Noted   Chest pain 01/19/2024   Type 2 diabetes mellitus with obesity (HCC) 01/19/2024   Nonspecific chest pain 01/19/2024   Chest pressure 01/18/2024   Nausea and vomiting 01/18/2024   Transaminitis 01/18/2024   Routine general medical examination at a health care facility 07/13/2023   Peripheral polyneuropathy 05/25/2022   Chronic bilateral low back pain 05/25/2022   Mixed incontinence urge and stress 02/13/2020   Bilateral lower extremity edema 02/13/2020   Ganglion cyst of dorsum of right wrist 01/16/2018   Bilateral knee pain 04/17/2012   Pain of left heel 04/17/2012   Diabetes mellitus type 2 with neurological manifestations (HCC) 08/28/2009   THUMB PAIN, LEFT 08/27/2009   Hyperlipemia 08/20/2009   Morbid obesity (HCC)-BMI 37.4. Co morbilities HTN,DM II,HLD,OA-urine incontinence. 08/20/2009   ANEMIA-NOS 08/20/2009   Essential  hypertension 08/20/2009   Allergic rhinitis 08/20/2009   Asthma 08/20/2009   MENORRHAGIA, PERIMENOPAUSAL 08/20/2009   DEGENERATIVE JOINT DISEASE, KNEE 08/20/2009   TENDINITIS, LEFT THUMB 08/20/2009    PCP: [place], Betty G, MD   REFERRING PROVIDER: [place], Betty G, MD   REFERRING DIAG:  Chronic right-sided low back pain, unspecified whether sciatica present [M54.50, G89.29]   Rationale for Evaluation and Treatment: Rehabilitation  THERAPY DIAG:  Other low back pain  Muscle weakness (generalized)  Other abnormalities of gait and mobility  ONSET DATE: 2019 started with intermittent flare up's since.   SUBJECTIVE:  SUBJECTIVE STATEMENT: Pt states she feels better.  Pt reports she was sore after prior aquatic treatment and tolerated aquatic exercises well.  Pt has performed stairs at home to get to her bonus room.  She has difficulty with descending stairs.  Pt reports 20-30% improvement in LE control with descending stairs.    PERTINENT HISTORY:  Grade 1 anterolisthesis Arthritis, DM II, HTN  PAIN:  0/10 current, 2/10 worst   PRECAUTIONS: None  RED FLAGS: None   WEIGHT BEARING RESTRICTIONS: No  FALLS:  Has patient fallen in last 6 months? No  LIVING ENVIRONMENT: Lives with: lives alone Lives in: House/apartment Stairs: Yes: Internal: 1 flight steps; on right going up and External: 2-3 steps; on right going up Has following equipment at home: None  OCCUPATION: Unemployed   PLOF: Independent  PATIENT GOALS: Pt would like to be able to move without continued pain.   NEXT MD VISIT: None scheduled to date.   OBJECTIVE:  Note: Objective measures were completed at Evaluation unless otherwise noted.  DIAGNOSTIC FINDINGS:  1. Progressive degenerative disc disease and facet  hypertrophy in the lower lumbar spine. 2. Grade 1 anterolisthesis of L4 on L5, slightly increased from 2019. 3. Degenerative change of the sacroiliac joints, also progressed.  Treatment:  04/26/24 LOWER EXTREMITY MMT:     MMT Right eval Left eval Right 5/8 Left 5/8 Right 7/10 Left 7/10  Hip flexion 4 3- P! 4+/5 4/5 5/5 4+/5  Hip extension -8 lacking - 8 lacking         Hip abduction            Hip adduction            Knee flexion 4 4 4/5 4+/5 4/5 5/5  Knee extension 4- 3+ P! 4/5 5/5 5/5 5/5   (Blank rows = not tested)  Stairs:  Pt ascended with a reciprocal gait with the rail.  Pt circumducted R LE with ascending due to decreased R knee flexion.  Pt descended the stairs with a step to gait with 1 rail and with a step through gait with bilat rails.    Modified Oswestry:  12=24%  Nustep L6 x 5 mins UE/Les Cybex leg press 60#  3x10 Cybex knee extension 15#  3x10 Cybex knee flexion 40#  3x10  04/18/24  Nustep L6 x 5 mins UE/Les Sit to stands with TrA 2x10 Cybex Leg press 50# x15, 60# 2x10 LF Knee extension 15# 3x10 Standing cable rows with TrA 5# 2x10, 7.5# x10 LF seated HS curl 40# 3x10  See below for pt education    [facility] Adult PT Treatment:                                             Date: 04/24/24 Pt seen for aquatic therapy today.  Treatment took place in water 3.5-4.75 ft in depth at the [facility] pool. Temp of water was 91.  Pt entered/exited the pool via stairs independent with bilat rail.  - UE on barbell walking forward next to wall->side stepping - relaxed squat at wall  - in 4+ feet with UE on wall:  heel raises x 5, single heel raises x 5, hip abdct/ add 2 x 5; marching in place;  alternating hamstring curls x 5 each -> single hand on wall:  walking forward/ backward with cues for heel/toe and toe/heel -  relaxed squat at wall  - UE on barbell:  walking forward across width of pool x 2  Pt requires the buoyancy and hydrostatic pressure of water  for support, and to offload joints by unweighting joint load by at least 50 % in navel deep water and by at least 75-80% in chest to neck deep water.  Viscosity of the water is needed for resistance of strengthening. Water current perturbations provides challenge to standing balance requiring increased core activation.    [facility] Adult PT Treatment:                                                DATE: 04/13/24 Therapeutic Exercise: UBE level 4 x 5 min posterior Gym program review; pt was able to return demo all with good technique but needed max verbal cues for breathing during pelvic tilt   [facility] Adult PT Treatment:                                                DATE: 04/11/24 Therapeutic Exercise: GYM program established for core and upper body. Chest press, Row, Lat Pulldown, Tricep pressdown, Bicep curls, Plynthe (tilt, tilt with march). All written on gym paper and filed in gym. Pt able to adjust seat and weight safely.   REEVAL DONE  Treatment                            6/19:  There-ex: Gym: leg press, leg curl, leg extension, hip abd/add red band, biceps curls, UE abd to 90                                                                                                                       PATIENT EDUCATION:  Education details: objective findings, goal progress, exercise form, POC, set up of gym equipment Person educated: Patient Education method: Explanation and Demonstration Education comprehension: verbalized understanding and returned demonstration  HOME EXERCISE PROGRAM: Access Code: [email or web address] URL: [email or web address]   ASSESSMENT:  CLINICAL IMPRESSION:  Pt is progressing well with pain, sx's, strength, and tolerance to activity.  She has been performing some aquatic therapy and tolerated aquatic exercises well.  She continues to have difficulty with descending stairs and reports 20-30% improvement in LE control with descending stairs.  PT observed pt with  peforming stairs and she circumducts R LE with ascending stairs and has decreased eccentric control with descending stairs.  Pt has increased ease when holding bilat rails with descending stairs and performed a step to gait pattern when only using 1 rail.  Pt demonstrates improved bilat hip flexion strength, R knee extension, and L knee flexion strength.  PT educated pt in set up  of gym equipement and proper positioning.  She tolerated gym exercises well.  Pt has met STG #2 and LTG's # 1,2,4,6,7,8.  Pt would benefit from continued skilled PT to ensure independence with gym program, home exercises and aquatic exercises and for pt have further visits with aquatic therapy.    OBJECTIVE IMPAIRMENTS: decreased activity tolerance, difficulty walking, decreased balance, decreased endurance, decreased mobility, decreased ROM, decreased strength, impaired flexibility, impaired UE/LE use, postural dysfunction, and pain.  ACTIVITY LIMITATIONS: bending, lifting, carry, locomotion, cleaning, community activity, driving, and or occupation  PERSONAL FACTORS:  Arthritis, DM II, HTN are also affecting patient's functional outcome.  REHAB POTENTIAL: Good  CLINICAL DECISION MAKING: Stable/uncomplicated  EVALUATION COMPLEXITY: Low    GOALS: Short term PT Goals Target date: 01/09/2024 Pt will be I and compliant with HEP. Baseline:  Goal status: ONGOING Pt will decrease pain by 25% overall Baseline: Goal status: GOAL MET 5/8  Long term PT goals Target date: 05/07/24 Pt will improve ROM to [facility] to improve functional mobility Baseline: Goal status: MET Pt will improve  hip/knee strength to at least 4+/5 MMT to improve functional strength Baseline: Goal status: MET Pt will improve OSWESTRY to at least 11% functional to show improved function Baseline: Goal status: ongoing Target date:  05/31/2024 Pt will reduce pain by overall 50% overall with usual activity Baseline: Goal status: MET Pt will be able to  negotiate stairs with a reciprocal gait pattern with use of 1x hand rail if needed. Baseline: Goal status: ongoing Target date:  05/31/2024 Pt will be able to ambulate community distances at least 1000 ft WNL gait pattern without complaints Baseline: Goal status: MET     7. Pt will improve her STS by 2.3 seconds for Minimal clinically important difference.         Baseline:          Goal status: MET     8.  Pt will report at least a 50% improvement in standing tolerance.     Goal status:  MET     9. Pt will be independent in [employer] program in prep for discharge.    Goal Status: Ongoing Target date:  05/31/2024 10. Pt will report >/=75% improvement in bil LE eccentric control in descending stairs to decrease falls risk.  Goal status: unmet Target date:  05/31/2024  PLAN: PT FREQUENCY: 1-2 times per week   PT DURATION: 5 weeks  PLANNED INTERVENTIONS (unless contraindicated): aquatic PT, cryotherapy, Electrical stimulation, Moist heat, Ultrasound, gait training, Therapeutic exercise, neuromuscular re-education, patient/family education,  manual techniques, theract, self care  PLAN FOR NEXT SESSION: continue to progress functional mobility, stair training, strengthen proximal hip muscles and core, review gym program as needed  For all possible CPT codes, reference the Planned Interventions line above.     Check all conditions that are expected to impact treatment: {Conditions expected to impact treatment:Morbid obesity and Diabetes mellitus   If treatment provided at initial evaluation, no treatment charged due to lack of authorization.     Leigh Minerva III PT, DPT 04/27/24 9:38 PM  [facility] [address] [place], [place], [address] Phone: [phone]   Fax:  [phone]

## 2024-05-01 ENCOUNTER — Ambulatory Visit (HOSPITAL_BASED_OUTPATIENT_CLINIC_OR_DEPARTMENT_OTHER): Admitting: Physical Therapy

## 2024-05-01 ENCOUNTER — Encounter (HOSPITAL_BASED_OUTPATIENT_CLINIC_OR_DEPARTMENT_OTHER): Payer: Self-pay | Admitting: Physical Therapy

## 2024-05-01 DIAGNOSIS — M6281 Muscle weakness (generalized): Secondary | ICD-10-CM | POA: Diagnosis not present

## 2024-05-01 DIAGNOSIS — M5459 Other low back pain: Secondary | ICD-10-CM

## 2024-05-01 DIAGNOSIS — R2689 Other abnormalities of gait and mobility: Secondary | ICD-10-CM

## 2024-05-01 NOTE — Therapy (Signed)
 OUTPATIENT PHYSICAL THERAPY THORACOLUMBAR TREATMENT    Patient Name: Jordan Ramirez MRN: [id number] DOB:01/08/1961, 63 y.o., female Today's Date: 05/01/2024  END OF SESSION:  PT End of Session - 05/01/24 1426     Visit Number 14    Number of Visits 20    Date for PT Re-Evaluation 05/31/24    Authorization Type [place] MEDICAID PREPAID HEALTH PLAN    Authorization Time Period visit #2/6 visits approved  From 07.10.2025 - 09.07.2025  Tracking #[id number]    Authorization - Visit Number 2    Authorization - Number of Visits 6    PT Start Time 1445    PT Stop Time 1525    PT Time Calculation (min) 40 min    Activity Tolerance Patient tolerated treatment well    Behavior During Therapy [facility] for tasks assessed/performed            Past Medical History:  Diagnosis Date   Anemia    Anxiety    Arthritis    Asthma    Chronic low back pain    Diabetes mellitus without complication (HCC)    Fibroid    Hypertension    Neuromuscular disorder [facility]) July 2019   Treatment by Orthopedic specialist   Obesity    Routine general medical examination at a health care facility 07/13/2023   Past Surgical History:  Procedure Laterality Date   PILONIDAL CYST EXCISION     WISDOM TOOTH EXTRACTION     Patient Active Problem List   Diagnosis Date Noted   Chest pain 01/19/2024   Type 2 diabetes mellitus with obesity (HCC) 01/19/2024   Nonspecific chest pain 01/19/2024   Chest pressure 01/18/2024   Nausea and vomiting 01/18/2024   Transaminitis 01/18/2024   Routine general medical examination at a health care facility 07/13/2023   Peripheral polyneuropathy 05/25/2022   Chronic bilateral low back pain 05/25/2022   Mixed incontinence urge and stress 02/13/2020   Bilateral lower extremity edema 02/13/2020   Ganglion cyst of dorsum of right wrist 01/16/2018   Bilateral knee pain 04/17/2012   Pain of left heel 04/17/2012   Diabetes mellitus type 2 with neurological manifestations (HCC) 08/28/2009    THUMB PAIN, LEFT 08/27/2009   Hyperlipemia 08/20/2009   Morbid obesity (HCC)-BMI 37.4. Co morbilities HTN,DM II,HLD,OA-urine incontinence. 08/20/2009   ANEMIA-NOS 08/20/2009   Essential hypertension 08/20/2009   Allergic rhinitis 08/20/2009   Asthma 08/20/2009   MENORRHAGIA, PERIMENOPAUSAL 08/20/2009   DEGENERATIVE JOINT DISEASE, KNEE 08/20/2009   TENDINITIS, LEFT THUMB 08/20/2009    PCP: [place], Betty G, MD   REFERRING PROVIDER: [place], Betty G, MD   REFERRING DIAG:  Chronic right-sided low back pain, unspecified whether sciatica present [M54.50, G89.29]   Rationale for Evaluation and Treatment: Rehabilitation  THERAPY DIAG:  Other low back pain  Muscle weakness (generalized)  Other abnormalities of gait and mobility  ONSET DATE: 2019 started with intermittent flare up's since.   SUBJECTIVE:  SUBJECTIVE STATEMENT: No pain.   Reports good toleration of previous session without excessive pain ofr fatigue.   PERTINENT HISTORY:  Grade 1 anterolisthesis Arthritis, DM II, HTN  PAIN:  0/10 current, 2/10 worst   PRECAUTIONS: None  RED FLAGS: None   WEIGHT BEARING RESTRICTIONS: No  FALLS:  Has patient fallen in last 6 months? No  LIVING ENVIRONMENT: Lives with: lives alone Lives in: House/apartment Stairs: Yes: Internal: 1 flight steps; on right going up and External: 2-3 steps; on right going up Has following equipment at home: None  OCCUPATION: Unemployed   PLOF: Independent  PATIENT GOALS: Pt would like to be able to move without continued pain.   NEXT MD VISIT: None scheduled to date.   OBJECTIVE:  Note: Objective measures were completed at Evaluation unless otherwise noted.  DIAGNOSTIC FINDINGS:  1. Progressive degenerative disc disease and facet hypertrophy in  the lower lumbar spine. 2. Grade 1 anterolisthesis of L4 on L5, slightly increased from 2019. 3. Degenerative change of the sacroiliac joints, also progressed.  Treatment: [facility] Adult PT Treatment:                                             Date: 05/01/24 Pt seen for aquatic therapy today.  Treatment took place in water 3.5-4.75 ft in depth at the [facility] pool. Temp of water was 91.  Pt entered/exited the pool via stairs independent with bilat rail.  - UE on barbell walking forward then side stepping 4 widths each across 3.8 ft - relaxed squat at wall between changing directions - in 4+ feet with UE on wall:  heel raises x 10; toe raises; hip abdct/ add 2 x 5;  alternating hamstring curls x 10 -squatted rest period -L stretch x 3->L stretch with hip hiking -standing ue support on wall other on HB hip circles cw & ccw x10 ea -seated on lift: LAQ; hip flex/ext; cycling -L stretch with hip hiking   Pt requires the buoyancy and hydrostatic pressure of water for support, and to offload joints by unweighting joint load by at least 50 % in navel deep water and by at least 75-80% in chest to neck deep water.  Viscosity of the water is needed for resistance of strengthening. Water current perturbations provides challenge to standing balance requiring increased core activation.   04/26/24 LOWER EXTREMITY MMT:     MMT Right eval Left eval Right 5/8 Left 5/8 Right 7/10 Left 7/10  Hip flexion 4 3- P! 4+/5 4/5 5/5 4+/5  Hip extension -8 lacking - 8 lacking         Hip abduction            Hip adduction            Knee flexion 4 4 4/5 4+/5 4/5 5/5  Knee extension 4- 3+ P! 4/5 5/5 5/5 5/5   (Blank rows = not tested)  Stairs:  Pt ascended with a reciprocal gait with the rail.  Pt circumducted R LE with ascending due to decreased R knee flexion.  Pt descended the stairs with a step to gait with 1 rail and with a step through gait with bilat rails.    Modified Oswestry:   12=24%  Nustep L6 x 5 mins UE/Les Cybex leg press 60#  3x10 Cybex knee extension 15#  3x10 Cybex knee flexion 40#  3x10  04/18/24  Nustep L6 x 5 mins UE/Les Sit to stands with TrA 2x10 Cybex Leg press 50# x15, 60# 2x10 LF Knee extension 15# 3x10 Standing cable rows with TrA 5# 2x10, 7.5# x10 LF seated HS curl 40# 3x10  See below for pt education    [facility] Adult PT Treatment:                                             Date: 04/24/24 Pt seen for aquatic therapy today.  Treatment took place in water 3.5-4.75 ft in depth at the [facility] pool. Temp of water was 91.  Pt entered/exited the pool via stairs independent with bilat rail.  - UE on barbell walking forward next to wall->side stepping - relaxed squat at wall  - in 4+ feet with UE on wall:  heel raises x 5, single heel raises x 5, hip abdct/ add 2 x 5; marching in place;  alternating hamstring curls x 5 each -> single hand on wall:  walking forward/ backward with cues for heel/toe and toe/heel - relaxed squat at wall  - UE on barbell:  walking forward across width of pool x 2  Pt requires the buoyancy and hydrostatic pressure of water for support, and to offload joints by unweighting joint load by at least 50 % in navel deep water and by at least 75-80% in chest to neck deep water.  Viscosity of the water is needed for resistance of strengthening. Water current perturbations provides challenge to standing balance requiring increased core activation.    [facility] Adult PT Treatment:                                                DATE: 04/13/24 Therapeutic Exercise: UBE level 4 x 5 min posterior Gym program review; pt was able to return demo all with good technique but needed max verbal cues for breathing during pelvic tilt   [facility] Adult PT Treatment:                                                DATE: 04/11/24 Therapeutic Exercise: GYM program established for core and upper body. Chest press, Row, Lat Pulldown, Tricep  pressdown, Bicep curls, Plynthe (tilt, tilt with march). All written on gym paper and filed in gym. Pt able to adjust seat and weight safely.   REEVAL DONE  Treatment                            6/19:  There-ex: Gym: leg press, leg curl, leg extension, hip abd/add red band, biceps curls, UE abd to 90  PATIENT EDUCATION:  Education details: objective findings, goal progress, exercise form, POC, set up of gym equipment Person educated: Patient Education method: Explanation and Demonstration Education comprehension: verbalized understanding and returned demonstration  HOME EXERCISE PROGRAM: Access Code: [email or web address] URL: [email or web address]   ASSESSMENT:  CLINICAL IMPRESSION:  Initially pt guarded submerged but was able to relax with VC and as session proceeds and she gains confidence in setting.  She is able to gain adequate stretch in LB and paraspinals in L stretch position with hip hiking reports 'hurt so good and decreased stiffness.  She has progressed with amb ability submerged, no LOB. Able to reach end range movement in hip abd.  She is provided multiple rest periods as well as demonstration and VC for execution of exercises.  Goals ongoing. SABRA  PN:Pt is progressing well with pain, sx's, strength, and tolerance to activity.  She has been performing some aquatic therapy and tolerated aquatic exercises well.  She continues to have difficulty with descending stairs and reports 20-30% improvement in LE control with descending stairs.  PT observed pt with peforming stairs and she circumducts R LE with ascending stairs and has decreased eccentric control with descending stairs.  Pt has increased ease when holding bilat rails with descending stairs and performed a step to gait pattern when only using 1 rail.  Pt demonstrates improved bilat hip flexion strength,  R knee extension, and L knee flexion strength.  PT educated pt in set up of gym equipement and proper positioning.  She tolerated gym exercises well.  Pt has met STG #2 and LTG's # 1,2,4,6,7,8.  Pt would benefit from continued skilled PT to ensure independence with gym program, home exercises and aquatic exercises and for pt have further visits with aquatic therapy.    OBJECTIVE IMPAIRMENTS: decreased activity tolerance, difficulty walking, decreased balance, decreased endurance, decreased mobility, decreased ROM, decreased strength, impaired flexibility, impaired UE/LE use, postural dysfunction, and pain.  ACTIVITY LIMITATIONS: bending, lifting, carry, locomotion, cleaning, community activity, driving, and or occupation  PERSONAL FACTORS:  Arthritis, DM II, HTN are also affecting patient's functional outcome.  REHAB POTENTIAL: Good  CLINICAL DECISION MAKING: Stable/uncomplicated  EVALUATION COMPLEXITY: Low    GOALS: Short term PT Goals Target date: 01/09/2024 Pt will be I and compliant with HEP. Baseline:  Goal status: ONGOING Pt will decrease pain by 25% overall Baseline: Goal status: GOAL MET 5/8  Long term PT goals Target date: 05/07/24 Pt will improve ROM to [facility] to improve functional mobility Baseline: Goal status: MET Pt will improve  hip/knee strength to at least 4+/5 MMT to improve functional strength Baseline: Goal status: MET Pt will improve OSWESTRY to at least 11% functional to show improved function Baseline: Goal status: ongoing Target date:  05/31/2024 Pt will reduce pain by overall 50% overall with usual activity Baseline: Goal status: MET Pt will be able to negotiate stairs with a reciprocal gait pattern with use of 1x hand rail if needed. Baseline: Goal status: ongoing Target date:  05/31/2024 Pt will be able to ambulate community distances at least 1000 ft WNL gait pattern without complaints Baseline: Goal status: MET     7. Pt will improve her STS by  2.3 seconds for Minimal clinically important difference.         Baseline:          Goal status: MET     8.  Pt will report at least a 50% improvement in standing tolerance.     Goal status:  MET  9. Pt will be independent in [employer] program in prep for discharge.    Goal Status: Ongoing Target date:  05/31/2024 10. Pt will report >/=75% improvement in bil LE eccentric control in descending stairs to decrease falls risk.  Goal status: unmet Target date:  05/31/2024  PLAN: PT FREQUENCY: 1-2 times per week   PT DURATION: 5 weeks  PLANNED INTERVENTIONS (unless contraindicated): aquatic PT, cryotherapy, Electrical stimulation, Moist heat, Ultrasound, gait training, Therapeutic exercise, neuromuscular re-education, patient/family education,  manual techniques, theract, self care  PLAN FOR NEXT SESSION: continue to progress functional mobility, stair training, strengthen proximal hip muscles and core, review gym program as needed  For all possible CPT codes, reference the Planned Interventions line above.     Check all conditions that are expected to impact treatment: {Conditions expected to impact treatment:Morbid obesity and Diabetes mellitus   If treatment provided at initial evaluation, no treatment charged due to lack of authorization.     [address] [place]) Luca Dyar MPT 05/01/24 3:20 PM [facility] [address] [place], [place], [address] Phone: [phone]   Fax:  [phone]

## 2024-05-02 DIAGNOSIS — F4321 Adjustment disorder with depressed mood: Secondary | ICD-10-CM | POA: Diagnosis not present

## 2024-05-03 ENCOUNTER — Ambulatory Visit (HOSPITAL_BASED_OUTPATIENT_CLINIC_OR_DEPARTMENT_OTHER): Admitting: Physical Therapy

## 2024-05-03 DIAGNOSIS — M5459 Other low back pain: Secondary | ICD-10-CM | POA: Diagnosis not present

## 2024-05-03 DIAGNOSIS — R2689 Other abnormalities of gait and mobility: Secondary | ICD-10-CM

## 2024-05-03 DIAGNOSIS — M6281 Muscle weakness (generalized): Secondary | ICD-10-CM | POA: Diagnosis not present

## 2024-05-03 NOTE — Therapy (Incomplete)
 OUTPATIENT PHYSICAL THERAPY THORACOLUMBAR TREATMENT    Patient Name: Jordan Ramirez MRN: [id number] DOB:01-13-1961, 63 y.o., female Today's Date: 05/04/2024  END OF SESSION:  PT End of Session - 05/03/24 1455     Visit Number 15    Number of Visits 20    Date for PT Re-Evaluation 05/31/24    Authorization Type [place] MEDICAID [employer]PAID HE[employer] PLAN    Authorization Time Period 04/26/24 - 06/24/2024    Authorization - Number of Visits 6    PT Start Time 1407    PT Stop Time 1451    PT Time Calculation (min) 44 min    Activity Tolerance Patient tolerated treatment well    Behavior During Therapy [facility] for tasks assessed/performed             Past Medical History:  Diagnosis Date   Anemia    Anxiety    Arthritis    Asthma    Chronic low back pain    Diabetes mellitus without complication (HCC)    Fibroid    Hypertension    Neuromuscular disorder [facility]) July 2019   Treatment by Orthopedic specialist   Obesity    Routine general medical examination at a health care facility 07/13/2023   Past Surgical History:  Procedure Laterality Date   PILONIDAL CYST EXCISION     WISDOM TOOTH EXTRACTION     Patient Active Problem List   Diagnosis Date Noted   Chest pain 01/19/2024   Type 2 diabetes mellitus with obesity (HCC) 01/19/2024   Nonspecific chest pain 01/19/2024   Chest pressure 01/18/2024   Nausea and vomiting 01/18/2024   Transaminitis 01/18/2024   Routine general medical examination at a health care facility 07/13/2023   Peripheral polyneuropathy 05/25/2022   Chronic bilateral low back pain 05/25/2022   Mixed incontinence urge and stress 02/13/2020   Bilateral lower extremity edema 02/13/2020   Ganglion cyst of dorsum of right wrist 01/16/2018   Bilateral knee pain 04/17/2012   Pain of left heel 04/17/2012   Diabetes mellitus type 2 with neurological manifestations (HCC) 08/28/2009   THUMB PAIN, LEFT 08/27/2009   Hyperlipemia 08/20/2009   Morbid obesity (HCC)-BMI 37.4.  Co morbilities HTN,DM II,HLD,OA-urine incontinence. 08/20/2009   ANEMIA-NOS 08/20/2009   Essential hypertension 08/20/2009   Allergic rhinitis 08/20/2009   Asthma 08/20/2009   MENORRHAGIA, PERIMENOPAUSAL 08/20/2009   DEGENERATIVE JOINT DISEASE, KNEE 08/20/2009   TENDINITIS, LEFT THUMB 08/20/2009    PCP: [place], Betty G, MD   REFERRING PROVIDER: [place], Betty G, MD   REFERRING DIAG:  Chronic right-sided low back pain, unspecified whether sciatica present [M54.50, G89.29]   Rationale for Evaluation and Treatment: Rehabilitation  THERAPY DIAG:  Other low back pain  Muscle weakness (generalized)  Other abnormalities of gait and mobility  ONSET DATE: 2019 started with intermittent flare up's since.   SUBJECTIVE:  SUBJECTIVE STATEMENT: Pt denies any adverse effects after prior Rx.  Pt states aquatic therapy has been a blessing.  She denies pain currently.     PERTINENT HISTORY:  Grade 1 anterolisthesis Arthritis, DM II, HTN  PAIN:  0/10 current, 2/10 worst   PRECAUTIONS: None  RED FLAGS: None   WEIGHT BEARING RESTRICTIONS: No  FALLS:  Has patient fallen in last 6 months? No  LIVING ENVIRONMENT: Lives with: lives alone Lives in: House/apartment Stairs: Yes: Internal: 1 flight steps; on right going up and External: 2-3 steps; on right going up Has following equipment at home: None  OCCUPATION: Unemployed   PLOF: Independent  PATIENT GOALS: Pt would like to be able to move without continued pain.   NEXT MD VISIT: None scheduled to date.   OBJECTIVE:  Note: Objective measures were completed at Evaluation unless otherwise noted.  DIAGNOSTIC FINDINGS:  1. Progressive degenerative disc disease and facet hypertrophy in the lower lumbar spine. 2. Grade 1 anterolisthesis of L4  on L5, slightly increased from 2019. 3. Degenerative change of the sacroiliac joints, also progressed.  Treatment:  05/03/2024: Nustep lvl 6 UE/LE's Sit to stands with TrA 2x10 Supine bridge with TrA 2x10  Supine marching with PPT 2x10 Supine alt UE/LE with PPT x10 Cybex leg press 60# 2x10, 70# x10 Standing cable rows with TrA 7.5# x10, 10# 2x10 LF knee extension 15#  3x10 Seated hip abd 35# x10, 45# 2x10   [facility] Adult PT Treatment:                                             Date: 05/01/24 Pt seen for aquatic therapy today.  Treatment took place in water 3.5-4.75 ft in depth at the [facility] pool. Temp of water was 91.  Pt entered/exited the pool via stairs independent with bilat rail.  - UE on barbell walking forward then side stepping 4 widths each across 3.8 ft - relaxed squat at wall between changing directions - in 4+ feet with UE on wall:  heel raises x 10; toe raises; hip abdct/ add 2 x 5;  alternating hamstring curls x 10 -squatted rest period -L stretch x 3->L stretch with hip hiking -standing ue support on wall other on HB hip circles cw & ccw x10 ea -seated on lift: LAQ; hip flex/ext; cycling -L stretch with hip hiking   Pt requires the buoyancy and hydrostatic pressure of water for support, and to offload joints by unweighting joint load by at least 50 % in navel deep water and by at least 75-80% in chest to neck deep water.  Viscosity of the water is needed for resistance of strengthening. Water current perturbations provides challenge to standing balance requiring increased core activation.   04/26/24 LOWER EXTREMITY MMT:     MMT Right eval Left eval Right 5/8 Left 5/8 Right 7/10 Left 7/10  Hip flexion 4 3- P! 4+/5 4/5 5/5 4+/5  Hip extension -8 lacking - 8 lacking         Hip abduction            Hip adduction            Knee flexion 4 4 4/5 4+/5 4/5 5/5  Knee extension 4- 3+ P! 4/5 5/5 5/5 5/5   (Blank rows = not tested)  Stairs:  Pt  ascended with a reciprocal gait with  the rail.  Pt circumducted R LE with ascending due to decreased R knee flexion.  Pt descended the stairs with a step to gait with 1 rail and with a step through gait with bilat rails.    Modified Oswestry:  12=24%  Nustep L6 x 5 mins UE/Les Cybex leg press 60#  3x10 Cybex knee extension 15#  3x10 Cybex knee flexion 40#  3x10  04/18/24  Nustep L6 x 5 mins UE/Les Sit to stands with TrA 2x10 Cybex Leg press 50# x15, 60# 2x10 LF Knee extension 15# 3x10 Standing cable rows with TrA 5# 2x10, 7.5# x10 LF seated HS curl 40# 3x10  See below for pt education    [facility] Adult PT Treatment:                                             Date: 04/24/24 Pt seen for aquatic therapy today.  Treatment took place in water 3.5-4.75 ft in depth at the [facility] pool. Temp of water was 91.  Pt entered/exited the pool via stairs independent with bilat rail.  - UE on barbell walking forward next to wall->side stepping - relaxed squat at wall  - in 4+ feet with UE on wall:  heel raises x 5, single heel raises x 5, hip abdct/ add 2 x 5; marching in place;  alternating hamstring curls x 5 each -> single hand on wall:  walking forward/ backward with cues for heel/toe and toe/heel - relaxed squat at wall  - UE on barbell:  walking forward across width of pool x 2  Pt requires the buoyancy and hydrostatic pressure of water for support, and to offload joints by unweighting joint load by at least 50 % in navel deep water and by at least 75-80% in chest to neck deep water.  Viscosity of the water is needed for resistance of strengthening. Water current perturbations provides challenge to standing balance requiring increased core activation.    [facility] Adult PT Treatment:                                                DATE: 04/13/24 Therapeutic Exercise: UBE level 4 x 5 min posterior Gym program review; pt was able to return demo all with good technique but needed max verbal  cues for breathing during pelvic tilt   [facility] Adult PT Treatment:                                                DATE: 04/11/24 Therapeutic Exercise: GYM program established for core and upper body. Chest press, Row, Lat Pulldown, Tricep pressdown, Bicep curls, Plynthe (tilt, tilt with march). All written on gym paper and filed in gym. Pt able to adjust seat and weight safely.  PATIENT EDUCATION:  Education details: exercise form, HEP, POC, set up of gym equipment Person educated: Patient Education method: Medical illustrator Education comprehension: verbalized understanding and returned demonstration  HOME EXERCISE PROGRAM: Access Code: [email or web address] URL: [email or web address]   ASSESSMENT:  CLINICAL IMPRESSION:  Pt has enjoyed aquatic therapy and presents to Rx for a land based treatment today.  Pt is making good progress with exercise tolerance and is improving with strength as evidenced by performance of exercises.  PT worked on her gym program and educated pt in proper set up of gym machines and correct form.  PT also had pt perform some core exercises on table to progress her HEP.  Pt tolerated exercises well and responded well to Rx reporting no pain and having no c/o's after Rx.    OBJECTIVE IMPAIRMENTS: decreased activity tolerance, difficulty walking, decreased balance, decreased endurance, decreased mobility, decreased ROM, decreased strength, impaired flexibility, impaired UE/LE use, postural dysfunction, and pain.  ACTIVITY LIMITATIONS: bending, lifting, carry, locomotion, cleaning, community activity, driving, and or occupation  PERSONAL FACTORS:  Arthritis, DM II, HTN are also affecting patient's functional outcome.  REHAB POTENTIAL: Good  CLINICAL DECISION MAKING: Stable/uncomplicated  EVALUATION COMPLEXITY: Low    GOALS: Short  term PT Goals Target date: 01/09/2024 Pt will be I and compliant with HEP. Baseline:  Goal status: ONGOING Pt will decrease pain by 25% overall Baseline: Goal status: GOAL MET 5/8  Long term PT goals Target date: 05/07/24 Pt will improve ROM to [facility] to improve functional mobility Baseline: Goal status: MET Pt will improve  hip/knee strength to at least 4+/5 MMT to improve functional strength Baseline: Goal status: MET Pt will improve OSWESTRY to at least 11% functional to show improved function Baseline: Goal status: ongoing Target date:  05/31/2024 Pt will reduce pain by overall 50% overall with usual activity Baseline: Goal status: MET Pt will be able to negotiate stairs with a reciprocal gait pattern with use of 1x hand rail if needed. Baseline: Goal status: ongoing Target date:  05/31/2024 Pt will be able to ambulate community distances at least 1000 ft WNL gait pattern without complaints Baseline: Goal status: MET     7. Pt will improve her STS by 2.3 seconds for Minimal clinically important difference.         Baseline:          Goal status: MET     8.  Pt will report at least a 50% improvement in standing tolerance.     Goal status:  MET     9. Pt will be independent in [employer] program in prep for discharge.    Goal Status: Ongoing Target date:  05/31/2024 10. Pt will report >/=75% improvement in bil LE eccentric control in descending stairs to decrease falls risk.  Goal status: unmet Target date:  05/31/2024  PLAN: PT FREQUENCY: 1-2 times per week   PT DURATION: 5 weeks  PLANNED INTERVENTIONS (unless contraindicated): aquatic PT, cryotherapy, Electrical stimulation, Moist heat, Ultrasound, gait training, Therapeutic exercise, neuromuscular re-education, patient/family education,  manual techniques, theract, self care  PLAN FOR NEXT SESSION: continue to progress functional mobility, stair training, strengthen proximal hip muscles and core, review gym program as  needed.  Will try aquatic therapy next visit.   Leigh Minerva III PT, DPT 05/04/24 1:22 PM  [facility] [address] [place], [place], [address] Phone: [phone]   Fax:  [phone]

## 2024-05-04 ENCOUNTER — Encounter (HOSPITAL_BASED_OUTPATIENT_CLINIC_OR_DEPARTMENT_OTHER): Payer: Self-pay | Admitting: Physical Therapy

## 2024-05-14 NOTE — Therapy (Incomplete)
 OUTPATIENT PHYSICAL THERAPY THORACOLUMBAR TREATMENT    Patient Name: Jordan Ramirez MRN: [id number] DOB:07-18-1961, 63 y.o., female Today's Date: 05/14/2024  END OF SESSION:       Past Medical History:  Diagnosis Date   Anemia    Anxiety    Arthritis    Asthma    Chronic low back pain    Diabetes mellitus without complication (HCC)    Fibroid    Hypertension    Neuromuscular disorder [facility]) July 2019   Treatment by Orthopedic specialist   Obesity    Routine general medical examination at a health care facility 07/13/2023   Past Surgical History:  Procedure Laterality Date   PILONIDAL CYST EXCISION     WISDOM TOOTH EXTRACTION     Patient Active Problem List   Diagnosis Date Noted   Chest pain 01/19/2024   Type 2 diabetes mellitus with obesity (HCC) 01/19/2024   Nonspecific chest pain 01/19/2024   Chest pressure 01/18/2024   Nausea and vomiting 01/18/2024   Transaminitis 01/18/2024   Routine general medical examination at a health care facility 07/13/2023   Peripheral polyneuropathy 05/25/2022   Chronic bilateral low back pain 05/25/2022   Mixed incontinence urge and stress 02/13/2020   Bilateral lower extremity edema 02/13/2020   Ganglion cyst of dorsum of right wrist 01/16/2018   Bilateral knee pain 04/17/2012   Pain of left heel 04/17/2012   Diabetes mellitus type 2 with neurological manifestations (HCC) 08/28/2009   THUMB PAIN, LEFT 08/27/2009   Hyperlipemia 08/20/2009   Morbid obesity (HCC)-BMI 37.4. Co morbilities HTN,DM II,HLD,OA-urine incontinence. 08/20/2009   ANEMIA-NOS 08/20/2009   Essential hypertension 08/20/2009   Allergic rhinitis 08/20/2009   Asthma 08/20/2009   MENORRHAGIA, PERIMENOPAUSAL 08/20/2009   DEGENERATIVE JOINT DISEASE, KNEE 08/20/2009   TENDINITIS, LEFT THUMB 08/20/2009    PCP: [place], Betty G, MD   REFERRING PROVIDER: [place], Betty G, MD   REFERRING DIAG:  Chronic right-sided low back pain, unspecified whether sciatica  present [M54.50, G89.29]   Rationale for Evaluation and Treatment: Rehabilitation  THERAPY DIAG:  No diagnosis found.  ONSET DATE: 2019 started with intermittent flare up's since.   SUBJECTIVE:                                                                                                                                                                                           SUBJECTIVE STATEMENT: Pt denies any adverse effects after prior Rx.  Pt states aquatic therapy has been a blessing.  She denies pain currently.     PERTINENT HISTORY:  Grade 1 anterolisthesis Arthritis, DM II, HTN  PAIN:  0/10 current, 2/10 worst  PRECAUTIONS: None  RED FLAGS: None   WEIGHT BEARING RESTRICTIONS: No  FALLS:  Has patient fallen in last 6 months? No  LIVING ENVIRONMENT: Lives with: lives alone Lives in: House/apartment Stairs: Yes: Internal: 1 flight steps; on right going up and External: 2-3 steps; on right going up Has following equipment at home: None  OCCUPATION: Unemployed   PLOF: Independent  PATIENT GOALS: Pt would like to be able to move without continued pain.   NEXT MD VISIT: None scheduled to date.   OBJECTIVE:  Note: Objective measures were completed at Evaluation unless otherwise noted.  DIAGNOSTIC FINDINGS:  1. Progressive degenerative disc disease and facet hypertrophy in the lower lumbar spine. 2. Grade 1 anterolisthesis of L4 on L5, slightly increased from 2019. 3. Degenerative change of the sacroiliac joints, also progressed.  Treatment:  05/15/2024:  Pt seen for aquatic therapy today.  Treatment took place in water 3.5-4.75 ft in depth at the [facility] pool. Temp of water was 91.  Pt entered/exited the pool via stairs independent with bilat rail.  - UE on barbell walking forward then side stepping 4 widths each across 3.8 ft - relaxed squat at wall between changing directions - in 4+ feet with UE on wall:  heel raises x 10; toe raises;  hip abdct/ add 2 x 5;  alternating hamstring curls x 10 -squatted rest period -L stretch x 3->L stretch with hip hiking -standing ue support on wall other on HB hip circles cw & ccw x10 ea -seated on lift: LAQ; hip flex/ext; cycling -L stretch with hip hiking   Pt requires the buoyancy and hydrostatic pressure of water for support, and to offload joints by unweighting joint load by at least 50 % in navel deep water and by at least 75-80% in chest to neck deep water.  Viscosity of the water is needed for resistance of strengthening. Water current perturbations provides challenge to standing balance requiring increased core activation.    05/03/2024: Nustep lvl 6 UE/LE's Sit to stands with TrA 2x10 Supine bridge with TrA 2x10  Supine marching with PPT 2x10 Supine alt UE/LE with PPT x10 Cybex leg press 60# 2x10, 70# x10 Standing cable rows with TrA 7.5# x10, 10# 2x10 LF knee extension 15#  3x10 Seated hip abd 35# x10, 45# 2x10   [facility] Adult PT Treatment:                                             Date: 05/01/24 Pt seen for aquatic therapy today.  Treatment took place in water 3.5-4.75 ft in depth at the [facility] pool. Temp of water was 91.  Pt entered/exited the pool via stairs independent with bilat rail.  - UE on barbell walking forward then side stepping 4 widths each across 3.8 ft - relaxed squat at wall between changing directions - in 4+ feet with UE on wall:  heel raises x 10; toe raises; hip abdct/ add 2 x 5;  alternating hamstring curls x 10 -squatted rest period -L stretch x 3->L stretch with hip hiking -standing ue support on wall other on HB hip circles cw & ccw x10 ea -seated on lift: LAQ; hip flex/ext; cycling -L stretch with hip hiking   Pt requires the buoyancy and hydrostatic pressure of water for support, and to offload joints by unweighting joint load by at least 50 % in  navel deep water and by at least 75-80% in chest to neck deep water.  Viscosity of  the water is needed for resistance of strengthening. Water current perturbations provides challenge to standing balance requiring increased core activation.   04/26/24 LOWER EXTREMITY MMT:     MMT Right eval Left eval Right 5/8 Left 5/8 Right 7/10 Left 7/10  Hip flexion 4 3- P! 4+/5 4/5 5/5 4+/5  Hip extension -8 lacking - 8 lacking         Hip abduction            Hip adduction            Knee flexion 4 4 4/5 4+/5 4/5 5/5  Knee extension 4- 3+ P! 4/5 5/5 5/5 5/5   (Blank rows = not tested)  Stairs:  Pt ascended with a reciprocal gait with the rail.  Pt circumducted R LE with ascending due to decreased R knee flexion.  Pt descended the stairs with a step to gait with 1 rail and with a step through gait with bilat rails.    Modified Oswestry:  12=24%  Nustep L6 x 5 mins UE/Les Cybex leg press 60#  3x10 Cybex knee extension 15#  3x10 Cybex knee flexion 40#  3x10  04/18/24  Nustep L6 x 5 mins UE/Les Sit to stands with TrA 2x10 Cybex Leg press 50# x15, 60# 2x10 LF Knee extension 15# 3x10 Standing cable rows with TrA 5# 2x10, 7.5# x10 LF seated HS curl 40# 3x10  See below for pt education    [facility] Adult PT Treatment:                                             Date: 04/24/24 Pt seen for aquatic therapy today.  Treatment took place in water 3.5-4.75 ft in depth at the [facility] pool. Temp of water was 91.  Pt entered/exited the pool via stairs independent with bilat rail.  - UE on barbell walking forward next to wall->side stepping - relaxed squat at wall  - in 4+ feet with UE on wall:  heel raises x 5, single heel raises x 5, hip abdct/ add 2 x 5; marching in place;  alternating hamstring curls x 5 each -> single hand on wall:  walking forward/ backward with cues for heel/toe and toe/heel - relaxed squat at wall  - UE on barbell:  walking forward across width of pool x 2  Pt requires the buoyancy and hydrostatic pressure of water for support, and to offload  joints by unweighting joint load by at least 50 % in navel deep water and by at least 75-80% in chest to neck deep water.  Viscosity of the water is needed for resistance of strengthening. Water current perturbations provides challenge to standing balance requiring increased core activation.    [facility] Adult PT Treatment:                                                DATE: 04/13/24 Therapeutic Exercise: UBE level 4 x 5 min posterior Gym program review; pt was able to return demo all with good technique but needed max verbal cues for breathing during pelvic tilt   [facility] Adult PT Treatment:  DATE: 04/11/24 Therapeutic Exercise: GYM program established for core and upper body. Chest press, Row, Lat Pulldown, Tricep pressdown, Bicep curls, Plynthe (tilt, tilt with march). All written on gym paper and filed in gym. Pt able to adjust seat and weight safely.                                                                                                                       PATIENT EDUCATION:  Education details: exercise form, HEP, POC, set up of gym equipment Person educated: Patient Education method: Medical illustrator Education comprehension: verbalized understanding and returned demonstration  HOME EXERCISE PROGRAM: Access Code: [email or web address] URL: [email or web address]   ASSESSMENT:  CLINICAL IMPRESSION:  Pt has enjoyed aquatic therapy and presents to Rx for a land based treatment today.  Pt is making good progress with exercise tolerance and is improving with strength as evidenced by performance of exercises.  PT worked on her gym program and educated pt in proper set up of gym machines and correct form.  PT also had pt perform some core exercises on table to progress her HEP.  Pt tolerated exercises well and responded well to Rx reporting no pain and having no c/o's after Rx.    OBJECTIVE IMPAIRMENTS: decreased activity  tolerance, difficulty walking, decreased balance, decreased endurance, decreased mobility, decreased ROM, decreased strength, impaired flexibility, impaired UE/LE use, postural dysfunction, and pain.  ACTIVITY LIMITATIONS: bending, lifting, carry, locomotion, cleaning, community activity, driving, and or occupation  PERSONAL FACTORS:  Arthritis, DM II, HTN are also affecting patient's functional outcome.  REHAB POTENTIAL: Good  CLINICAL DECISION MAKING: Stable/uncomplicated  EVALUATION COMPLEXITY: Low    GOALS: Short term PT Goals Target date: 01/09/2024 Pt will be I and compliant with HEP. Baseline:  Goal status: ONGOING Pt will decrease pain by 25% overall Baseline: Goal status: GOAL MET 5/8  Long term PT goals Target date: 05/07/24 Pt will improve ROM to [facility] to improve functional mobility Baseline: Goal status: MET Pt will improve  hip/knee strength to at least 4+/5 MMT to improve functional strength Baseline: Goal status: MET Pt will improve OSWESTRY to at least 11% functional to show improved function Baseline: Goal status: ongoing Target date:  05/31/2024 Pt will reduce pain by overall 50% overall with usual activity Baseline: Goal status: MET Pt will be able to negotiate stairs with a reciprocal gait pattern with use of 1x hand rail if needed. Baseline: Goal status: ongoing Target date:  05/31/2024 Pt will be able to ambulate community distances at least 1000 ft WNL gait pattern without complaints Baseline: Goal status: MET     7. Pt will improve her STS by 2.3 seconds for Minimal clinically important difference.         Baseline:          Goal status: MET     8.  Pt will report at least a 50% improvement in standing tolerance.     Goal status:  MET     9. Pt will  be independent in [employer] program in prep for discharge.    Goal Status: Ongoing Target date:  05/31/2024 10. Pt will report >/=75% improvement in bil LE eccentric control in descending stairs to  decrease falls risk.  Goal status: unmet Target date:  05/31/2024  PLAN: PT FREQUENCY: 1-2 times per week   PT DURATION: 5 weeks  PLANNED INTERVENTIONS (unless contraindicated): aquatic PT, cryotherapy, Electrical stimulation, Moist heat, Ultrasound, gait training, Therapeutic exercise, neuromuscular re-education, patient/family education,  manual techniques, theract, self care  PLAN FOR NEXT SESSION: continue to progress functional mobility, stair training, strengthen proximal hip muscles and core, review gym program as needed.  Will try aquatic therapy next visit.   Leigh Minerva III PT, DPT 05/14/24 10:10 PM  [facility] [address] [place], [place], [address] Phone: [phone]   Fax:  [phone]

## 2024-05-15 ENCOUNTER — Ambulatory Visit (HOSPITAL_BASED_OUTPATIENT_CLINIC_OR_DEPARTMENT_OTHER): Admitting: Physical Therapy

## 2024-05-18 ENCOUNTER — Ambulatory Visit (HOSPITAL_BASED_OUTPATIENT_CLINIC_OR_DEPARTMENT_OTHER): Attending: Family Medicine | Admitting: Physical Therapy

## 2024-05-18 ENCOUNTER — Encounter (HOSPITAL_BASED_OUTPATIENT_CLINIC_OR_DEPARTMENT_OTHER): Payer: Self-pay | Admitting: Physical Therapy

## 2024-05-18 DIAGNOSIS — R2689 Other abnormalities of gait and mobility: Secondary | ICD-10-CM | POA: Insufficient documentation

## 2024-05-18 DIAGNOSIS — M6281 Muscle weakness (generalized): Secondary | ICD-10-CM | POA: Diagnosis not present

## 2024-05-18 DIAGNOSIS — M5459 Other low back pain: Secondary | ICD-10-CM | POA: Diagnosis not present

## 2024-05-18 NOTE — Therapy (Signed)
 OUTPATIENT PHYSICAL THERAPY THORACOLUMBAR TREATMENT    Patient Name: Jordan Ramirez MRN: [id number] DOB:08-Mar-1961, 63 y.o., female Today's Date: 05/18/2024  END OF SESSION:  PT End of Session - 05/18/24 0902     Visit Number 16    Number of Visits 20    Date for PT Re-Evaluation 05/31/24    Authorization Type [place] MEDICAID PREPAID HEALTH PLAN    Authorization Time Period 04/26/24 - 06/24/2024    Authorization - Number of Visits 6    PT Start Time 0857    PT Stop Time 0939    PT Time Calculation (min) 42 min    Activity Tolerance Patient tolerated treatment well    Behavior During Therapy [facility] for tasks assessed/performed              Past Medical History:  Diagnosis Date   Anemia    Anxiety    Arthritis    Asthma    Chronic low back pain    Diabetes mellitus without complication (HCC)    Fibroid    Hypertension    Neuromuscular disorder [facility]) July 2019   Treatment by Orthopedic specialist   Obesity    Routine general medical examination at a health care facility 07/13/2023   Past Surgical History:  Procedure Laterality Date   PILONIDAL CYST EXCISION     WISDOM TOOTH EXTRACTION     Patient Active Problem List   Diagnosis Date Noted   Chest pain 01/19/2024   Type 2 diabetes mellitus with obesity (HCC) 01/19/2024   Nonspecific chest pain 01/19/2024   Chest pressure 01/18/2024   Nausea and vomiting 01/18/2024   Transaminitis 01/18/2024   Routine general medical examination at a health care facility 07/13/2023   Peripheral polyneuropathy 05/25/2022   Chronic bilateral low back pain 05/25/2022   Mixed incontinence urge and stress 02/13/2020   Bilateral lower extremity edema 02/13/2020   Ganglion cyst of dorsum of right wrist 01/16/2018   Bilateral knee pain 04/17/2012   Pain of left heel 04/17/2012   Diabetes mellitus type 2 with neurological manifestations (HCC) 08/28/2009   THUMB PAIN, LEFT 08/27/2009   Hyperlipemia 08/20/2009   Morbid obesity (HCC)-BMI 37.4.  Co morbilities HTN,DM II,HLD,OA-urine incontinence. 08/20/2009   ANEMIA-NOS 08/20/2009   Essential hypertension 08/20/2009   Allergic rhinitis 08/20/2009   Asthma 08/20/2009   MENORRHAGIA, PERIMENOPAUSAL 08/20/2009   DEGENERATIVE JOINT DISEASE, KNEE 08/20/2009   TENDINITIS, LEFT THUMB 08/20/2009    PCP: [place], Betty G, MD   REFERRING PROVIDER: [place], Betty G, MD   REFERRING DIAG:  Chronic right-sided low back pain, unspecified whether sciatica present [M54.50, G89.29]   Rationale for Evaluation and Treatment: Rehabilitation  THERAPY DIAG:  Other low back pain  Muscle weakness (generalized)  Other abnormalities of gait and mobility  ONSET DATE: 2019 started with intermittent flare up's since.   SUBJECTIVE:  SUBJECTIVE STATEMENT: Pt has been in [place] for a conference.  She did a lot of walking while at conference, but did not perform her HEP.  Pt stats she feels tired and stiff this AM, but denies pain currently.  Pt states she has had some pain in her biceps and shoulders which started 3 weeks ago.  Pt denies any adverse effects after prior Rx.  Pt states she didn't sleep well last night.    PERTINENT HISTORY:  Grade 1 anterolisthesis Arthritis, DM II, HTN  PAIN:  0/10 current, 2/10 worst   PRECAUTIONS: None  RED FLAGS: None   WEIGHT BEARING RESTRICTIONS: No  FALLS:  Has patient fallen in last 6 months? No  LIVING ENVIRONMENT: Lives with: lives alone Lives in: House/apartment Stairs: Yes: Internal: 1 flight steps; on right going up and External: 2-3 steps; on right going up Has following equipment at home: None  OCCUPATION: Unemployed   PLOF: Independent  PATIENT GOALS: Pt would like to be able to move without continued pain.   NEXT MD VISIT: None scheduled to date.    OBJECTIVE:  Note: Objective measures were completed at Evaluation unless otherwise noted.  DIAGNOSTIC FINDINGS:  1. Progressive degenerative disc disease and facet hypertrophy in the lower lumbar spine. 2. Grade 1 anterolisthesis of L4 on L5, slightly increased from 2019. 3. Degenerative change of the sacroiliac joints, also progressed.  Treatment:  05/18/2024: Nustep lvl 5-6 x 6 mins UE/LE's Supine bridge with TrA 2x10  Supine alt UE/LE with TrA 2x10, with PPT x10 Cybex leg press 70# 2x10 Standing cable rows with TrA 10# 2x10, setting 11 Standing cable extension with TrA 7.5#,  setting 11 Seated hip abd 45# 2x10, 55# x 10 Sit to stands with TrA x 10 reps Supine lumbar rotation x 10 reps  Manual piriformis stretch 2x30 sec bilat   05/03/2024: Nustep lvl 6 UE/LE's Sit to stands with TrA 2x10 Supine bridge with TrA 2x10  Supine marching with PPT 2x10 Supine alt UE/LE with PPT x10 Cybex leg press 60# 2x10, 70# x10 Standing cable rows with TrA 7.5# x10, 10# 2x10 LF knee extension 15#  3x10 Seated hip abd 35# x10, 45# 2x10   [facility] Adult PT Treatment:                                             Date: 05/01/24 Pt seen for aquatic therapy today.  Treatment took place in water 3.5-4.75 ft in depth at the [facility] pool. Temp of water was 91.  Pt entered/exited the pool via stairs independent with bilat rail.  - UE on barbell walking forward then side stepping 4 widths each across 3.8 ft - relaxed squat at wall between changing directions - in 4+ feet with UE on wall:  heel raises x 10; toe raises; hip abdct/ add 2 x 5;  alternating hamstring curls x 10 -squatted rest period -L stretch x 3->L stretch with hip hiking -standing ue support on wall other on HB hip circles cw & ccw x10 ea -seated on lift: LAQ; hip flex/ext; cycling -L stretch with hip hiking   Pt requires the buoyancy and hydrostatic pressure of water for support, and to offload joints by unweighting joint  load by at least 50 % in navel deep water and by at least 75-80% in chest to neck deep water.  Viscosity of the water  is needed for resistance of strengthening. Water current perturbations provides challenge to standing balance requiring increased core activation.                                                                                                                       PATIENT EDUCATION:  Education details: exercise form, HEP, POC, set up of gym equipment Person educated: Patient Education method: Medical illustrator, verbal cues Education comprehension: verbalized understanding and returned demonstration, verbal cues required  HOME EXERCISE PROGRAM: Access Code: [email or web address] URL: [email or web address]   ASSESSMENT:  CLINICAL IMPRESSION:  Pt performed core, LE, and postural strengthening today.  PT provided instruction and cuing for correct form and positioning including set up of gym machines.  Pt tolerated exercises well.  Pt performed lumbar rotation and received piriforms stretch to improve mobility and stiffness.  Pt was unable to perform supine self piriformis stretch due to tightness.  PT performed supine piriformis stretch manually.  She responded well to Rx stating she felt good and had no pain after Rx.  OBJECTIVE IMPAIRMENTS: decreased activity tolerance, difficulty walking, decreased balance, decreased endurance, decreased mobility, decreased ROM, decreased strength, impaired flexibility, impaired UE/LE use, postural dysfunction, and pain.  ACTIVITY LIMITATIONS: bending, lifting, carry, locomotion, cleaning, community activity, driving, and or occupation  PERSONAL FACTORS:  Arthritis, DM II, HTN are also affecting patient's functional outcome.  REHAB POTENTIAL: Good  CLINICAL DECISION MAKING: Stable/uncomplicated  EVALUATION COMPLEXITY: Low    GOALS: Short term PT Goals Target date: 01/09/2024 Pt will be I and compliant with  HEP. Baseline:  Goal status: ONGOING Pt will decrease pain by 25% overall Baseline: Goal status: GOAL MET 5/8  Long term PT goals Target date: 05/07/24 Pt will improve ROM to [facility] to improve functional mobility Baseline: Goal status: MET Pt will improve  hip/knee strength to at least 4+/5 MMT to improve functional strength Baseline: Goal status: MET Pt will improve OSWESTRY to at least 11% functional to show improved function Baseline: Goal status: ongoing Target date:  05/31/2024 Pt will reduce pain by overall 50% overall with usual activity Baseline: Goal status: MET Pt will be able to negotiate stairs with a reciprocal gait pattern with use of 1x hand rail if needed. Baseline: Goal status: ongoing Target date:  05/31/2024 Pt will be able to ambulate community distances at least 1000 ft WNL gait pattern without complaints Baseline: Goal status: MET     7. Pt will improve her STS by 2.3 seconds for Minimal clinically important difference.         Baseline:          Goal status: MET     8.  Pt will report at least a 50% improvement in standing tolerance.     Goal status:  MET     9. Pt will be independent in [employer] program in prep for discharge.    Goal Status: Ongoing Target date:  05/31/2024 10. Pt will report >/=75% improvement in bil LE eccentric control  in descending stairs to decrease falls risk.  Goal status: unmet Target date:  05/31/2024  PLAN: PT FREQUENCY: 1-2 times per week   PT DURATION: 5 weeks  PLANNED INTERVENTIONS (unless contraindicated): aquatic PT, cryotherapy, Electrical stimulation, Moist heat, Ultrasound, gait training, Therapeutic exercise, neuromuscular re-education, patient/family education,  manual techniques, theract, self care  PLAN FOR NEXT SESSION: continue to progress functional mobility, stair training, strengthen proximal hip muscles and core, review gym program as needed.  Possibly try aquatic therapy next visit.   Leigh Minerva III  PT, DPT 05/18/24 11:40 AM   [facility] [address] [place], [place], [address] Phone: [phone]   Fax:  [phone]

## 2024-05-30 ENCOUNTER — Encounter (HOSPITAL_BASED_OUTPATIENT_CLINIC_OR_DEPARTMENT_OTHER): Payer: Self-pay

## 2024-05-30 ENCOUNTER — Ambulatory Visit (HOSPITAL_BASED_OUTPATIENT_CLINIC_OR_DEPARTMENT_OTHER): Admitting: Physical Therapy

## 2024-06-04 DIAGNOSIS — H5213 Myopia, bilateral: Secondary | ICD-10-CM | POA: Diagnosis not present

## 2024-06-07 DIAGNOSIS — F4321 Adjustment disorder with depressed mood: Secondary | ICD-10-CM | POA: Diagnosis not present

## 2024-06-20 ENCOUNTER — Ambulatory Visit (HOSPITAL_BASED_OUTPATIENT_CLINIC_OR_DEPARTMENT_OTHER): Attending: Family Medicine | Admitting: Physical Therapy

## 2024-06-20 DIAGNOSIS — M6281 Muscle weakness (generalized): Secondary | ICD-10-CM | POA: Insufficient documentation

## 2024-06-20 DIAGNOSIS — R2689 Other abnormalities of gait and mobility: Secondary | ICD-10-CM | POA: Diagnosis present

## 2024-06-20 DIAGNOSIS — M5459 Other low back pain: Secondary | ICD-10-CM | POA: Diagnosis present

## 2024-06-20 NOTE — Therapy (Unsigned)
 OUTPATIENT PHYSICAL THERAPY THORACOLUMBAR TREATMENT    Patient Name: Jordan Ramirez MRN: [id number] DOB:1960/11/04, 63 y.o., female Today's Date: 06/21/2024  END OF SESSION:  PT End of Session - 06/21/24 1749     Visit Number 17    Number of Visits 20    Authorization Type [place] MEDICAID PREPAID HEALTH PLAN    Authorization Time Period 04/26/24 - 06/24/2024    Authorization - Number of Visits 6    PT Start Time 1619    PT Stop Time 1702    PT Time Calculation (min) 43 min    Activity Tolerance Patient tolerated treatment well    Behavior During Therapy [facility] for tasks assessed/performed               Past Medical History:  Diagnosis Date   Anemia    Anxiety    Arthritis    Asthma    Chronic low back pain    Diabetes mellitus without complication (HCC)    Fibroid    Hypertension    Neuromuscular disorder [facility]) July 2019   Treatment by Orthopedic specialist   Obesity    Routine general medical examination at a health care facility 07/13/2023   Past Surgical History:  Procedure Laterality Date   PILONIDAL CYST EXCISION     WISDOM TOOTH EXTRACTION     Patient Active Problem List   Diagnosis Date Noted   Chest pain 01/19/2024   Type 2 diabetes mellitus with obesity (HCC) 01/19/2024   Nonspecific chest pain 01/19/2024   Chest pressure 01/18/2024   Nausea and vomiting 01/18/2024   Transaminitis 01/18/2024   Routine general medical examination at a health care facility 07/13/2023   Peripheral polyneuropathy 05/25/2022   Chronic bilateral low back pain 05/25/2022   Mixed incontinence urge and stress 02/13/2020   Bilateral lower extremity edema 02/13/2020   Ganglion cyst of dorsum of right wrist 01/16/2018   Bilateral knee pain 04/17/2012   Pain of left heel 04/17/2012   Diabetes mellitus type 2 with neurological manifestations (HCC) 08/28/2009   THUMB PAIN, LEFT 08/27/2009   Hyperlipemia 08/20/2009   Morbid obesity (HCC)-BMI 37.4. Co morbilities HTN,DM  II,HLD,OA-urine incontinence. 08/20/2009   ANEMIA-NOS 08/20/2009   Essential hypertension 08/20/2009   Allergic rhinitis 08/20/2009   Asthma 08/20/2009   MENORRHAGIA, PERIMENOPAUSAL 08/20/2009   DEGENERATIVE JOINT DISEASE, KNEE 08/20/2009   TENDINITIS, LEFT THUMB 08/20/2009    PCP: [place], Betty G, MD   REFERRING PROVIDER: [place], Betty G, MD   REFERRING DIAG:  Chronic right-sided low back pain, unspecified whether sciatica present [M54.50, G89.29]   Rationale for Evaluation and Treatment: Rehabilitation  THERAPY DIAG:  Other low back pain  Muscle weakness (generalized)  Other abnormalities of gait and mobility  ONSET DATE: 2019 started with intermittent flare up's since.   SUBJECTIVE:  SUBJECTIVE STATEMENT: Pt accepted a position performing substituting teaching.  She is sore from increased standing at work.  Pt has just started a plant based diet with a detox tea which she will be doing for this month.   Pt denies any adverse effects after prior Rx.     PERTINENT HISTORY:  Grade 1 anterolisthesis Arthritis, DM II, HTN  PAIN:  3/10 current, 3/10 worst  Location:  central lumbar   PRECAUTIONS: None  RED FLAGS: None   WEIGHT BEARING RESTRICTIONS: No  FALLS:  Has patient fallen in last 6 months? No  LIVING ENVIRONMENT: Lives with: lives alone Lives in: House/apartment Stairs: Yes: Internal: 1 flight steps; on right going up and External: 2-3 steps; on right going up Has following equipment at home: None  OCCUPATION: Unemployed   PLOF: Independent  PATIENT GOALS: Pt would like to be able to move without continued pain.   NEXT MD VISIT: None scheduled to date.   OBJECTIVE:  Note: Objective measures were completed at Evaluation unless otherwise noted.  DIAGNOSTIC  FINDINGS:  1. Progressive degenerative disc disease and facet hypertrophy in the lower lumbar spine. 2. Grade 1 anterolisthesis of L4 on L5, slightly increased from 2019. 3. Degenerative change of the sacroiliac joints, also progressed.  Treatment:  06/19/2024 Manual Therapy:  STM to bilat lumbar paraspinals in prone  Nustep lvl 5 x 5 mins UE/LE's Standing cable extension with TrA 7.5# x10, 10# x10,  setting 11 Standing cable rows with TrA 10# 2x10, setting 12 Cybex leg press 70# 2x10 Seated hip abd 55# 2x10 Supine lumbar rotation x 10 reps  Manual piriformis stretch 2x30 sec bilat  05/18/2024: Nustep lvl 5-6 x 6 mins UE/LE's Supine bridge with TrA 2x10  Supine alt UE/LE with TrA 2x10, with PPT x10 Cybex leg press 70# 2x10 Standing cable rows with TrA 10# 2x10, setting 11 Standing cable extension with TrA 7.5#,  setting 11 Seated hip abd 45# 2x10, 55# x 10 Sit to stands with TrA x 10 reps Supine lumbar rotation x 10 reps  Manual piriformis stretch 2x30 sec bilat                                                                                                                       PATIENT EDUCATION:  Education details: exercise form, HEP, POC, set up of gym equipment Person educated: Patient Education method: Medical illustrator, verbal cues Education comprehension: verbalized understanding and returned demonstration, verbal cues required  HOME EXERCISE PROGRAM: Access Code: [email or web address] URL: [email or web address]   ASSESSMENT:  CLINICAL IMPRESSION:  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  Pt has been absent from PT due to clinic schedule availability and also her availability due to her job.  PT performed STM to improve soft tissue tightness and mobility and pain.  Pt responded well to [facility] reporting improved pain from 3/10 to 0/10 after STM.  PT instructed pt in correct form and positioning with exercises including gym based exercises.  She performed exercises well with cuing and instruction in correct form.  Pt responded well to treatment.  She would benefit from continued PT including aquatic and land based exercises.      OBJECTIVE IMPAIRMENTS: decreased activity tolerance, difficulty walking, decreased balance, decreased endurance, decreased mobility, decreased ROM, decreased strength, impaired flexibility, impaired UE/LE use, postural dysfunction, and pain.  ACTIVITY LIMITATIONS: bending, lifting, carry, locomotion, cleaning, community activity, driving, and or occupation  PERSONAL FACTORS:  Arthritis, DM II, HTN are also affecting patient's functional outcome.  REHAB POTENTIAL: Good  CLINICAL DECISION MAKING: Stable/uncomplicated  EVALUATION COMPLEXITY: Low    GOALS: Short term PT Goals Target date: 01/09/2024 Pt will be I and compliant with HEP. Baseline:  Goal status: ONGOING Pt will decrease pain by 25% overall Baseline: Goal status: GOAL MET 5/8  Long term PT goals Target date: 05/07/24 Pt will improve ROM to [facility] to improve functional mobility Baseline: Goal status: MET Pt will improve  hip/knee strength to at least 4+/5 MMT to improve functional strength Baseline: Goal status: MET Pt will improve OSWESTRY to at least 11% functional to show improved function Baseline: Goal status: ongoing Target date:  05/31/2024 Pt will reduce pain by overall 50% overall with usual activity Baseline: Goal status: MET Pt will be able to negotiate stairs with a reciprocal gait pattern with use of 1x hand rail if needed. Baseline: Goal status: ongoing Target date:  05/31/2024 Pt will be able to ambulate community distances at least 1000 ft WNL gait pattern without complaints Baseline: Goal status: MET     7. Pt will improve her STS by 2.3 seconds for Minimal clinically important difference.         Baseline:          Goal status: MET     8.  Pt will report at least a 50% improvement in standing tolerance.      Goal status:  MET     9. Pt will be independent in [employer] program in prep for discharge.    Goal Status: Ongoing Target date:  05/31/2024 10. Pt will report >/=75% improvement in bil LE eccentric control in descending stairs to decrease falls risk.  Goal status: unmet Target date:  05/31/2024  PLAN: PT FREQUENCY: 1-2 times per week   PT DURATION: 5 weeks  PLANNED INTERVENTIONS (unless contraindicated): aquatic PT, cryotherapy, Electrical stimulation, Moist heat, Ultrasound, gait training, Therapeutic exercise, neuromuscular re-education, patient/family education,  manual techniques, theract, self care  PLAN FOR NEXT SESSION: continue to progress functional mobility, stair training, strengthen proximal hip muscles and core, review gym program as needed.  Possibly try aquatic therapy next visit.  Work on independence with gym exercises.  PN next visit.   Leigh Minerva III PT, DPT 06/21/24 11:56 PM    [facility] [address] [place], [place], [address] Phone: [phone]   Fax:  [phone]

## 2024-06-21 ENCOUNTER — Encounter (HOSPITAL_BASED_OUTPATIENT_CLINIC_OR_DEPARTMENT_OTHER): Payer: Self-pay | Admitting: Physical Therapy

## 2024-06-25 ENCOUNTER — Ambulatory Visit (HOSPITAL_BASED_OUTPATIENT_CLINIC_OR_DEPARTMENT_OTHER): Admitting: Physical Therapy

## 2024-06-27 ENCOUNTER — Ambulatory Visit (HOSPITAL_BASED_OUTPATIENT_CLINIC_OR_DEPARTMENT_OTHER): Admitting: Physical Therapy

## 2024-07-02 ENCOUNTER — Ambulatory Visit (HOSPITAL_BASED_OUTPATIENT_CLINIC_OR_DEPARTMENT_OTHER)

## 2024-07-04 ENCOUNTER — Ambulatory Visit (HOSPITAL_BASED_OUTPATIENT_CLINIC_OR_DEPARTMENT_OTHER): Admitting: Physical Therapy

## 2024-07-09 ENCOUNTER — Encounter (HOSPITAL_BASED_OUTPATIENT_CLINIC_OR_DEPARTMENT_OTHER): Admitting: Physical Therapy

## 2024-07-11 ENCOUNTER — Ambulatory Visit (HOSPITAL_BASED_OUTPATIENT_CLINIC_OR_DEPARTMENT_OTHER): Admitting: Physical Therapy

## 2024-07-16 ENCOUNTER — Encounter (HOSPITAL_BASED_OUTPATIENT_CLINIC_OR_DEPARTMENT_OTHER): Admitting: Physical Therapy

## 2024-08-02 ENCOUNTER — Other Ambulatory Visit: Payer: Self-pay | Admitting: Family Medicine

## 2024-08-02 DIAGNOSIS — I10 Essential (primary) hypertension: Secondary | ICD-10-CM

## 2024-08-06 DIAGNOSIS — F4321 Adjustment disorder with depressed mood: Secondary | ICD-10-CM | POA: Diagnosis not present

## 2024-08-23 ENCOUNTER — Other Ambulatory Visit: Payer: Self-pay | Admitting: Family Medicine

## 2024-11-05 ENCOUNTER — Telehealth: Payer: Self-pay | Admitting: Family Medicine

## 2024-11-05 NOTE — Telephone Encounter (Signed)
 Copied from CRM [id number]. Topic: Referral - Request for Referral >> Nov 02, 2024  4:51 PM Winona R wrote: Did the patient discuss referral with their provider in the last year? Yes (If No - schedule appointment) (If Yes - send message)  Appointment offered? No  Type of order/referral and detailed reason for visit: pt would like to restart physical Therapy- lower back and knees   Preference of office, provider, location: [place] [facility]  If referral order, have you been seen by this specialty before? Yes (If Yes, this issue or another issue? When? Where?  Can we respond through MyChart? Yes

## 2024-11-05 NOTE — Telephone Encounter (Signed)
 We can discuss referral during her next follow up visit. Thanks, BJ

## 2024-11-05 NOTE — Telephone Encounter (Signed)
 Spoke with the patient and she was informed she needed to be seen. Sch appointment for 11/14/24 at 3pm.

## 2024-11-14 ENCOUNTER — Ambulatory Visit: Admitting: Family Medicine
